# Patient Record
Sex: Male | Born: 1960 | Race: White | Hispanic: No | Marital: Single | Smoking: Current every day smoker
Health system: Southern US, Community
[De-identification: ages and names within clinical notes are randomized; demographics above are authoritative.]

## PROBLEM LIST (undated history)

## (undated) DIAGNOSIS — E785 Hyperlipidemia, unspecified: Secondary | ICD-10-CM

## (undated) DIAGNOSIS — D473 Essential (hemorrhagic) thrombocythemia: Secondary | ICD-10-CM

## (undated) DIAGNOSIS — F1010 Alcohol abuse, uncomplicated: Secondary | ICD-10-CM

## (undated) DIAGNOSIS — D649 Anemia, unspecified: Secondary | ICD-10-CM

## (undated) DIAGNOSIS — I10 Essential (primary) hypertension: Secondary | ICD-10-CM

## (undated) DIAGNOSIS — D75839 Thrombocytosis, unspecified: Secondary | ICD-10-CM

## (undated) DIAGNOSIS — J189 Pneumonia, unspecified organism: Secondary | ICD-10-CM

## (undated) DIAGNOSIS — I480 Paroxysmal atrial fibrillation: Secondary | ICD-10-CM

## (undated) DIAGNOSIS — I499 Cardiac arrhythmia, unspecified: Secondary | ICD-10-CM

## (undated) DIAGNOSIS — W868XXA Exposure to other electric current, initial encounter: Secondary | ICD-10-CM

## (undated) HISTORY — DX: Paroxysmal atrial fibrillation: I48.0

## (undated) HISTORY — DX: Thrombocytosis, unspecified: D75.839

## (undated) HISTORY — DX: Anemia, unspecified: D64.9

## (undated) HISTORY — PX: SKIN GRAFT: SHX250

## (undated) HISTORY — DX: Essential (hemorrhagic) thrombocythemia: D47.3

## (undated) HISTORY — DX: Alcohol abuse, uncomplicated: F10.10

---

## 2008-02-22 ENCOUNTER — Ambulatory Visit: Payer: Self-pay | Admitting: Internal Medicine

## 2017-03-01 ENCOUNTER — Emergency Department: Payer: 59

## 2017-03-01 ENCOUNTER — Other Ambulatory Visit: Payer: Self-pay

## 2017-03-01 ENCOUNTER — Inpatient Hospital Stay
Admission: EM | Admit: 2017-03-01 | Discharge: 2017-03-04 | DRG: 871 | Disposition: A | Discharge status: Home / Self Care | Payer: 59 | Attending: Internal Medicine | Admitting: Internal Medicine

## 2017-03-01 DIAGNOSIS — J189 Pneumonia, unspecified organism: Secondary | ICD-10-CM | POA: Diagnosis present

## 2017-03-01 DIAGNOSIS — J690 Pneumonitis due to inhalation of food and vomit: Secondary | ICD-10-CM | POA: Diagnosis present

## 2017-03-01 DIAGNOSIS — A419 Sepsis, unspecified organism: Principal | ICD-10-CM | POA: Diagnosis present

## 2017-03-01 DIAGNOSIS — R740 Nonspecific elevation of levels of transaminase and lactic acid dehydrogenase [LDH]: Secondary | ICD-10-CM | POA: Diagnosis present

## 2017-03-01 DIAGNOSIS — E871 Hypo-osmolality and hyponatremia: Secondary | ICD-10-CM

## 2017-03-01 DIAGNOSIS — Y95 Nosocomial condition: Secondary | ICD-10-CM | POA: Diagnosis present

## 2017-03-01 DIAGNOSIS — F17210 Nicotine dependence, cigarettes, uncomplicated: Secondary | ICD-10-CM | POA: Diagnosis present

## 2017-03-01 DIAGNOSIS — E43 Unspecified severe protein-calorie malnutrition: Secondary | ICD-10-CM

## 2017-03-01 DIAGNOSIS — J181 Lobar pneumonia, unspecified organism: Secondary | ICD-10-CM

## 2017-03-01 DIAGNOSIS — Z681 Body mass index (BMI) 19 or less, adult: Secondary | ICD-10-CM | POA: Diagnosis not present

## 2017-03-01 LAB — CBC WITH DIFFERENTIAL/PLATELET
BASOS ABS: 0 10*3/uL (ref 0–0.1)
Basophils Relative: 0 %
EOS ABS: 0 10*3/uL (ref 0–0.7)
EOS PCT: 0 %
HCT: 42.7 % (ref 40.0–52.0)
HEMOGLOBIN: 14.4 g/dL (ref 13.0–18.0)
LYMPHS PCT: 6 %
Lymphs Abs: 0.6 10*3/uL — ABNORMAL LOW (ref 1.0–3.6)
MCH: 32.7 pg (ref 26.0–34.0)
MCHC: 33.7 g/dL (ref 32.0–36.0)
MCV: 97 fL (ref 80.0–100.0)
Monocytes Absolute: 0.5 10*3/uL (ref 0.2–1.0)
Monocytes Relative: 4 %
NEUTROS PCT: 90 %
Neutro Abs: 9.3 10*3/uL — ABNORMAL HIGH (ref 1.4–6.5)
PLATELETS: 572 10*3/uL — AB (ref 150–440)
RBC: 4.4 MIL/uL (ref 4.40–5.90)
RDW: 13.3 % (ref 11.5–14.5)
WBC: 10.4 10*3/uL (ref 3.8–10.6)

## 2017-03-01 LAB — URINALYSIS, COMPLETE (UACMP) WITH MICROSCOPIC
Bacteria, UA: NONE SEEN
Bilirubin Urine: NEGATIVE
GLUCOSE, UA: NEGATIVE mg/dL
HGB URINE DIPSTICK: NEGATIVE
KETONES UR: NEGATIVE mg/dL
LEUKOCYTES UA: NEGATIVE
Nitrite: NEGATIVE
PROTEIN: NEGATIVE mg/dL
Specific Gravity, Urine: 1.025 (ref 1.005–1.030)
Squamous Epithelial / LPF: NONE SEEN
pH: 6 (ref 5.0–8.0)

## 2017-03-01 LAB — COMPREHENSIVE METABOLIC PANEL
ALBUMIN: 2.2 g/dL — AB (ref 3.5–5.0)
ALT: 48 U/L (ref 17–63)
ANION GAP: 7 (ref 5–15)
AST: 68 U/L — ABNORMAL HIGH (ref 15–41)
Alkaline Phosphatase: 97 U/L (ref 38–126)
BILIRUBIN TOTAL: 0.8 mg/dL (ref 0.3–1.2)
BUN: 12 mg/dL (ref 6–20)
CO2: 33 mmol/L — ABNORMAL HIGH (ref 22–32)
Calcium: 7.7 mg/dL — ABNORMAL LOW (ref 8.9–10.3)
Chloride: 83 mmol/L — ABNORMAL LOW (ref 101–111)
Creatinine, Ser: 0.58 mg/dL — ABNORMAL LOW (ref 0.61–1.24)
GFR calc Af Amer: >60 mL/min (ref >60)
GLUCOSE: 107 mg/dL — AB (ref 65–99)
POTASSIUM: 4.1 mmol/L (ref 3.5–5.1)
Sodium: 123 mmol/L — ABNORMAL LOW (ref 135–145)
TOTAL PROTEIN: 6.5 g/dL (ref 6.5–8.1)

## 2017-03-01 LAB — SODIUM
Sodium: 124 mmol/L — ABNORMAL LOW (ref 135–145)
Sodium: 122 mmol/L — ABNORMAL LOW (ref 135–145)

## 2017-03-01 LAB — LACTIC ACID, PLASMA
LACTIC ACID, VENOUS: 1.3 mmol/L (ref 0.5–1.9)
LACTIC ACID, VENOUS: 1 mmol/L (ref 0.5–1.9)
Lactic Acid, Venous: 3 mmol/L (ref 0.5–1.9)

## 2017-03-01 MED ORDER — SODIUM CHLORIDE 0.9 % IV SOLN
INTRAVENOUS | Status: DC
  Administered 2017-03-01: 19:00:00 via INTRAVENOUS

## 2017-03-01 MED ORDER — CEFEPIME HCL 1 G IJ SOLR
1.0000 g | Freq: Three times a day (TID) | INTRAMUSCULAR | Status: DC
  Administered 2017-03-01: 1 g via INTRAVENOUS
  Filled 2017-03-01 (×4): qty 1

## 2017-03-01 MED ORDER — ENOXAPARIN SODIUM 40 MG/0.4ML ~~LOC~~ SOLN
40.0000 mg | SUBCUTANEOUS | Status: DC
  Administered 2017-03-02 – 2017-03-03 (×2): 40 mg via SUBCUTANEOUS
  Filled 2017-03-01 (×3): qty 0.4

## 2017-03-01 MED ORDER — PIPERACILLIN-TAZOBACTAM 3.375 G IVPB 30 MIN
3.3750 g | Freq: Once | INTRAVENOUS | Status: AC
  Administered 2017-03-01: 3.375 g via INTRAVENOUS
  Filled 2017-03-01: qty 50

## 2017-03-01 MED ORDER — VANCOMYCIN HCL IN DEXTROSE 1-5 GM/200ML-% IV SOLN
1000.0000 mg | Freq: Once | INTRAVENOUS | Status: AC
  Administered 2017-03-01: 1000 mg via INTRAVENOUS
  Filled 2017-03-01: qty 200

## 2017-03-01 MED ORDER — BENZONATATE 100 MG PO CAPS
200.0000 mg | ORAL_CAPSULE | Freq: Three times a day (TID) | ORAL | Status: DC
  Administered 2017-03-01 – 2017-03-03 (×6): 200 mg via ORAL
  Filled 2017-03-01 (×8): qty 2

## 2017-03-01 MED ORDER — DEXTROSE 5 % IV SOLN: 500.0000 mg | Freq: Once | INTRAVENOUS | Status: DC

## 2017-03-01 MED ORDER — VANCOMYCIN HCL IN DEXTROSE 750-5 MG/150ML-% IV SOLN
750.0000 mg | Freq: Three times a day (TID) | INTRAVENOUS | Status: DC
  Administered 2017-03-01 – 2017-03-03 (×4): 750 mg via INTRAVENOUS
  Filled 2017-03-01 (×6): qty 150

## 2017-03-01 MED ORDER — CEFTRIAXONE SODIUM IN DEXTROSE 20 MG/ML IV SOLN: 1.0000 g | Freq: Once | INTRAVENOUS | Status: DC

## 2017-03-01 MED ORDER — SODIUM CHLORIDE 0.9 % IV SOLN
INTRAVENOUS | Status: DC
  Administered 2017-03-02: 04:00:00 via INTRAVENOUS

## 2017-03-01 MED ORDER — SODIUM CHLORIDE 0.9 % IV SOLN
Freq: Once | INTRAVENOUS | Status: AC
  Administered 2017-03-01: 16:00:00 via INTRAVENOUS

## 2017-03-01 MED ORDER — ALBUTEROL SULFATE (2.5 MG/3ML) 0.083% IN NEBU
2.5000 mg | INHALATION_SOLUTION | RESPIRATORY_TRACT | Status: DC | PRN
  Administered 2017-03-02: 2.5 mg via RESPIRATORY_TRACT
  Filled 2017-03-01: qty 3

## 2017-03-01 NOTE — H&P (Signed)
[facility] [place] NAME: Kristopher [name]    MR#:  [id number]  DATE OF BIRTH:  12-26-1960  DATE OF ADMISSION:  03/01/2017  PRIMARY CARE PHYSICIAN: Patient, No Pcp Per   REQUESTING/REFERRING PHYSICIAN: Archie Balboa  CHIEF COMPLAINT:   Shortness of breath HISTORY OF PRESENT ILLNESS:  Kristopher [name]  is a 56 y.o. male with a known history of recent diagnosis of pneumonia has completed moxifloxacin course but no clinical improvement. Patient is still having shortness of breath and came into the emergency department which has revealed persistent pneumonia. Patient's sodium is at 123 feeling weak and tired. Patient denies any exposure to contaminated or heated water recently. Reporting fever, cough and weakness  PAST MEDICAL HISTORY:  History reviewed. No pertinent past medical history.  PAST SURGICAL HISTOIRY:  History reviewed. No pertinent surgical history.  SOCIAL HISTORY:   Social History   Tobacco Use  . Smoking status: Current Every Day Smoker  Substance Use Topics  . Alcohol use: Yes    FAMILY HISTORY:  History reviewed. No pertinent family history.  DRUG ALLERGIES:  No Known Allergies  REVIEW OF SYSTEMS:  CONSTITUTIONAL: No fever, fatigue . Reporting generalized weakness EYES: No blurred or double vision.  EARS, NOSE, AND THROAT: No tinnitus or ear pain.  RESPIRATORY:  reporting worsening cough, shortness of breath,  denies wheezing or hemoptysis.  CARDIOVASCULAR: No chest pain, orthopnea, edema.  GASTROINTESTINAL: No nausea, vomiting, diarrhea or abdominal pain.  GENITOURINARY: No dysuria, hematuria.  ENDOCRINE: No polyuria, nocturia,  HEMATOLOGY: No anemia, easy bruising or bleeding SKIN: No rash or lesion. MUSCULOSKELETAL: No joint pain or arthritis.   NEUROLOGIC: No tingling, numbness, weakness.  PSYCHIATRY: No anxiety or depression.   MEDICATIONS AT HOME:   Prior to Admission medications   Medication Sig Start  Date End Date Taking? Authorizing Provider  benzonatate (TESSALON) 200 MG capsule Take 1 capsule by mouth 3 (three) times daily. 02/24/17  Yes [provider]  moxifloxacin (AVELOX) 400 MG tablet Take 400 mg by mouth daily. 02/24/17  Yes [provider]  predniSONE (DELTASONE) 20 MG tablet Take 20 mg by mouth 2 (two) times daily. 02/24/17  Yes [provider]  PROAIR HFA 108 (90 Base) MCG/ACT inhaler Inhale 2 puffs into the lungs every 6 (six) hours as needed. 02/24/17  Yes [provider]      VITAL SIGNS:  Blood pressure 114/69, pulse 81, temperature 97.8 F (36.6 C), temperature source Oral, resp. rate (!) 25, height 5\' 11"  (1.803 m), weight 63.5 kg (140 lb), SpO2 94 %.  PHYSICAL EXAMINATION:  GENERAL:  56 y.o.-year-old patient lying in the bed with no acute distress.  EYES: Pupils equal, round, reactive to light and accommodation. No scleral icterus. Extraocular muscles intact.  HEENT: Head atraumatic, normocephalic. Oropharynx and nasopharynx clear.  NECK:  Supple, no jugular venous distention. No thyroid enlargement, no tenderness.  LUNGS:  moderate breath sounds bilaterally with the crackling sounds , no wheezing, rales,rhonchi or crepitation. No use of accessory muscles of respiration.  CARDIOVASCULAR: S1, S2 normal. No murmurs, rubs, or gallops.  ABDOMEN: Soft, nontender, nondistended. Bowel sounds present. No organomegaly or mass.  EXTREMITIES: No pedal edema, cyanosis, or clubbing.  NEUROLOGIC: Cranial nerves II through XII are intact. Muscle strength 5/5 in all extremities. Sensation intact. Gait not checked.  PSYCHIATRIC: The patient is alert and oriented x 3.  SKIN: No obvious rash, lesion, or ulcer.   LABORATORY PANEL:   CBC Recent Labs  Lab 03/01/17 1339  WBC 10.4  HGB 14.4  HCT 42.7  PLT 572*   ------------------------------------------------------------------------------------------------------------------  Chemistries   Recent Labs  Lab 03/01/17 1339  NA 123*  K 4.1  CL 83*  CO2 33*  GLUCOSE 107*  BUN 12  CREATININE 0.58*  CALCIUM 7.7*  AST 68*  ALT 48  ALKPHOS 97  BILITOT 0.8   ------------------------------------------------------------------------------------------------------------------  Cardiac Enzymes No results for input(s): TROPONINI in the last 168 hours. ------------------------------------------------------------------------------------------------------------------  RADIOLOGY:  Dg Chest 2 View  Result Date: 03/01/2017 CLINICAL DATA:  56 year old recently diagnosed with right lower lobe pneumonia on 02/24/2017, presenting with worsening shortness of breath and generalized weakness despite treatment. Current smoker. EXAM: CHEST  2 VIEW COMPARISON:  Report of two-view chest x-ray from the [facility] on 02/24/2017 describing a right lower lobe pneumonia. FINDINGS: Cardiomediastinal silhouette unremarkable. Extensive airspace consolidation throughout the right lower lobe, associated with a moderate-sized right pleural effusion. No airspace consolidation elsewhere in either lung. Emphysematous changes throughout both lungs. No left pleural effusion. Visualized bony thorax intact. IMPRESSION: 1. Pneumonia involving the entire right lower lobe associated with a moderate-sized right parapneumonic effusion. 2.  Emphysema. ([id number]) Electronically Signed   By: Evangeline Dakin M.D.   On: 03/01/2017 14:29    EKG:  No orders found for this or any previous visit.  IMPRESSION AND PLAN:  Kristopher [name]  is a 56 y.o. male with a known history of recent diagnosis of pneumonia has completed moxifloxacin course but no clinical improvement. Patient is still having shortness of breath and came into the emergency department which has revealed persistent pneumonia. Patient's sodium is at 123 feeling weak and tired.   #  Sepsis secondary to Healthcare associated pneumonia Meets septic criteria with  fever and elevated lactic acid  Failed outpatient antibiotic moxifloxacin Patient is started on Zosyn and vancomycin in the emergency department will continue cefepime and vancomycin Check urine for Legionella antigen in view of hyponatremia Check streptococcal antibody Sputum culture and sensitivity Broncho-dilator therapy and IV fluids Follow lactic acid levels  #Hyponatremia could be from pneumonia possibly Legionella Hydrated with IV fluids and monitor serial sodiums. Patient is mentating fine at this time  #Generalized weakness Secondary to pneumonia Provide IV fluids Physical therapy  #Mildly elevated AST could be an acute phase reactants repeat a.m. Labs  #Thrombocytosis monitor platelet count closely  Provide GI and DVT prophylaxis  All the records are reviewed and case discussed with ED provider. Management plans discussed with the patient, family and they are in agreement.  CODE STATUS: fc ,daughter Cleta Alberts is HCPOA  TOTAL TIME TAKING CARE OF THIS PATIENT: 43 minutes.   Note: This dictation was prepared with Dragon dictation along with smaller phrase technology. Any transcriptional errors that result from this process are unintentional.  Nicholes Mango M.D on 03/01/2017 at 5:38 PM  Between 7am to 6pm - Pager - [phone]  After 6pm go to [email or web address] - password [email or web address] [facility]  Office  [phone]  CC: Primary care physician; Patient, No Pcp Per

## 2017-03-01 NOTE — Progress Notes (Signed)
MD on call text page to notify of lactic acid of 3.0 with no repeat labs ordered. Waiting for response. Will cont to monitor

## 2017-03-01 NOTE — ED Triage Notes (Signed)
Pt states Monday he was dx with penumonia at [facility], given antibiotics, inhaler, and " a few other capsules." states yesterday was supposed to have a FU appointment but states he couldn't get out of bed. Saw [facility] today and they did an xray and told him that pneumonia was worse and to come to ED. Pt is alert, oriented. Talking in complete sentences. Denies fever in last few days. States he hasn't missed a dose of antibiotic.

## 2017-03-01 NOTE — Progress Notes (Signed)
Pharmacy Antibiotic Note  Kristopher [name] is a 56 y.o. male admitted on 03/01/2017 with pneumonia.  Pharmacy has been consulted for vanc/cefepime dosing.  Plan: Patient received vanc 1g and zosyn 3.375g IV x 1 in ED  Will continue w/ vanc 750 mg IV q8h w/ 6 hour stack dose Will draw vanc trough 12/23 @ 2100 prior to 4th dose. Will start cefepime 1g IV q8h per Dr. Lennox Solders 0.0803 T1/2 8.6 ~ 8 hrs and decrease dose to 750 mg (15 mg/kg dose 1g) Css trough 18 mcg/mL prior to 4th dose Goal trough 15 - 20 mcg/mL  Height: 5\' 11"  (180.3 cm) Weight: 140 lb (63.5 kg) IBW/kg (Calculated) : 75.3  Temp (24hrs), Avg:98.5 F (36.9 C), Min:97.8 F (36.6 C), Max:99.1 F (37.3 C)  Recent Labs  Lab 03/01/17 1339 03/01/17 1548  WBC 10.4  --   CREATININE 0.58*  --   LATICACIDVEN 1.3 3.0*    Estimated Creatinine Clearance: 91.5 mL/min (A) (by C-G formula based on SCr of 0.58 mg/dL (L)).    No Known Allergies   Thank you for allowing pharmacy to be a part of this patient's care.  Tobie Lords, PharmD, BCPS Clinical Pharmacist 03/01/2017

## 2017-03-01 NOTE — ED Provider Notes (Signed)
[facility] Emergency Department Provider Note   ____________________________________________   I have reviewed the triage vital signs and the nursing notes.   HISTORY  Chief Complaint Pneumonia   History limited by: Not Limited   HPI Kristopher [name] is a 56 y.o. male who presents to the emergency department today because of concern for persistent pneumonia.   LOCATION:right lung DURATION:1.5 weeks TIMING: constant QUALITY: shortness of breath CONTEXT: patient was seen in [place] clinic 5 days ago for shortness of breath, weakness and diagnosed with pneumonia on x-ray. Patient was placed on Moxifloxacin. Has not felt significant improvement. Denies any exposure to contaminated, untreated or heated water recently. MODIFYING FACTORS: not improved on antibiotics. ASSOCIATED SYMPTOMS: fever, cough, weakness.  Per medical record review patient was seen on 12/17 and diagnosed with pneumonia.   History reviewed. No pertinent past medical history.  There are no active problems to display for this patient.   History reviewed. No pertinent surgical history.  Prior to Admission medications   Not on File    Allergies Patient has no known allergies.  History reviewed. No pertinent family history.  Social History Social History   Tobacco Use  . Smoking status: Current Every Day Smoker  Substance Use Topics  . Alcohol use: Yes  . Drug use: Not on file    Review of Systems Constitutional: Positive for fevers. Eyes: No visual changes. ENT: No sore throat. Cardiovascular: Denies chest pain. Respiratory: Positive for shortness of breath. Positive for cough. Gastrointestinal: No abdominal pain.  No nausea, no vomiting.  Positive for diarrhea. Genitourinary: Negative for dysuria. Musculoskeletal: Negative for back pain. Skin: Negative for rash. Neurological: Negative for headaches, focal weakness or  numbness.  ____________________________________________   PHYSICAL EXAM:  VITAL SIGNS: ED Triage Vitals [03/01/17 1340]  Enc Vitals Group     BP 126/81     Pulse Rate 82     Resp 18     Temp 97.8 F (36.6 C)     Temp Source Oral     SpO2 95 %     Weight 140 lb (63.5 kg)     Height 5\' 11"  (1.803 m)     Head Circumference      Peak Flow      Pain Score 0   Constitutional: Alert and oriented. Well appearing and in no distress. Eyes: Conjunctivae are normal.  ENT   Head: Normocephalic and atraumatic.   Nose: No congestion/rhinnorhea.   Mouth/Throat: Mucous membranes are moist.   Neck: No stridor. Hematological/Lymphatic/Immunilogical: No cervical lymphadenopathy. Cardiovascular: Normal rate, regular rhythm.  No murmurs, rubs, or gallops.  Respiratory: Normal respiratory effort without tachypnea nor retractions. Diminished breath sounds on the right lower lung.  Gastrointestinal: Soft and non tender. No rebound. No guarding.  Genitourinary: Deferred Musculoskeletal: Normal range of motion in all extremities. No lower extremity edema. Neurologic:  Normal speech and language. No gross focal neurologic deficits are appreciated.  Skin:  Skin is warm, dry and intact. No rash noted. Psychiatric: Mood and affect are normal. Speech and behavior are normal. Patient exhibits appropriate insight and judgment.  ____________________________________________    LABS (pertinent positives/negatives)  Lactic 1.3 CMP na 123, glu107, cr 0.58 CBC wbc 10.4, hgb 14.4, plt 572 UA not consistent with infection  ____________________________________________   EKG  None  ____________________________________________    RADIOLOGY  CXR Right lower lobe pneumonia  I, Adline Kirshenbaum, personally viewed and evaluated these images (plain radiographs) as part of my medical decision  making.  ____________________________________________   PROCEDURES  Procedures  ____________________________________________   INITIAL IMPRESSION / ASSESSMENT AND PLAN / ED COURSE  Pertinent labs & imaging results that were available during my care of the patient were reviewed by me and considered in my medical decision making (see chart for details).  Patient presents with worsening pneumonia. Blood work not consistent with sepsis. However given failure of oral antibiotics will plan on admission to the hospitalist service for further management. Discussed plan with patient.    ____________________________________________   FINAL CLINICAL IMPRESSION(S) / ED DIAGNOSES  Final diagnoses:  Community acquired pneumonia of right lower lobe of lung ([place])  Hyponatremia     Note: This dictation was prepared with Diplomatic Services operational officer dictation. Any transcriptional errors that result from this process are unintentional     Nance Pear, MD 03/01/17 [phone]

## 2017-03-02 ENCOUNTER — Inpatient Hospital Stay: Payer: 59

## 2017-03-02 LAB — BASIC METABOLIC PANEL
Anion gap: 7 (ref 5–15)
BUN: 8 mg/dL (ref 6–20)
CALCIUM: 7.2 mg/dL — AB (ref 8.9–10.3)
CO2: 26 mmol/L (ref 22–32)
Chloride: 92 mmol/L — ABNORMAL LOW (ref 101–111)
Creatinine, Ser: 0.34 mg/dL — ABNORMAL LOW (ref 0.61–1.24)
GFR calc Af Amer: >60 mL/min (ref >60)
GLUCOSE: 90 mg/dL (ref 65–99)
Potassium: 3.9 mmol/L (ref 3.5–5.1)
SODIUM: 125 mmol/L — AB (ref 135–145)

## 2017-03-02 LAB — CBC WITH DIFFERENTIAL/PLATELET
BASOS ABS: 0 10*3/uL (ref 0–0.1)
Basophils Relative: 0 %
EOS ABS: 0 10*3/uL (ref 0–0.7)
EOS PCT: 0 %
HCT: 38.7 % — ABNORMAL LOW (ref 40.0–52.0)
Hemoglobin: 13.5 g/dL (ref 13.0–18.0)
LYMPHS ABS: 0.6 10*3/uL — AB (ref 1.0–3.6)
LYMPHS PCT: 6 %
MCH: 33.3 pg (ref 26.0–34.0)
MCHC: 34.9 g/dL (ref 32.0–36.0)
MCV: 95.5 fL (ref 80.0–100.0)
MONO ABS: 0.8 10*3/uL (ref 0.2–1.0)
Monocytes Relative: 7 %
Neutro Abs: 9.3 10*3/uL — ABNORMAL HIGH (ref 1.4–6.5)
Neutrophils Relative %: 87 %
PLATELETS: 583 10*3/uL — AB (ref 150–440)
RBC: 4.05 MIL/uL — ABNORMAL LOW (ref 4.40–5.90)
RDW: 12.9 % (ref 11.5–14.5)
WBC: 10.7 10*3/uL — ABNORMAL HIGH (ref 3.8–10.6)

## 2017-03-02 LAB — EXPECTORATED SPUTUM ASSESSMENT W REFEX TO RESP CULTURE

## 2017-03-02 LAB — EXPECTORATED SPUTUM ASSESSMENT W GRAM STAIN, RFLX TO RESP C

## 2017-03-02 LAB — LACTIC ACID, PLASMA: Lactic Acid, Venous: 0.8 mmol/L (ref 0.5–1.9)

## 2017-03-02 LAB — STREP PNEUMONIAE URINARY ANTIGEN: STREP PNEUMO URINARY ANTIGEN: POSITIVE — AB

## 2017-03-02 LAB — MRSA PCR SCREENING: MRSA BY PCR: NEGATIVE

## 2017-03-02 MED ORDER — HYDROCOD POLST-CPM POLST ER 10-8 MG/5ML PO SUER
5.0000 mL | Freq: Two times a day (BID) | ORAL | Status: DC
  Administered 2017-03-02 (×2): 5 mL via ORAL
  Filled 2017-03-02 (×2): qty 5

## 2017-03-02 MED ORDER — SODIUM CHLORIDE 0.9 % IV SOLN
3.0000 g | Freq: Four times a day (QID) | INTRAVENOUS | Status: AC
  Administered 2017-03-02 – 2017-03-04 (×9): 3 g via INTRAVENOUS
  Filled 2017-03-02 (×11): qty 3

## 2017-03-02 MED ORDER — ADULT MULTIVITAMIN W/MINERALS CH
1.0000 | ORAL_TABLET | Freq: Every day | ORAL | Status: DC
  Administered 2017-03-03 – 2017-03-04 (×2): 1 via ORAL
  Filled 2017-03-02 (×2): qty 1

## 2017-03-02 MED ORDER — SODIUM CHLORIDE 0.9 % IV SOLN
INTRAVENOUS | Status: DC
  Administered 2017-03-02 – 2017-03-04 (×4): via INTRAVENOUS

## 2017-03-02 MED ORDER — ENSURE ENLIVE PO LIQD
237.0000 mL | Freq: Three times a day (TID) | ORAL | Status: DC
  Administered 2017-03-02 – 2017-03-04 (×6): 237 mL via ORAL

## 2017-03-02 MED ORDER — NICOTINE 21 MG/24HR TD PT24
21.0000 mg | MEDICATED_PATCH | Freq: Every day | TRANSDERMAL | Status: DC
  Administered 2017-03-02 – 2017-03-04 (×3): 21 mg via TRANSDERMAL
  Filled 2017-03-02 (×3): qty 1

## 2017-03-02 NOTE — Progress Notes (Signed)
[facility] at [place] NAME: Kristopher [name]    MR#:  [id number]  DATE OF BIRTH:  Aug 25, 1960  SUBJECTIVE:  CHIEF COMPLAINT:   Chief Complaint  Patient presents with  . Pneumonia    REVIEW OF SYSTEMS:  Review of Systems  Constitutional: Positive for malaise/fatigue. Negative for chills, fever and weight loss.  HENT: Negative for ear discharge, ear pain, hearing loss and nosebleeds.   Eyes: Negative for blurred vision, double vision and photophobia.  Respiratory: Positive for cough, hemoptysis and shortness of breath. Negative for wheezing.   Cardiovascular: Negative for chest pain, palpitations, orthopnea and leg swelling.  Gastrointestinal: Negative for abdominal pain, constipation, diarrhea, heartburn, melena, nausea and vomiting.  Genitourinary: Negative for dysuria and urgency.  Musculoskeletal: Positive for myalgias. Negative for back pain and neck pain.  Skin: Negative for rash.  Neurological: Negative for dizziness, tingling, sensory change, speech change, focal weakness and headaches.  Endo/Heme/Allergies: Does not bruise/bleed easily.  Psychiatric/Behavioral: Negative for depression.    DRUG ALLERGIES:  No Known Allergies  VITALS:  Blood pressure 131/74, pulse 74, temperature 100 F (37.8 C), temperature source Oral, resp. rate (!) 22, height 5\' 11"  (1.803 m), weight 63.5 kg (140 lb), SpO2 95 %.  PHYSICAL EXAMINATION:  Physical Exam  GENERAL:  56 y.o.-year-old thin built and ill nourished patient lying in the bed with no acute distress.  EYES: Pupils equal, round, reactive to light and accommodation. No scleral icterus. Extraocular muscles intact.  HEENT: Head atraumatic, normocephalic. Oropharynx and nasopharynx clear.  NECK:  Supple, no jugular venous distention. No thyroid enlargement, no tenderness.  LUNGS: Normal breath sounds bilaterally, but decreased at right base significantly - no wheezing, rales,rhonchi or  crepitation. No use of accessory muscles of respiration.  CARDIOVASCULAR: S1, S2 normal. No murmurs, rubs, or gallops.  ABDOMEN: Soft, nontender, nondistended. Bowel sounds present. No organomegaly or mass.  EXTREMITIES: No pedal edema, cyanosis, or clubbing.  NEUROLOGIC: Cranial nerves II through XII are intact. Muscle strength 5/5 in all extremities. Sensation intact. Gait not checked.  PSYCHIATRIC: The patient is alert and oriented x 3.  SKIN: No obvious rash, lesion, or ulcer.    LABORATORY PANEL:   CBC Recent Labs  Lab 03/02/17 0415  WBC 10.7*  HGB 13.5  HCT 38.7*  PLT 583*   ------------------------------------------------------------------------------------------------------------------  Chemistries  Recent Labs  Lab 03/01/17 1339  03/02/17 0415  NA 123*   < > 125*  K 4.1  --  3.9  CL 83*  --  92*  CO2 33*  --  26  GLUCOSE 107*  --  90  BUN 12  --  8  CREATININE 0.58*  --  0.34*  CALCIUM 7.7*  --  7.2*  AST 68*  --   --   ALT 48  --   --   ALKPHOS 97  --   --   BILITOT 0.8  --   --    < > = values in this interval not displayed.   ------------------------------------------------------------------------------------------------------------------  Cardiac Enzymes No results for input(s): TROPONINI in the last 168 hours. ------------------------------------------------------------------------------------------------------------------  RADIOLOGY:  Dg Chest 2 View  Result Date: 03/01/2017 CLINICAL DATA:  56 year old recently diagnosed with right lower lobe pneumonia on 02/24/2017, presenting with worsening shortness of breath and generalized weakness despite treatment. Current smoker. EXAM: CHEST  2 VIEW COMPARISON:  Report of two-view chest x-ray from the [facility] on 02/24/2017 describing a right lower lobe pneumonia. FINDINGS: Cardiomediastinal silhouette  unremarkable. Extensive airspace consolidation throughout the right lower lobe, associated with a  moderate-sized right pleural effusion. No airspace consolidation elsewhere in either lung. Emphysematous changes throughout both lungs. No left pleural effusion. Visualized bony thorax intact. IMPRESSION: 1. Pneumonia involving the entire right lower lobe associated with a moderate-sized right parapneumonic effusion. 2.  Emphysema. ([id number]) Electronically Signed   By: Evangeline Dakin M.D.   On: 03/01/2017 14:29   Ct Chest Wo Contrast  Result Date: 03/02/2017 CLINICAL DATA:  Extensive right lower lobe pneumonia. EXAM: CT CHEST WITHOUT CONTRAST TECHNIQUE: Multidetector CT imaging of the chest was performed following the standard protocol without IV contrast. COMPARISON:  Chest x-ray on 03/01/2017 FINDINGS: Cardiovascular: The heart size is normal. Coronary atherosclerosis present with densely calcified plaque in the distribution of the LAD and RCA. Calcified plaque also suspected in the distribution of the left circumflex coronary artery. No pericardial fluid. Central pulmonary arteries are normal in caliber. The thoracic aorta demonstrates mild calcified plaque without aneurysmal disease. Mediastinum/Nodes: Small nonenlarged precarinal lymph node anterior to the right mainstem bronchus measures 10 mm. No central airway obstruction. No esophageal abnormalities by CT. Lungs/Pleura: There is extensive necrotic pneumonia involving the entire right lower lobe with extensive necrosis and cavitation present. Within the necrotic right lower lobe, no discrete focal pulmonary abscess is identified. Associated small low-density parapneumonic effusion without obvious empyema or bronchopleural fistula. No obvious underlying mass lesion is seen. There are some areas of associated subpleural ground-glass opacity in the right middle lobe and anterior right upper lobe which may represent some additional areas of infection. Upper Abdomen: No acute abnormality. Musculoskeletal: No chest wall mass or suspicious bone lesions  identified. IMPRESSION: Extensive necrotic pneumonia involving the entire right lower lobe. No associated discrete pulmonary abscess. Small associated low density parapneumonic effusion without obvious empyema or bronchopleural fistula. Small areas of patchy subpleural ground-glass opacity in the right middle and upper lobes may represent additional areas of infection. Electronically Signed   By: Aletta Edouard M.D.   On: 03/02/2017 11:54    EKG:  No orders found for this or any previous visit.  ASSESSMENT AND PLAN:   56 year old male with no significant past medical history presents to the hospital secondary to worsening shortness of breath and failing outpatient treatment for pneumonia.  1. Right lower lobe pneumonia-with significant effusion. -Failed outpatient management. -Pending blood cultures. Continue vancomycin and cefepime today. -CT chest ordered to rule out empyema -Remains on room air at this time. '-Continue cough medications - HIV antibody ordered  2. Hyponatremia- urine Legionella antigen is pending. Continue IV fluids. Likely hypovolemic hyponatremia.  3. DVT prophylaxis-Lovenox  4. Moderate to severe malnutrition-we'll get dietary consult  5. Tobacco use disorder-we'll start nicotine patch  Physical therapy consult.   All the records are reviewed and case discussed with Care Management/Social Workerr. Management plans discussed with the patient, family and they are in agreement.  CODE STATUS: Full Code  TOTAL TIME TAKING CARE OF THIS PATIENT: 38 minutes.   POSSIBLE D/C IN 2 DAYS, DEPENDING ON CLINICAL CONDITION.   Gladstone Lighter M.D on 03/02/2017 at 12:06 PM  Between 7am to 6pm - Pager - [phone]  After 6pm go to [email or web address] - password [email or web address] [facility]  Office  [phone]  CC: Primary care physician; Patient, No Pcp Per

## 2017-03-02 NOTE — Progress Notes (Signed)
Initial Nutrition Assessment  DOCUMENTATION CODES:   Severe malnutrition in context of chronic illness, Underweight  INTERVENTION:  Provide Ensure Enlive po TID, each supplement provides 350 kcal and 20 grams of protein.  Provide MVI daily.  Encouraged adequate intake of calories and protein at meals.  NUTRITION DIAGNOSIS:   Severe Malnutrition related to chronic illness as evidenced by severe fat depletion, severe muscle depletion.  RD is unsure of the etiology related to severity of malnutrition at this time. However, patient meets criteria for severe chronic malnutrition due to severe fat and muscle depletion.  GOAL:   Patient will meet greater than or equal to 90% of their needs  MONITOR:   PO intake, Supplement acceptance, Labs, Weight trends, I & O's  REASON FOR ASSESSMENT:   Malnutrition Screening Tool, Consult Assessment of nutrition requirement/status  ASSESSMENT:   56 year old male with no significant PMHx presented for worsening shortness of breath and failing outpatient treatment of PNA found to have right lower lobe PNA with significant effusion, hyponatremia.   Met with patient at bedside. He reports his appetite has been poor for the past 3 weeks. He reports that he recently had the flu and then developed pneumonia. He has been unable to eat well and was only eating liquids lately. He reports he typically eats well, though is unable to provide any details on typical intake. He denies any N/V, abdominal pain, difficulty chewing/swallowing, or constipation/diarrhea.  Patient reports UBW of 140 lbs. RD obtained bed scale weight of 121.4 lbs. Patient is unsure when he might have lost weight.  Medications reviewed and include: NS @ 100 mL/hr, Unasyn, vancomycin.  Labs reviewed: Sodium 125, Chloride 92, Creatinine 0.34.  Discussed with RN.  NUTRITION - FOCUSED PHYSICAL EXAM:    Most Recent Value  Orbital Region  Severe depletion  Upper Arm Region  Severe  depletion  Thoracic and Lumbar Region  Severe depletion  Buccal Region  Severe depletion  Temple Region  Severe depletion  Clavicle Bone Region  Severe depletion  Clavicle and Acromion Bone Region  Severe depletion  Scapular Bone Region  Severe depletion  Dorsal Hand  Severe depletion  Patellar Region  Moderate depletion  Anterior Thigh Region  Moderate depletion  Posterior Calf Region  Moderate depletion  Edema (RD Assessment)  None  Hair  Reviewed  Eyes  Reviewed  Mouth  Reviewed  Skin  Reviewed  Nails  Reviewed     Diet Order:  Diet regular Room service appropriate? Yes; Fluid consistency: Thin  EDUCATION NEEDS:   No education needs have been identified at this time  Skin:  Skin Assessment: Reviewed RN Assessment  Last BM:  03/01/2017  Height:   Ht Readings from Last 1 Encounters:  03/01/17 5' 11"  (1.803 m)    Weight:   Wt Readings from Last 1 Encounters:  03/02/17 121 lb 6.4 oz (55.1 kg)    Ideal Body Weight:  78.2 kg  BMI:  Body mass index is 16.93 kg/m.  Estimated Nutritional Needs:   Kcal:  1680-1960 (MSJ x 1.2-1.4)  Protein:  83-95 grams (1.5-1.7 grams/kg)  Fluid:  1.7-1.9 L/day (30-35 mL/kg)  [name], MS, RD, LDN Office: [phone] Pager: [phone] After Hours/Weekend Pager: [phone]

## 2017-03-02 NOTE — Progress Notes (Signed)
ANTIBIOTIC CONSULT NOTE - INITIAL  Pharmacy Consult for Unasyn Indication: aspiration pneumonia   No Known Allergies  Patient Measurements: Height: 5\' 11"  (180.3 cm) Weight: 140 lb (63.5 kg) IBW/kg (Calculated) : 75.3 Adjusted Body Weight:   Vital Signs: Temp: 100 F (37.8 C) (12/23 0748) Temp Source: Oral (12/23 0748) BP: 131/74 (12/23 0748) Pulse Rate: 74 (12/23 0748) Intake/Output from previous day: 12/22 0701 - 12/23 0700 In: 855 [I.V.:655; IV Piggyback:200] Out: 800 [Urine:800] Intake/Output from this shift: Total I/O In: 240 [P.O.:240] Out: -   Labs: Recent Labs    03/01/17 1339 03/02/17 0415  WBC 10.4 10.7*  HGB 14.4 13.5  PLT 572* 583*  CREATININE 0.58* 0.34*   Estimated Creatinine Clearance: 91.5 mL/min (A) (by C-G formula based on SCr of 0.34 mg/dL (L)). No results for input(s): VANCOTROUGH, VANCOPEAK, VANCORANDOM, GENTTROUGH, GENTPEAK, GENTRANDOM, TOBRATROUGH, TOBRAPEAK, TOBRARND, AMIKACINPEAK, AMIKACINTROU, AMIKACIN in the last 72 hours.   Microbiology: Recent Results (from the past 720 hour(s))  Blood culture (routine x 2)     Status: None (Preliminary result)   Collection Time: 03/01/17  3:48 PM  Result Value Ref Range Status   Specimen Description BLOOD BLOOD RIGHT FOREARM  Final   Special Requests   Final    BOTTLES DRAWN AEROBIC AND ANAEROBIC Blood Culture adequate volume   Culture   Final    NO GROWTH < 24 HOURS Performed at [facility], [address] [place], [place] [address]    Report Status PENDING  Incomplete  Blood culture (routine x 2)     Status: None (Preliminary result)   Collection Time: 03/01/17  3:48 PM  Result Value Ref Range Status   Specimen Description BLOOD RIGHT ANTECUBITAL  Final   Special Requests   Final    BOTTLES DRAWN AEROBIC AND ANAEROBIC Blood Culture results may not be optimal due to an excessive volume of blood received in culture bottles   Culture   Final    NO GROWTH < 24 HOURS Performed at  [facility], [address], [place], [place] [address]    Report Status PENDING  Incomplete  Culture, sputum-assessment     Status: None   Collection Time: 03/01/17  3:48 PM  Result Value Ref Range Status   Specimen Description EXPECTORATED SPUTUM  Final   Special Requests NONE  Final   Sputum evaluation   Final    THIS SPECIMEN IS ACCEPTABLE FOR SPUTUM CULTURE Performed at [facility], [address], [place], [place] [address]    Report Status 03/02/2017 FINAL  Final  Culture, respiratory (NON-Expectorated)     Status: None (Preliminary result)   Collection Time: 03/01/17  3:48 PM  Result Value Ref Range Status   Specimen Description   Final    EXPECTORATED SPUTUM Performed at [facility], [address] [place], [place] [address]    Special Requests   Final    NONE Reflexed from [phone] Performed at [facility], [address] [place], [place] [address]    Gram Stain   Final    ABUNDANT WBC PRESENT, PREDOMINANTLY PMN RARE SQUAMOUS EPITHELIAL CELLS PRESENT FEW GRAM POSITIVE COCCI IN PAIRS IN CHAINS RARE GRAM POSITIVE RODS RARE BUDDING YEAST SEEN Performed at [facility], [place] [address] [place], [place] [address]    Culture PENDING  Incomplete   Report Status PENDING  Incomplete    Medical History: History reviewed. No pertinent past medical history.  Medications:  Medications Prior to Admission  Medication Sig Dispense Refill Last Dose  . benzonatate (TESSALON) 200 MG capsule Take 1 capsule by mouth 3 (three) times daily.  0 03/01/2017 at Unknown time  . moxifloxacin (AVELOX) 400 MG tablet Take 400 mg by mouth daily.  0 03/01/2017 at Unknown time  . predniSONE (DELTASONE) 20 MG tablet Take 20 mg by mouth 2 (two) times daily.  0 02/28/2017 at Unknown time  . PROAIR HFA 108 (90 Base) MCG/ACT inhaler Inhale 2 puffs into the lungs every 6 (six) hours as needed.  0 03/01/2017 at Unknown time   Scheduled:  . benzonatate   200 mg Oral TID  . chlorpheniramine-HYDROcodone  5 mL Oral Q12H  . enoxaparin (LOVENOX) injection  40 mg Subcutaneous Q24H  . nicotine  21 mg Transdermal Daily   Assessment: Pharmacy to dose and monitor Unasyn in this 56 year old male being treated for aspiration pneumonia.. Patient previously on Cefepime.   Goal of Therapy:    Plan:  Will start Unasyn 3 g IV q6 hours.   Karyl Sharrar D 03/02/2017,1:20 PM

## 2017-03-03 DIAGNOSIS — E43 Unspecified severe protein-calorie malnutrition: Secondary | ICD-10-CM

## 2017-03-03 LAB — BASIC METABOLIC PANEL
Anion gap: 7 (ref 5–15)
BUN: 8 mg/dL (ref 6–20)
CALCIUM: 7.2 mg/dL — AB (ref 8.9–10.3)
CO2: 26 mmol/L (ref 22–32)
CREATININE: 0.42 mg/dL — AB (ref 0.61–1.24)
Chloride: 94 mmol/L — ABNORMAL LOW (ref 101–111)
GLUCOSE: 99 mg/dL (ref 65–99)
Potassium: 3.7 mmol/L (ref 3.5–5.1)
SODIUM: 127 mmol/L — AB (ref 135–145)

## 2017-03-03 LAB — VANCOMYCIN, TROUGH: Vancomycin Tr: 11 ug/mL — ABNORMAL LOW (ref 15–20)

## 2017-03-03 MED ORDER — ACETAMINOPHEN 325 MG PO TABS
650.0000 mg | ORAL_TABLET | Freq: Four times a day (QID) | ORAL | Status: DC | PRN
  Administered 2017-03-03: 650 mg via ORAL
  Filled 2017-03-03: qty 2

## 2017-03-03 MED ORDER — GUAIFENESIN ER 600 MG PO TB12: 1200.0000 mg | ORAL_TABLET | Freq: Two times a day (BID) | ORAL | Status: DC

## 2017-03-03 MED ORDER — VANCOMYCIN HCL IN DEXTROSE 1-5 GM/200ML-% IV SOLN
1000.0000 mg | Freq: Three times a day (TID) | INTRAVENOUS | Status: DC
  Administered 2017-03-03: 1000 mg via INTRAVENOUS
  Filled 2017-03-03 (×2): qty 200

## 2017-03-03 MED ORDER — GUAIFENESIN-CODEINE 100-10 MG/5ML PO SOLN
10.0000 mL | Freq: Four times a day (QID) | ORAL | Status: DC
  Administered 2017-03-03 – 2017-03-04 (×4): 10 mL via ORAL
  Filled 2017-03-03 (×6): qty 10

## 2017-03-03 NOTE — Progress Notes (Addendum)
[place] [facility] [place] NAME: Kristopher [name]    MR#:  [id number]  DATE OF BIRTH:  [birth year]  SUBJECTIVE:  CHIEF COMPLAINT:   Chief Complaint  Patient presents with  . Pneumonia   - complains of cough, chest pain from coughing - on room air  REVIEW OF SYSTEMS:  Review of Systems  Constitutional: Positive for malaise/fatigue. Negative for chills, fever and weight loss.  HENT: Negative for ear discharge, ear pain, hearing loss and nosebleeds.   Eyes: Negative for blurred vision, double vision and photophobia.  Respiratory: Positive for cough and shortness of breath. Negative for hemoptysis and wheezing.   Cardiovascular: Negative for chest pain, palpitations, orthopnea and leg swelling.  Gastrointestinal: Negative for abdominal pain, constipation, diarrhea, heartburn, melena, nausea and vomiting.  Genitourinary: Negative for dysuria and urgency.  Musculoskeletal: Positive for myalgias. Negative for back pain and neck pain.  Skin: Negative for rash.  Neurological: Negative for dizziness, tingling, sensory change, speech change, focal weakness and headaches.  Endo/Heme/Allergies: Does not bruise/bleed easily.  Psychiatric/Behavioral: Negative for depression.    DRUG ALLERGIES:  No Known Allergies  VITALS:  Blood pressure 121/72, pulse 84, temperature 99.6 F (37.6 C), temperature source Oral, resp. rate 18, height 5\' 11"  (1.803 m), weight 55.1 kg (121 lb 6.4 oz), SpO2 94 %.  PHYSICAL EXAMINATION:  Physical Exam  GENERAL:  56 y.o.-year-old thin built and ill nourished patient lying in the bed with no acute distress.  EYES: Pupils equal, round, reactive to light and accommodation. No scleral icterus. Extraocular muscles intact.  HEENT: Head atraumatic, normocephalic. Oropharynx and nasopharynx clear.  NECK:  Supple, no jugular venous distention. No thyroid enlargement, no tenderness.  LUNGS: Normal breath sounds bilaterally, but decreased  at right base significantly - no wheezing, rales,rhonchi or crepitation. No use of accessory muscles of respiration.  CARDIOVASCULAR: S1, S2 normal. No murmurs, rubs, or gallops.  ABDOMEN: Soft, nontender, nondistended. Bowel sounds present. No organomegaly or mass.  EXTREMITIES: No pedal edema, cyanosis, or clubbing.  NEUROLOGIC: Cranial nerves II through XII are intact. Muscle strength 5/5 in all extremities. Sensation intact. Gait not checked.  PSYCHIATRIC: The patient is alert and oriented x 3.  SKIN: No obvious rash, lesion, or ulcer.    LABORATORY PANEL:   CBC Recent Labs  Lab 03/02/17 0415  WBC 10.7*  HGB 13.5  HCT 38.7*  PLT 583*   ------------------------------------------------------------------------------------------------------------------  Chemistries  Recent Labs  Lab 03/01/17 1339  03/03/17 0502  NA 123*   < > 127*  K 4.1   < > 3.7  CL 83*   < > 94*  CO2 33*   < > 26  GLUCOSE 107*   < > 99  BUN 12   < > 8  CREATININE 0.58*   < > 0.42*  CALCIUM 7.7*   < > 7.2*  AST 68*  --   --   ALT 48  --   --   ALKPHOS 97  --   --   BILITOT 0.8  --   --    < > = values in this interval not displayed.   ------------------------------------------------------------------------------------------------------------------  Cardiac Enzymes No results for input(s): TROPONINI in the last 168 hours. ------------------------------------------------------------------------------------------------------------------  RADIOLOGY:  Dg Chest 2 View  Result Date: 03/01/2017 CLINICAL DATA:  56 year old recently diagnosed with right lower lobe pneumonia on 02/24/2017, presenting with worsening shortness of breath and generalized weakness despite treatment. Current smoker. EXAM: CHEST  2 VIEW COMPARISON:  Report of two-view chest x-ray from the [facility] on 02/24/2017 describing a right lower lobe pneumonia. FINDINGS: Cardiomediastinal silhouette unremarkable. Extensive airspace  consolidation throughout the right lower lobe, associated with a moderate-sized right pleural effusion. No airspace consolidation elsewhere in either lung. Emphysematous changes throughout both lungs. No left pleural effusion. Visualized bony thorax intact. IMPRESSION: 1. Pneumonia involving the entire right lower lobe associated with a moderate-sized right parapneumonic effusion. 2.  Emphysema. ([id number]-C48.9) Electronically Signed   By: Evangeline Dakin M.D.   On: 03/01/2017 14:29   Ct Chest Wo Contrast  Result Date: 03/02/2017 CLINICAL DATA:  Extensive right lower lobe pneumonia. EXAM: CT CHEST WITHOUT CONTRAST TECHNIQUE: Multidetector CT imaging of the chest was performed following the standard protocol without IV contrast. COMPARISON:  Chest x-ray on 03/01/2017 FINDINGS: Cardiovascular: The heart size is normal. Coronary atherosclerosis present with densely calcified plaque in the distribution of the LAD and RCA. Calcified plaque also suspected in the distribution of the left circumflex coronary artery. No pericardial fluid. Central pulmonary arteries are normal in caliber. The thoracic aorta demonstrates mild calcified plaque without aneurysmal disease. Mediastinum/Nodes: Small nonenlarged precarinal lymph node anterior to the right mainstem bronchus measures 10 mm. No central airway obstruction. No esophageal abnormalities by CT. Lungs/Pleura: There is extensive necrotic pneumonia involving the entire right lower lobe with extensive necrosis and cavitation present. Within the necrotic right lower lobe, no discrete focal pulmonary abscess is identified. Associated small low-density parapneumonic effusion without obvious empyema or bronchopleural fistula. No obvious underlying mass lesion is seen. There are some areas of associated subpleural ground-glass opacity in the right middle lobe and anterior right upper lobe which may represent some additional areas of infection. Upper Abdomen: No acute abnormality.  Musculoskeletal: No chest wall mass or suspicious bone lesions identified. IMPRESSION: Extensive necrotic pneumonia involving the entire right lower lobe. No associated discrete pulmonary abscess. Small associated low density parapneumonic effusion without obvious empyema or bronchopleural fistula. Small areas of patchy subpleural ground-glass opacity in the right middle and upper lobes may represent additional areas of infection. Electronically Signed   By: Aletta Edouard M.D.   On: 03/02/2017 11:54    EKG:  No orders found for this or any previous visit.  ASSESSMENT AND PLAN:   56 year old male with no significant past medical history presents to the hospital secondary to worsening shortness of breath and failing outpatient treatment for pneumonia.  1. Right lower lobe pneumonia-with significant effusion. -Failed outpatient management. -negative blood cultures. Continue vancomycin and cefepime today. -CT chest with no empyema- necrotizing pneumonia- right base- changed antibiotics to unasyn (? Aspiration if alcoholic)- says he does drink alcohol -Remains on room air at this time. '-Continue cough medications - HIV antibody ordered  2. Hyponatremia- urine strep  antigen is positive. Continue IV fluids. Likely hypovolemic hyponatremia. - slowly improving  3. DVT prophylaxis-Lovenox  4. Moderate to severe malnutrition- dietary consult multivitamin daily  5. Tobacco use disorder- started nicotine patch   encourage ambulation   All the records are reviewed and case discussed with Care Management/Social Workerr. Management plans discussed with the patient, family and they are in agreement.  CODE STATUS: Full Code  TOTAL TIME TAKING CARE OF THIS PATIENT: 36 minutes.   POSSIBLE D/C TOMORROW, DEPENDING ON CLINICAL CONDITION.   Gladstone Lighter M.D on 03/03/2017 at 7:46 AM  Between 7am to 6pm - Pager - [phone]  After 6pm go to [email or web address] - password [email or web address] [facility]  Office  [phone]  CC:  Primary care physician; Patient, No Pcp Per

## 2017-03-03 NOTE — Progress Notes (Signed)
Pt.s temp 101.2 Oral MD on call paged and received verbal orders for tylenol 650 MG PRN Q6hrs. Order placed

## 2017-03-03 NOTE — Progress Notes (Signed)
Pharmacy Antibiotic Note  Kristopher [name] is a 56 y.o. male admitted on 03/01/2017 with pneumonia.  Pharmacy has been consulted for vanc/cefepime dosing.  Plan: Patient received vanc 1g and zosyn 3.375g IV x 1 in ED  Will continue w/ vanc 750 mg IV q8h w/ 6 hour stack dose Will draw vanc trough 12/23 @ 2100 prior to 4th dose. Will start cefepime 1g IV q8h per Dr. Lennox Solders 0.0803 T1/2 8.6 ~ 8 hrs and decrease dose to 750 mg (15 mg/kg dose 1g) Css trough 18 mcg/mL prior to 4th dose Goal trough 15 - 20 mcg/mL  12/24 @ 0500 VT 11 mcg/mL. Drawn appropriately, but the 12/23 0600 dose was missed, the 1400 dose was given 1600, but given that the renal function is improving Scr 0.58 >> 0.34 >> 0.42 will increase dose to 1g IV q8h w/ a vanc trough 12/25 @ 0500 and Css 15 mcg/mL. UOP over past 24 hrs 2.6L  Height: 5\' 11"  (180.3 cm) Weight: 121 lb 6.4 oz (55.1 kg)(bed scale) IBW/kg (Calculated) : 75.3  Temp (24hrs), Avg:99.3 F (37.4 C), Min:98.2 F (36.8 C), Max:100 F (37.8 C)  Recent Labs  Lab 03/01/17 1339 03/01/17 1548 03/01/17 2258 03/02/17 0124 03/02/17 0415 03/03/17 0502  WBC 10.4  --   --   --  10.7*  --   CREATININE 0.58*  --   --   --  0.34* 0.42*  LATICACIDVEN 1.3 3.0* 1.0 0.8  --   --   VANCOTROUGH  --   --   --   --   --  11*    Estimated Creatinine Clearance: 79.4 mL/min (A) (by C-G formula based on SCr of 0.42 mg/dL (L)).    No Known Allergies   Thank you for allowing pharmacy to be a part of this patient's care.  Tobie Lords, PharmD, BCPS Clinical Pharmacist 03/03/2017

## 2017-03-04 ENCOUNTER — Inpatient Hospital Stay: Payer: 59

## 2017-03-04 LAB — BASIC METABOLIC PANEL
ANION GAP: 7 (ref 5–15)
BUN: 7 mg/dL (ref 6–20)
CALCIUM: 7.1 mg/dL — AB (ref 8.9–10.3)
CO2: 28 mmol/L (ref 22–32)
CREATININE: 0.41 mg/dL — AB (ref 0.61–1.24)
Chloride: 94 mmol/L — ABNORMAL LOW (ref 101–111)
GFR calc Af Amer: >60 mL/min (ref >60)
GLUCOSE: 111 mg/dL — AB (ref 65–99)
Potassium: 3.9 mmol/L (ref 3.5–5.1)
Sodium: 129 mmol/L — ABNORMAL LOW (ref 135–145)

## 2017-03-04 LAB — CULTURE, RESPIRATORY W GRAM STAIN: Culture: NORMAL

## 2017-03-04 LAB — LEGIONELLA PNEUMOPHILA SEROGP 1 UR AG: L. PNEUMOPHILA SEROGP 1 UR AG: NEGATIVE

## 2017-03-04 LAB — HIV ANTIBODY (ROUTINE TESTING W REFLEX)
HIV SCREEN 4TH GENERATION: NONREACTIVE
HIV Screen 4th Generation wRfx: NONREACTIVE

## 2017-03-04 MED ORDER — AMOXICILLIN-POT CLAVULANATE 875-125 MG PO TABS
1.0000 | ORAL_TABLET | Freq: Two times a day (BID) | ORAL | Status: DC
  Administered 2017-03-04: 1 via ORAL
  Filled 2017-03-04: qty 1

## 2017-03-04 MED ORDER — BENZONATATE 200 MG PO CAPS: 200.0000 mg | ORAL_CAPSULE | Freq: Three times a day (TID) | ORAL | 0 refills | Status: DC

## 2017-03-04 MED ORDER — AMOXICILLIN-POT CLAVULANATE 875-125 MG PO TABS: 1.0000 | ORAL_TABLET | Freq: Two times a day (BID) | ORAL | 0 refills | Status: DC

## 2017-03-04 NOTE — Progress Notes (Signed)
DISCHARGE NOTE:  Pt given discharge instructions, work note and prescriptions. Pt verbalized understanding. Pt wheeled to car by staff.

## 2017-03-04 NOTE — Progress Notes (Signed)
     Kristopher [name] was admitted to the [facility] on 03/01/2017 for an acute medical condition and is being Discharged on  03/04/2017 . He is still under treatment and will need atleast another 2-3 weekss for recovery and so advised to stay away from work until then. So please excuse him from work  for above Days. Should be able to return to work after following up with his physician in 2 weeks  Call Gladstone Lighter  MD, [facility] at  [phone] with questions.  Gladstone Lighter M.D on 03/04/2017,at 1:30 PM  [facility] [address], [place] [address]

## 2017-03-06 LAB — CULTURE, BLOOD (ROUTINE X 2)
CULTURE: NO GROWTH
Culture: NO GROWTH
SPECIAL REQUESTS: ADEQUATE

## 2017-03-10 NOTE — Discharge Summary (Signed)
[facility] [place] NAME: Kristopher [name]    MR#:  [id number]  DATE OF BIRTH:  September 18, 1960  DATE OF ADMISSION:  03/01/2017   ADMITTING PHYSICIAN: Nicholes Mango, MD  DATE OF DISCHARGE: 03/04/2017  6:26 PM  PRIMARY CARE PHYSICIAN: Patient, No Pcp Per   ADMISSION DIAGNOSIS:   Hyponatremia [E87.1] Community acquired pneumonia of right lower lobe of lung ([place]) [J18.1]  DISCHARGE DIAGNOSIS:   Active Problems:   HCAP (healthcare-associated pneumonia)   Protein-calorie malnutrition, severe   SECONDARY DIAGNOSIS:   History reviewed. No pertinent past medical history.  HOSPITAL COURSE:   56 year old male with no significant past medical history presents to the hospital secondary to worsening shortness of breath and failing outpatient treatment for pneumonia.  1. Right lower lobe pneumonia-with significant effusion. -Failed outpatient management. -negative blood cultures. Continue vancomycin and cefepime while in the hospital- vancomycin discontinued as MRSA pcr was negative and ABX were changed to IV unasyn for possible aspiration as well.. -CT chest with no empyema- necrotizing pneumonia- right base- (? Aspiration if alcoholic)- says he does drink alcohol -Remains on room air at this time. '-Continue cough medications - HIV antibody negative - discharged on augmentin until seen by ID as outpatient in 10 days  2. Hyponatremia- received IV fluids with improvement. urine strep  antigen is positive.  hypovolemic hyponatremia.  3. Moderate to severe malnutrition- dietary consult multivitamin daily  4. Tobacco use disorder- counseled and was on nicotine patch  Has been ambulating well without any distress. Will be discharged today   DISCHARGE CONDITIONS:   Guarded  CONSULTS OBTAINED:   Treatment Team:  Leonel Ramsay, MD  DRUG ALLERGIES:   No Known Allergies DISCHARGE MEDICATIONS:   Allergies as of 03/04/2017   No  Known Allergies     Medication List    STOP taking these medications   moxifloxacin 400 MG tablet Commonly known as:  AVELOX   predniSONE 20 MG tablet Commonly known as:  DELTASONE     TAKE these medications   amoxicillin-clavulanate 875-125 MG tablet Commonly known as:  AUGMENTIN Take 1 tablet by mouth 2 (two) times daily. X 14 days   benzonatate 200 MG capsule Commonly known as:  TESSALON Take 1 capsule (200 mg total) by mouth 3 (three) times daily.   PROAIR HFA 108 (90 Base) MCG/ACT inhaler Generic drug:  albuterol Inhale 2 puffs into the lungs every 6 (six) hours as needed.        DISCHARGE INSTRUCTIONS:   1. Recommended to follow up with Dr. Ola Spurr from ID in 1 week  DIET:   Regular diet  ACTIVITY:   As tolerated  OXYGEN:   Home Oxygen: None  Oxygen Delivery: Room air  DISCHARGE LOCATION:   Home  If you experience worsening of your admission symptoms, develop shortness of breath, life threatening emergency, suicidal or homicidal thoughts you must seek medical attention immediately by calling 911 or calling your MD immediately  if symptoms less severe.  You Must read complete instructions/literature along with all the possible adverse reactions/side effects for all the Medicines you take and that have been prescribed to you. Take any new Medicines after you have completely understood and accpet all the possible adverse reactions/side effects.   Please note  You were cared for by a hospitalist during your hospital stay. If you have any questions about your discharge medications or the care you received while you were in the hospital after you are  discharged, you can call the unit and asked to speak with the hospitalist on call if the hospitalist that took care of you is not available. Once you are discharged, your primary care physician will handle any further medical issues. Please note that NO REFILLS for any discharge medications will be authorized  once you are discharged, as it is imperative that you return to your primary care physician (or establish a relationship with a primary care physician if you do not have one) for your aftercare needs so that they can reassess your need for medications and monitor your lab values.    On the day of Discharge:  VITAL SIGNS:   Blood pressure (!) 142/79, pulse 81, temperature 99.4 F (37.4 C), temperature source Oral, resp. rate 18, height 5\' 11"  (1.803 m), weight 55.1 kg (121 lb 6.4 oz), SpO2 94 %.  PHYSICAL EXAMINATION:     GENERAL:  56 y.o.-year-old thin built and ill nourished patient lying in the bed with no acute distress.  EYES: Pupils equal, round, reactive to light and accommodation. No scleral icterus. Extraocular muscles intact.  HEENT: Head atraumatic, normocephalic. Oropharynx and nasopharynx clear.  NECK:  Supple, no jugular venous distention. No thyroid enlargement, no tenderness.  LUNGS: Normal breath sounds bilaterally, but decreased at right base significantly - no wheezing, rales,rhonchi or crepitation. No use of accessory muscles of respiration.  CARDIOVASCULAR: S1, S2 normal. No murmurs, rubs, or gallops.  ABDOMEN: Soft, nontender, nondistended. Bowel sounds present. No organomegaly or mass.  EXTREMITIES: No pedal edema, cyanosis. Clubbing of fingers noted.Marland Kitchen  NEUROLOGIC: Cranial nerves II through XII are intact. Muscle strength 5/5 in all extremities. Sensation intact. Gait not checked.  PSYCHIATRIC: The patient is alert and oriented x 3.  SKIN: No obvious rash, lesion, or ulcer.     DATA REVIEW:   CBC No results for input(s): WBC, HGB, HCT, PLT in the last 168 hours.  Chemistries  Recent Labs  Lab 03/04/17 0321  NA 129*  K 3.9  CL 94*  CO2 28  GLUCOSE 111*  BUN 7  CREATININE 0.41*  CALCIUM 7.1*     Microbiology Results  Results for orders placed or performed during the hospital encounter of 03/01/17  Blood culture (routine x 2)     Status: None    Collection Time: 03/01/17  3:48 PM  Result Value Ref Range Status   Specimen Description BLOOD BLOOD RIGHT FOREARM  Final   Special Requests   Final    BOTTLES DRAWN AEROBIC AND ANAEROBIC Blood Culture adequate volume   Culture   Final    NO GROWTH 5 DAYS Performed at [facility], [address] [place], [place] [address]    Report Status 03/06/2017 FINAL  Final  Blood culture (routine x 2)     Status: None   Collection Time: 03/01/17  3:48 PM  Result Value Ref Range Status   Specimen Description BLOOD RIGHT ANTECUBITAL  Final   Special Requests   Final    BOTTLES DRAWN AEROBIC AND ANAEROBIC Blood Culture results may not be optimal due to an excessive volume of blood received in culture bottles   Culture   Final    NO GROWTH 5 DAYS Performed at [facility], [address], [place], [place] [address]    Report Status 03/06/2017 FINAL  Final  Culture, sputum-assessment     Status: None   Collection Time: 03/01/17  3:48 PM  Result Value Ref Range Status   Specimen Description EXPECTORATED SPUTUM  Final   Special Requests NONE  Final   Sputum evaluation   Final    THIS SPECIMEN IS ACCEPTABLE FOR SPUTUM CULTURE Performed at [facility], [address] [place], [place] [address]    Report Status 03/02/2017 FINAL  Final  Culture, respiratory (NON-Expectorated)     Status: None   Collection Time: 03/01/17  3:48 PM  Result Value Ref Range Status   Specimen Description   Final    EXPECTORATED SPUTUM Performed at [facility], [address] [place], [place] [address]    Special Requests   Final    NONE Reflexed from [id number] Performed at [facility], [address] [place], [place] [address]    Gram Stain   Final    ABUNDANT WBC PRESENT, PREDOMINANTLY PMN RARE SQUAMOUS EPITHELIAL CELLS PRESENT FEW GRAM POSITIVE COCCI IN PAIRS IN CHAINS RARE GRAM POSITIVE RODS RARE BUDDING YEAST SEEN    Culture   Final    MODERATE  Consistent with normal respiratory flora. Performed at [facility], [place] [address] [place], [place] [address]    Report Status 03/04/2017 FINAL  Final  MRSA PCR Screening     Status: None   Collection Time: 03/02/17  3:56 PM  Result Value Ref Range Status   MRSA by PCR NEGATIVE NEGATIVE Final    Comment:        The GeneXpert MRSA Assay (FDA approved for NASAL specimens only), is one component of a comprehensive MRSA colonization surveillance program. It is not intended to diagnose MRSA infection nor to guide or monitor treatment for MRSA infections. Performed at [facility], [address], [place], [place] [address]     RADIOLOGY:  No results found.   Management plans discussed with the patient, family and they are in agreement.  CODE STATUS:  Code Status History    Date Active Date Inactive Code Status Order ID Comments User Context   03/01/2017 17:58 03/04/2017 21:32 Full Code [id number]  Nicholes Mango, MD Inpatient      TOTAL TIME TAKING CARE OF THIS PATIENT: 37 minutes.    Gladstone Lighter M.D on 03/10/2017 at 6:39 PM  Between 7am to 6pm - Pager - [phone]  After 6pm go to [email or web address] - Proofreader  [employer]  [facility]ospitalists  Office  [phone]  CC: Primary care physician; Patient, No Pcp Per   Note: This dictation was prepared with Dragon dictation along with smaller phrase technology. Any transcriptional errors that result from this process are unintentional.

## 2017-03-28 ENCOUNTER — Emergency Department: Payer: 59

## 2017-03-28 ENCOUNTER — Other Ambulatory Visit: Payer: Self-pay

## 2017-03-28 ENCOUNTER — Inpatient Hospital Stay
Admission: EM | Admit: 2017-03-28 | Discharge: 2017-04-11 | DRG: 163 | Disposition: A | Discharge status: Home / Self Care | Payer: 59 | Attending: Internal Medicine | Admitting: Internal Medicine

## 2017-03-28 DIAGNOSIS — Z09 Encounter for follow-up examination after completed treatment for conditions other than malignant neoplasm: Secondary | ICD-10-CM

## 2017-03-28 DIAGNOSIS — F1010 Alcohol abuse, uncomplicated: Secondary | ICD-10-CM | POA: Diagnosis present

## 2017-03-28 DIAGNOSIS — J850 Gangrene and necrosis of lung: Secondary | ICD-10-CM | POA: Diagnosis not present

## 2017-03-28 DIAGNOSIS — R64 Cachexia: Secondary | ICD-10-CM | POA: Diagnosis present

## 2017-03-28 DIAGNOSIS — D649 Anemia, unspecified: Secondary | ICD-10-CM | POA: Diagnosis present

## 2017-03-28 DIAGNOSIS — J440 Chronic obstructive pulmonary disease with acute lower respiratory infection: Secondary | ICD-10-CM | POA: Diagnosis present

## 2017-03-28 DIAGNOSIS — J869 Pyothorax without fistula: Principal | ICD-10-CM

## 2017-03-28 DIAGNOSIS — Z681 Body mass index (BMI) 19 or less, adult: Secondary | ICD-10-CM

## 2017-03-28 DIAGNOSIS — I480 Paroxysmal atrial fibrillation: Secondary | ICD-10-CM | POA: Diagnosis not present

## 2017-03-28 DIAGNOSIS — J939 Pneumothorax, unspecified: Secondary | ICD-10-CM | POA: Diagnosis present

## 2017-03-28 DIAGNOSIS — J852 Abscess of lung without pneumonia: Secondary | ICD-10-CM | POA: Diagnosis present

## 2017-03-28 DIAGNOSIS — E43 Unspecified severe protein-calorie malnutrition: Secondary | ICD-10-CM | POA: Diagnosis not present

## 2017-03-28 DIAGNOSIS — I4891 Unspecified atrial fibrillation: Secondary | ICD-10-CM | POA: Diagnosis not present

## 2017-03-28 DIAGNOSIS — Z938 Other artificial opening status: Secondary | ICD-10-CM

## 2017-03-28 DIAGNOSIS — Z720 Tobacco use: Secondary | ICD-10-CM | POA: Diagnosis not present

## 2017-03-28 DIAGNOSIS — J189 Pneumonia, unspecified organism: Secondary | ICD-10-CM | POA: Diagnosis present

## 2017-03-28 DIAGNOSIS — E860 Dehydration: Secondary | ICD-10-CM | POA: Diagnosis present

## 2017-03-28 DIAGNOSIS — Z885 Allergy status to narcotic agent status: Secondary | ICD-10-CM

## 2017-03-28 DIAGNOSIS — R0600 Dyspnea, unspecified: Secondary | ICD-10-CM

## 2017-03-28 DIAGNOSIS — Z8701 Personal history of pneumonia (recurrent): Secondary | ICD-10-CM

## 2017-03-28 DIAGNOSIS — Z87891 Personal history of nicotine dependence: Secondary | ICD-10-CM

## 2017-03-28 DIAGNOSIS — E222 Syndrome of inappropriate secretion of antidiuretic hormone: Secondary | ICD-10-CM | POA: Diagnosis present

## 2017-03-28 DIAGNOSIS — Y95 Nosocomial condition: Secondary | ICD-10-CM | POA: Diagnosis present

## 2017-03-28 DIAGNOSIS — Z9689 Presence of other specified functional implants: Secondary | ICD-10-CM

## 2017-03-28 DIAGNOSIS — J9811 Atelectasis: Secondary | ICD-10-CM | POA: Diagnosis present

## 2017-03-28 DIAGNOSIS — J851 Abscess of lung with pneumonia: Secondary | ICD-10-CM | POA: Diagnosis not present

## 2017-03-28 DIAGNOSIS — J449 Chronic obstructive pulmonary disease, unspecified: Secondary | ICD-10-CM | POA: Diagnosis not present

## 2017-03-28 DIAGNOSIS — J9600 Acute respiratory failure, unspecified whether with hypoxia or hypercapnia: Secondary | ICD-10-CM

## 2017-03-28 DIAGNOSIS — J9382 Other air leak: Secondary | ICD-10-CM | POA: Diagnosis not present

## 2017-03-28 DIAGNOSIS — J9690 Respiratory failure, unspecified, unspecified whether with hypoxia or hypercapnia: Secondary | ICD-10-CM

## 2017-03-28 HISTORY — DX: Pneumonia, unspecified organism: J18.9

## 2017-03-28 HISTORY — DX: Exposure to other electric current, initial encounter: W86.8XXA

## 2017-03-28 LAB — COMPREHENSIVE METABOLIC PANEL
ALT: 25 U/L (ref 17–63)
ANION GAP: 13 (ref 5–15)
AST: 36 U/L (ref 15–41)
Albumin: 2.7 g/dL — ABNORMAL LOW (ref 3.5–5.0)
Alkaline Phosphatase: 107 U/L (ref 38–126)
BUN: 6 mg/dL (ref 6–20)
CHLORIDE: 88 mmol/L — AB (ref 101–111)
CO2: 26 mmol/L (ref 22–32)
Calcium: 8.7 mg/dL — ABNORMAL LOW (ref 8.9–10.3)
Creatinine, Ser: 0.45 mg/dL — ABNORMAL LOW (ref 0.61–1.24)
GFR calc non Af Amer: >60 mL/min (ref >60)
Glucose, Bld: 116 mg/dL — ABNORMAL HIGH (ref 65–99)
Potassium: 4.4 mmol/L (ref 3.5–5.1)
SODIUM: 127 mmol/L — AB (ref 135–145)
Total Bilirubin: 0.9 mg/dL (ref 0.3–1.2)
Total Protein: 7.8 g/dL (ref 6.5–8.1)

## 2017-03-28 LAB — LACTIC ACID, PLASMA: Lactic Acid, Venous: 1.3 mmol/L (ref 0.5–1.9)

## 2017-03-28 LAB — CBC
HCT: 36.6 % — ABNORMAL LOW (ref 40.0–52.0)
Hemoglobin: 12.2 g/dL — ABNORMAL LOW (ref 13.0–18.0)
MCH: 30.5 pg (ref 26.0–34.0)
MCHC: 33.4 g/dL (ref 32.0–36.0)
MCV: 91.3 fL (ref 80.0–100.0)
PLATELETS: 644 10*3/uL — AB (ref 150–440)
RBC: 4.01 MIL/uL — ABNORMAL LOW (ref 4.40–5.90)
RDW: 13.9 % (ref 11.5–14.5)
WBC: 15.9 10*3/uL — ABNORMAL HIGH (ref 3.8–10.6)

## 2017-03-28 LAB — TROPONIN I: Troponin I: <0.03 ng/mL

## 2017-03-28 MED ORDER — ACETAMINOPHEN 325 MG PO TABS: 650.0000 mg | ORAL_TABLET | Freq: Four times a day (QID) | ORAL | Status: DC | PRN

## 2017-03-28 MED ORDER — ONDANSETRON HCL 4 MG PO TABS: 4.0000 mg | ORAL_TABLET | Freq: Four times a day (QID) | ORAL | Status: DC | PRN

## 2017-03-28 MED ORDER — SODIUM CHLORIDE 0.9 % IV BOLUS (SEPSIS)
500.0000 mL | Freq: Once | INTRAVENOUS | Status: AC
  Administered 2017-03-28: 500 mL via INTRAVENOUS

## 2017-03-28 MED ORDER — IBUPROFEN 400 MG PO TABS
400.0000 mg | ORAL_TABLET | Freq: Four times a day (QID) | ORAL | Status: DC | PRN
  Administered 2017-03-28: 400 mg via ORAL
  Filled 2017-03-28: qty 1

## 2017-03-28 MED ORDER — FOLIC ACID 1 MG PO TABS
1.0000 mg | ORAL_TABLET | Freq: Every day | ORAL | Status: DC
  Administered 2017-03-28 – 2017-04-02 (×5): 1 mg via ORAL
  Filled 2017-03-28 (×5): qty 1

## 2017-03-28 MED ORDER — DEXTROSE 5 % IV SOLN
1.0000 g | Freq: Once | INTRAVENOUS | Status: AC
  Administered 2017-03-28: 1 g via INTRAVENOUS
  Filled 2017-03-28: qty 1

## 2017-03-28 MED ORDER — VITAMIN B-1 100 MG PO TABS
100.0000 mg | ORAL_TABLET | Freq: Every day | ORAL | Status: DC
  Administered 2017-03-28 – 2017-04-02 (×5): 100 mg via ORAL
  Filled 2017-03-28 (×5): qty 1

## 2017-03-28 MED ORDER — ACETAMINOPHEN 650 MG RE SUPP: 650.0000 mg | Freq: Four times a day (QID) | RECTAL | Status: DC | PRN

## 2017-03-28 MED ORDER — LORAZEPAM 2 MG/ML IJ SOLN: 1.0000 mg | Freq: Four times a day (QID) | INTRAMUSCULAR | Status: DC | PRN

## 2017-03-28 MED ORDER — SODIUM CHLORIDE 0.9 % IV SOLN
INTRAVENOUS | Status: DC
  Administered 2017-03-28 – 2017-03-29 (×2): via INTRAVENOUS

## 2017-03-28 MED ORDER — VANCOMYCIN HCL IN DEXTROSE 1-5 GM/200ML-% IV SOLN
1000.0000 mg | Freq: Once | INTRAVENOUS | Status: AC
Start: 2017-03-28 — End: 2017-03-29
  Administered 2017-03-28: 1000 mg via INTRAVENOUS
  Filled 2017-03-28: qty 200

## 2017-03-28 MED ORDER — ADULT MULTIVITAMIN W/MINERALS CH
1.0000 | ORAL_TABLET | Freq: Every day | ORAL | Status: DC
  Administered 2017-03-28 – 2017-04-02 (×5): 1 via ORAL
  Filled 2017-03-28 (×5): qty 1

## 2017-03-28 MED ORDER — ENOXAPARIN SODIUM 40 MG/0.4ML ~~LOC~~ SOLN
40.0000 mg | SUBCUTANEOUS | Status: DC
  Administered 2017-03-28 – 2017-03-30 (×3): 40 mg via SUBCUTANEOUS
  Filled 2017-03-28 (×3): qty 0.4

## 2017-03-28 MED ORDER — ONDANSETRON HCL 4 MG/2ML IJ SOLN
4.0000 mg | Freq: Four times a day (QID) | INTRAMUSCULAR | Status: DC | PRN
  Administered 2017-04-03: 4 mg via INTRAVENOUS

## 2017-03-28 MED ORDER — THIAMINE HCL 100 MG/ML IJ SOLN: 100.0000 mg | Freq: Every day | INTRAMUSCULAR | Status: DC

## 2017-03-28 MED ORDER — PIPERACILLIN-TAZOBACTAM 3.375 G IVPB
3.3750 g | Freq: Three times a day (TID) | INTRAVENOUS | Status: DC
  Administered 2017-03-28 – 2017-04-03 (×18): 3.375 g via INTRAVENOUS
  Filled 2017-03-28 (×17): qty 50

## 2017-03-28 MED ORDER — LORAZEPAM 1 MG PO TABS
1.0000 mg | ORAL_TABLET | Freq: Four times a day (QID) | ORAL | Status: DC | PRN
  Administered 2017-03-28: 1 mg via ORAL
  Filled 2017-03-28: qty 1

## 2017-03-28 MED ORDER — GUAIFENESIN ER 600 MG PO TB12
600.0000 mg | ORAL_TABLET | Freq: Two times a day (BID) | ORAL | Status: DC
  Administered 2017-03-28: 600 mg via ORAL
  Filled 2017-03-28: qty 1

## 2017-03-28 NOTE — ED Notes (Signed)
Pt given sandwich tray 

## 2017-03-28 NOTE — ED Notes (Signed)
Pt reports SOB with ambulation. Pt was recently d/c from [facility] on Christmas day after being admitted for pneumonia. Pt completed his antibiotics at home a week ago. Pt states that Dr Tressia Miners stated that his pneuonia was complicated. Pt reports pain to the right side of the chest. Admitting MD to the bedside.

## 2017-03-28 NOTE — ED Triage Notes (Signed)
Pt sent from [facility] with c/o positive xray for collapsed lung. Pt states he recently had treatment for pneumonia but never got to feeling any better so he went back to the [facility] today. Pt is in NAD on arrival, respiration unlabored upon arrival.

## 2017-03-28 NOTE — H&P (Signed)
[facility] [place] NAME: Kristopher [name]    MR#:  [id number]  DATE OF BIRTH:  March 09, 1961  DATE OF ADMISSION:  03/28/2017  PRIMARY CARE PHYSICIAN: Patient, No Pcp Per   REQUESTING/REFERRING PHYSICIAN: Schuyler Amor, MD  CHIEF COMPLAINT:   Chief Complaint  Patient presents with  . Shortness of Breath    HISTORY OF PRESENT ILLNESS: Kristopher [name]  is a 57 y.o. male with a known history of pneumonia requiring admission in December.  Patient has been admitted twice for same.  At that time he was treated for H CAP pneumonia.  He was discharged home on Augmentin continue to not feel well he started getting short of breath with exertion.  Patient has not had any cough fevers or chills however he just not does not feel well.  He states that he was smoking before he has stopped smoking he used to drink 12 beers a day but now he is cut down to 6 beers a day.  He denies any nausea vomiting or diarrhea.    PAST MEDICAL HISTORY:   Past Medical History:  Diagnosis Date  . Electric injury   . PNA (pneumonia)     PAST SURGICAL HISTORY:  Past Surgical History:  Procedure Laterality Date  . SKIN GRAFT      SOCIAL HISTORY:  Social History   Tobacco Use  . Smoking status: Former Research scientist (life sciences)  . Smokeless tobacco: Never Used  Substance Use Topics  . Alcohol use: Yes    FAMILY HISTORY: No family history on file.  DRUG ALLERGIES: No Known Allergies  REVIEW OF SYSTEMS:   CONSTITUTIONAL: No fever, fatigue or weakness.  EYES: No blurred or double vision.  EARS, NOSE, AND THROAT: No tinnitus or ear pain.  RESPIRATORY: No cough, positive shortness of breath, wheezing or hemoptysis.  CARDIOVASCULAR: No chest pain, orthopnea, edema.  GASTROINTESTINAL: No nausea, vomiting, diarrhea or abdominal pain.  GENITOURINARY: No dysuria, hematuria.  ENDOCRINE: No polyuria, nocturia,  HEMATOLOGY: No anemia, easy bruising or bleeding SKIN: No rash or  lesion. MUSCULOSKELETAL: No joint pain or arthritis.   NEUROLOGIC: No tingling, numbness, weakness.  PSYCHIATRY: No anxiety or depression.   MEDICATIONS AT HOME:  Prior to Admission medications   Medication Sig Start Date End Date Taking? Authorizing Provider  amoxicillin-clavulanate (AUGMENTIN) 875-125 MG tablet Take 1 tablet by mouth 2 (two) times daily. X 14 days Patient not taking: Reported on 03/28/2017 03/04/17   Gladstone Lighter, MD  benzonatate (TESSALON) 200 MG capsule Take 1 capsule (200 mg total) by mouth 3 (three) times daily. Patient not taking: Reported on 03/28/2017 03/04/17   Gladstone Lighter, MD  [facility] HFA [phone] Base) MCG/ACT inhaler Inhale 2 puffs into the lungs every 6 (six) hours as needed. 02/24/17   [provider]      PHYSICAL EXAMINATION:   VITAL SIGNS: Blood pressure 124/86, pulse (!) 105, temperature 99.4 F (37.4 C), temperature source Oral, resp. rate 18, height 5\' 11"  (1.803 m), weight 130 lb (59 kg), SpO2 98 %.  GENERAL:  57 y.o.-year-old patient lying in the bed with no acute distress.  EYES: Pupils equal, round, reactive to light and accommodation. No scleral icterus. Extraocular muscles intact.  HEENT: Head atraumatic, normocephalic. Oropharynx and nasopharynx clear.  NECK:  Supple, no jugular venous distention. No thyroid enlargement, no tenderness.  LUNGS: decreased BS Left lung, no accessory muscle usage.  CARDIOVASCULAR: S1, S2 normal. No murmurs, rubs, or gallops.  ABDOMEN: Soft,  nontender, nondistended. Bowel sounds present. No organomegaly or mass.  EXTREMITIES: No pedal edema, cyanosis, or clubbing.  NEUROLOGIC: Cranial nerves II through XII are intact. Muscle strength 5/5 in all extremities. Sensation intact. Gait not checked.  PSYCHIATRIC: The patient is alert and oriented x 3.  SKIN: No obvious rash, lesion, or ulcer.   LABORATORY PANEL:   CBC Recent Labs  Lab 03/28/17 1839  WBC 15.9*  HGB 12.2*  HCT 36.6*  PLT 644*   MCV 91.3  MCH 30.5  MCHC 33.4  RDW 13.9   ------------------------------------------------------------------------------------------------------------------  Chemistries  Recent Labs  Lab 03/28/17 1839  NA 127*  K 4.4  CL 88*  CO2 26  GLUCOSE 116*  BUN 6  CREATININE 0.45*  CALCIUM 8.7*  AST 36  ALT 25  ALKPHOS 107  BILITOT 0.9   ------------------------------------------------------------------------------------------------------------------ estimated creatinine clearance is 84 mL/min (A) (by C-G formula based on SCr of 0.45 mg/dL (L)). ------------------------------------------------------------------------------------------------------------------ No results for input(s): TSH, T4TOTAL, T3FREE, THYROIDAB in the last 72 hours.  Invalid input(s): FREET3   Coagulation profile No results for input(s): INR, PROTIME in the last 168 hours. ------------------------------------------------------------------------------------------------------------------- No results for input(s): DDIMER in the last 72 hours. -------------------------------------------------------------------------------------------------------------------  Cardiac Enzymes Recent Labs  Lab 03/28/17 1839  TROPONINI <0.03   ------------------------------------------------------------------------------------------------------------------ Invalid input(s): POCBNP  ---------------------------------------------------------------------------------------------------------------  Urinalysis    Component Value Date/Time   COLORURINE AMBER (A) 03/01/2017 1339   APPEARANCEUR CLEAR (A) 03/01/2017 1339   LABSPEC 1.025 03/01/2017 1339   PHURINE 6.0 03/01/2017 1339   GLUCOSEU NEGATIVE 03/01/2017 1339   HGBUR NEGATIVE 03/01/2017 1339   BILIRUBINUR NEGATIVE 03/01/2017 1339   KETONESUR NEGATIVE 03/01/2017 1339   PROTEINUR NEGATIVE 03/01/2017 1339   NITRITE NEGATIVE 03/01/2017 1339   LEUKOCYTESUR NEGATIVE  03/01/2017 1339     RADIOLOGY: Dg Chest 2 View  Result Date: 03/28/2017 CLINICAL DATA:  Patient reports SOB onset today. No known heart or lung conditions. Current smoker. EXAM: CHEST  2 VIEW COMPARISON:  Chest CT 03/02/2017, chest radiograph 03/04/2017 FINDINGS: Normal cardiac silhouette. There is continued increase in fluid within the RIGHT hemithorax. The RIGHT hemithorax is near completely opacified (95%). This is increased significantly from comparison chest radiograph. There is mild mass effect upon the mediastinum. IMPRESSION: 1. Increasing pleural fluid in the RIGHT hemithorax with near complete opacification of the RIGHT hemithorax. This is increased significantly from 03/04/2017 where patient was found to have pneumonia on chest CT. 2. Mild shift of the mediastinum by the increasing pleural fluid. 3. Consider thoracentesis with cytology to evaluate for pulmonary malignancy. Electronically Signed   By: Suzy Bouchard M.D.   On: 03/28/2017 19:11   Ct Chest Wo Contrast  Result Date: 03/28/2017 CLINICAL DATA:  57 year old male recently treated for pneumonia but failed to improve. EXAM: CT CHEST WITHOUT CONTRAST TECHNIQUE: Multidetector CT imaging of the chest was performed following the standard protocol without IV contrast. COMPARISON:  Chest CT 03/02/2017. FINDINGS: Cardiovascular: Heart size is normal. There is no significant pericardial fluid, thickening or pericardial calcification. There is aortic atherosclerosis, as well as atherosclerosis of the great vessels of the mediastinum and the coronary arteries, including calcified atherosclerotic plaque in the left anterior descending, left circumflex and right coronary arteries. Mediastinum/Nodes: No pathologically enlarged mediastinal or hilar lymph nodes. Please note that accurate exclusion of hilar adenopathy is limited on noncontrast CT scans. Esophagus is unremarkable in appearance. No axillary lymphadenopathy. Lungs/Pleura: Massive  right-sided pleural effusion with nondependent gas significantly increased in size compared to the prior study, compatible  with an empyema. This is associated with extensive passive atelectasis throughout the right lung, with evidence of persistent consolidation portions of the right lower lobe. Left lung is clear. No pleural effusions. Upper Abdomen: Aortic atherosclerosis. Musculoskeletal: There are no aggressive appearing lytic or blastic lesions noted in the visualized portions of the skeleton. IMPRESSION: 1. Massive right-sided empyema with near complete passive atelectasis of the right lung. Residual airspace consolidation in the right lower lobe, compatible with persistent pneumonia. 2. Aortic atherosclerosis, in addition to three-vessel coronary artery disease. Please note that although the presence of coronary artery calcium documents the presence of coronary artery disease, the severity of this disease and any potential stenosis cannot be assessed on this non-gated CT examination. Assessment for potential risk factor modification, dietary therapy or pharmacologic therapy may be warranted, if clinically indicated. Aortic Atherosclerosis (ICD10-I70.0). Electronically Signed   By: Vinnie Langton M.D.   On: 03/28/2017 19:42    EKG: Orders placed or performed during the hospital encounter of 03/28/17  . ED EKG  . ED EKG    IMPRESSION AND PLAN: Pt is 57 y.o with h/o recent admission for pna with sob   1. Recurrent pneumonia now with empyema I will treat patient with IV vancomycin and Zosyn I will have cardiothoracic surgery and pulmonary see the patient Also ask infectious disease to see the patient HIV was negative during previous admission  2. 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  History of alcohol abuse drinks 6 beers per day amount of place him on CIWA protocol  4. Misc: lovenox for dvt proph  All the  records are reviewed and case discussed with ED provider. Management plans discussed with the patient, family and they are in agreement.  CODE STATUS:    Code Status Orders  (From admission, onward)        Start     Ordered   03/28/17 2112  Full code  Continuous     03/28/17 2113    Code Status History    Date Active Date Inactive Code Status Order ID Comments User Context   03/01/2017 17:58 03/04/2017 21:32 Full Code [id number]  Nicholes Mango, MD Inpatient       TOTAL TIME TAKING CARE OF THIS PATIENT: 46minutes.    Dustin Flock M.D on 03/28/2017 at 9:15 PM  Between 7am to 6pm - Pager - [phone]  After 6pm go to [email or web address] - password [email or web address] [facility]  Office  [phone]  CC: Primary care physician; Patient, No Pcp Per

## 2017-03-28 NOTE — ED Provider Notes (Signed)
[facility] Emergency Department Provider Note  ____________________________________________   I have reviewed the triage vital signs and the nursing notes. Where available I have reviewed prior notes and, if possible and indicated, outside hospital notes.    HISTORY  Chief Complaint Shortness of Breath    HPI Kristopher [name] is a 57 y.o. male who presents today complaining of feeling tuckered out and weak.  He states he has been short of breath cannot lie flat.  Patient was seen and admitted in December for a pneumonia, felt better went home, went to outpatient care today, had what is considered to be an empyema on repeat CT scan done in the emergency department here.  He has had no fever, he is still coughing some, but he feels weak and short of breath.  It is worse when he walks around, better when he lies still no other prior treatment, significant shortness of breath he states is present, has been there since he went home seems to be getting worse,    Past Medical History:  Diagnosis Date  . Electric injury     Patient Active Problem List   Diagnosis Date Noted  . Protein-calorie malnutrition, severe 03/03/2017  . HCAP (healthcare-associated pneumonia) 03/01/2017    Past Surgical History:  Procedure Laterality Date  . SKIN GRAFT      Prior to Admission medications   Medication Sig Start Date End Date Taking? Authorizing Provider  amoxicillin-clavulanate (AUGMENTIN) 875-125 MG tablet Take 1 tablet by mouth 2 (two) times daily. X 14 days 03/04/17   Gladstone Lighter, MD  benzonatate (TESSALON) 200 MG capsule Take 1 capsule (200 mg total) by mouth 3 (three) times daily. 03/04/17   Gladstone Lighter, MD  PROAIR HFA 108 (90 Base) MCG/ACT inhaler Inhale 2 puffs into the lungs every 6 (six) hours as needed. 02/24/17   [provider]    Allergies Patient has no known allergies.  No family history on file.  Social History Social History    Tobacco Use  . Smoking status: Former Research scientist (life sciences)  . Smokeless tobacco: Never Used  Substance Use Topics  . Alcohol use: Yes  . Drug use: No    Review of Systems Constitutional: No fever/chills Eyes: No visual changes. ENT: No sore throat. No stiff neck no neck pain Cardiovascular: Denies chest pain. Respiratory: + shortness of breath. Gastrointestinal:   no vomiting.  No diarrhea.  No constipation. Genitourinary: Negative for dysuria. Musculoskeletal: Negative lower extremity swelling Skin: Negative for rash. Neurological: Negative for severe headaches, focal weakness or numbness.   ____________________________________________   PHYSICAL EXAM:  VITAL SIGNS: ED Triage Vitals  Enc Vitals Group     BP 03/28/17 1825 124/86     Pulse Rate 03/28/17 1825 (!) 105     Resp 03/28/17 1825 18     Temp 03/28/17 1825 99.4 F (37.4 C)     Temp Source 03/28/17 1825 Oral     SpO2 03/28/17 1825 98 %     Weight 03/28/17 1825 130 lb (59 kg)     Height 03/28/17 1825 5\' 11"  (1.803 m)     Head Circumference --      Peak Flow --      Pain Score 03/28/17 1824 2     Pain Loc --      Pain Edu? --      Excl. in [place]? --     Constitutional: Alert and oriented. Well appearing and in no acute distress. Eyes: Conjunctivae are normal  Head: Atraumatic HEENT: No congestion/rhinnorhea. Mucous membranes are moist.  Oropharynx non-erythematous Neck:   Nontender with no meningismus, no masses, no stridor Cardiovascular: Normal rate, regular rhythm. Grossly normal heart sounds.  Good peripheral circulation. Respiratory: Normal respiratory effort.  No retractions.  Very diminished breath sounds on the right Abdominal: Soft and nontender. No distention. No guarding no rebound Back:  There is no focal tenderness or step off.  there is no midline tenderness there are no lesions noted. there is no CVA tenderness Musculoskeletal: No lower extremity tenderness, no upper extremity tenderness. No joint  effusions, no DVT signs strong distal pulses no edema Neurologic:  Normal speech and language. No gross focal neurologic deficits are appreciated.  Skin:  Skin is warm, dry and intact. No rash noted. Psychiatric: Mood and affect are normal. Speech and behavior are normal.  ____________________________________________   LABS (all labs ordered are listed, but only abnormal results are displayed)  Labs Reviewed  CBC - Abnormal; Notable for the following components:      Result Value   WBC 15.9 (*)    RBC 4.01 (*)    Hemoglobin 12.2 (*)    HCT 36.6 (*)    Platelets 644 (*)    All other components within normal limits  COMPREHENSIVE METABOLIC PANEL - Abnormal; Notable for the following components:   Sodium 127 (*)    Chloride 88 (*)    Glucose, Bld 116 (*)    Creatinine, Ser 0.45 (*)    Calcium 8.7 (*)    Albumin 2.7 (*)    All other components within normal limits  CULTURE, BLOOD (ROUTINE X 2)  CULTURE, BLOOD (ROUTINE X 2)  LACTIC ACID, PLASMA  LACTIC ACID, PLASMA  TROPONIN I    Pertinent labs  results that were available during my care of the patient were reviewed by me and considered in my medical decision making (see chart for details). ____________________________________________  EKG  I personally interpreted any EKGs ordered by me or triage Sinus tach rate 106, no acute ST elevation or depression, no ischemic changes ____________________________________________  RADIOLOGY  Pertinent labs & imaging results that were available during my care of the patient were reviewed by me and considered in my medical decision making (see chart for details). If possible, patient and/or family made aware of any abnormal findings.  Dg Chest 2 View  Result Date: 03/28/2017 CLINICAL DATA:  Patient reports SOB onset today. No known heart or lung conditions. Current smoker. EXAM: CHEST  2 VIEW COMPARISON:  Chest CT 03/02/2017, chest radiograph 03/04/2017 FINDINGS: Normal cardiac  silhouette. There is continued increase in fluid within the RIGHT hemithorax. The RIGHT hemithorax is near completely opacified (95%). This is increased significantly from comparison chest radiograph. There is mild mass effect upon the mediastinum. IMPRESSION: 1. Increasing pleural fluid in the RIGHT hemithorax with near complete opacification of the RIGHT hemithorax. This is increased significantly from 03/04/2017 where patient was found to have pneumonia on chest CT. 2. Mild shift of the mediastinum by the increasing pleural fluid. 3. Consider thoracentesis with cytology to evaluate for pulmonary malignancy. Electronically Signed   By: Suzy Bouchard M.D.   On: 03/28/2017 19:11   Ct Chest Wo Contrast  Result Date: 03/28/2017 CLINICAL DATA:  57 year old male recently treated for pneumonia but failed to improve. EXAM: CT CHEST WITHOUT CONTRAST TECHNIQUE: Multidetector CT imaging of the chest was performed following the standard protocol without IV contrast. COMPARISON:  Chest CT 03/02/2017. FINDINGS: Cardiovascular: Heart size is normal. There  is no significant pericardial fluid, thickening or pericardial calcification. There is aortic atherosclerosis, as well as atherosclerosis of the great vessels of the mediastinum and the coronary arteries, including calcified atherosclerotic plaque in the left anterior descending, left circumflex and right coronary arteries. Mediastinum/Nodes: No pathologically enlarged mediastinal or hilar lymph nodes. Please note that accurate exclusion of hilar adenopathy is limited on noncontrast CT scans. Esophagus is unremarkable in appearance. No axillary lymphadenopathy. Lungs/Pleura: Massive right-sided pleural effusion with nondependent gas significantly increased in size compared to the prior study, compatible with an empyema. This is associated with extensive passive atelectasis throughout the right lung, with evidence of persistent consolidation portions of the right lower  lobe. Left lung is clear. No pleural effusions. Upper Abdomen: Aortic atherosclerosis. Musculoskeletal: There are no aggressive appearing lytic or blastic lesions noted in the visualized portions of the skeleton. IMPRESSION: 1. Massive right-sided empyema with near complete passive atelectasis of the right lung. Residual airspace consolidation in the right lower lobe, compatible with persistent pneumonia. 2. Aortic atherosclerosis, in addition to three-vessel coronary artery disease. Please note that although the presence of coronary artery calcium documents the presence of coronary artery disease, the severity of this disease and any potential stenosis cannot be assessed on this non-gated CT examination. Assessment for potential risk factor modification, dietary therapy or pharmacologic therapy may be warranted, if clinically indicated. Aortic Atherosclerosis (ICD10-I70.0). Electronically Signed   By: Vinnie Langton M.D.   On: 03/28/2017 19:42   ____________________________________________    PROCEDURES  Procedure(s) performed: None  Procedures  Critical Care performed: CRITICAL CARE Performed by: Schuyler Amor   Total critical care time: 35 minutes  Critical care time was exclusive of separately billable procedures and treating other patients.  Critical care was necessary to treat or prevent imminent or life-threatening deterioration.  Critical care was time spent personally by me on the following activities: development of treatment plan with patient and/or surrogate as well as nursing, discussions with consultants, evaluation of patient's response to treatment, examination of patient, obtaining history from patient or surrogate, ordering and performing treatments and interventions, ordering and review of laboratory studies, ordering and review of radiographic studies, pulse oximetry and re-evaluation of patient's condition.   ____________________________________________   INITIAL  IMPRESSION / ASSESSMENT AND PLAN / ED COURSE  Pertinent labs & imaging results that were available during my care of the patient were reviewed by me and considered in my medical decision making (see chart for details).  With a massive empyema, however despite that is tolerating it well.  Gradually getting worse likely since Christmas.  White count elevated, we will treated for hospital-acquired pneumonia, we will get cultures and lactate, he does not appear to be septic at this time but he has a life-threatening disease process would likely require inpatient drainage.  We will admit to the hospitalist.  Hospitalist agrees with management    ____________________________________________   FINAL CLINICAL IMPRESSION(S) / ED DIAGNOSES  Final diagnoses:  None      This chart was dictated using voice recognition software.  Despite best efforts to proofread,  errors can occur which can change meaning.      Schuyler Amor, MD 03/28/17 2029

## 2017-03-29 ENCOUNTER — Inpatient Hospital Stay: Payer: 59

## 2017-03-29 DIAGNOSIS — F1010 Alcohol abuse, uncomplicated: Secondary | ICD-10-CM

## 2017-03-29 DIAGNOSIS — E43 Unspecified severe protein-calorie malnutrition: Secondary | ICD-10-CM

## 2017-03-29 DIAGNOSIS — R64 Cachexia: Secondary | ICD-10-CM

## 2017-03-29 DIAGNOSIS — J869 Pyothorax without fistula: Principal | ICD-10-CM

## 2017-03-29 LAB — CBC
HCT: 31.3 % — ABNORMAL LOW (ref 40.0–52.0)
Hemoglobin: 10.9 g/dL — ABNORMAL LOW (ref 13.0–18.0)
MCH: 31.6 pg (ref 26.0–34.0)
MCHC: 34.9 g/dL (ref 32.0–36.0)
MCV: 90.5 fL (ref 80.0–100.0)
PLATELETS: 588 10*3/uL — AB (ref 150–440)
RBC: 3.46 MIL/uL — ABNORMAL LOW (ref 4.40–5.90)
RDW: 14 % (ref 11.5–14.5)
WBC: 11.1 10*3/uL — AB (ref 3.8–10.6)

## 2017-03-29 LAB — BASIC METABOLIC PANEL
ANION GAP: 10 (ref 5–15)
BUN: 6 mg/dL (ref 6–20)
CALCIUM: 8.6 mg/dL — AB (ref 8.9–10.3)
CO2: 27 mmol/L (ref 22–32)
Chloride: 92 mmol/L — ABNORMAL LOW (ref 101–111)
Creatinine, Ser: 0.48 mg/dL — ABNORMAL LOW (ref 0.61–1.24)
GFR calc Af Amer: >60 mL/min (ref >60)
Glucose, Bld: 96 mg/dL (ref 65–99)
Potassium: 4.1 mmol/L (ref 3.5–5.1)
Sodium: 129 mmol/L — ABNORMAL LOW (ref 135–145)

## 2017-03-29 LAB — BODY FLUID CELL COUNT WITH DIFFERENTIAL
EOS FL: 0 %
LYMPHS FL: 4 %
MONOCYTE-MACROPHAGE-SEROUS FLUID: 1 %
NEUTROPHIL FLUID: 95 %
OTHER CELLS FL: 0 %
Total Nucleated Cell Count, Fluid: 46065 cu mm

## 2017-03-29 LAB — LACTATE DEHYDROGENASE: LDH: >10000 U/L — ABNORMAL HIGH (ref 98–192)

## 2017-03-29 LAB — GLUCOSE, CAPILLARY: Glucose-Capillary: 143 mg/dL — ABNORMAL HIGH (ref 65–99)

## 2017-03-29 MED ORDER — FENTANYL CITRATE (PF) 100 MCG/2ML IJ SOLN
50.0000 ug | Freq: Once | INTRAMUSCULAR | Status: AC
  Administered 2017-03-29: 50 ug via INTRAVENOUS

## 2017-03-29 MED ORDER — OXYCODONE-ACETAMINOPHEN 7.5-325 MG PO TABS
1.0000 | ORAL_TABLET | ORAL | Status: DC | PRN
  Administered 2017-03-29 (×2): 1 via ORAL
  Filled 2017-03-29 (×2): qty 1

## 2017-03-29 MED ORDER — FENTANYL CITRATE (PF) 100 MCG/2ML IJ SOLN: 100.0000 ug | Freq: Once | INTRAMUSCULAR | Status: DC

## 2017-03-29 MED ORDER — OXYCODONE-ACETAMINOPHEN 7.5-325 MG PO TABS: 1.0000 | ORAL_TABLET | ORAL | Status: DC | PRN

## 2017-03-29 MED ORDER — MIDAZOLAM HCL 2 MG/2ML IJ SOLN
INTRAMUSCULAR | Status: AC
  Administered 2017-03-29: 3 mg via INTRAVENOUS
  Filled 2017-03-29: qty 4

## 2017-03-29 MED ORDER — MIDAZOLAM HCL 2 MG/2ML IJ SOLN: 4.0000 mg | Freq: Once | INTRAMUSCULAR | Status: DC

## 2017-03-29 MED ORDER — VANCOMYCIN HCL IN DEXTROSE 1-5 GM/200ML-% IV SOLN
1000.0000 mg | Freq: Two times a day (BID) | INTRAVENOUS | Status: DC
  Administered 2017-03-29: 1000 mg via INTRAVENOUS
  Filled 2017-03-29 (×2): qty 200

## 2017-03-29 MED ORDER — FENTANYL CITRATE (PF) 100 MCG/2ML IJ SOLN
INTRAMUSCULAR | Status: AC
  Administered 2017-03-29: 50 ug via INTRAVENOUS
  Filled 2017-03-29: qty 2

## 2017-03-29 MED ORDER — LIDOCAINE HCL 1 % IJ SOLN
15.0000 mL | Freq: Once | INTRAMUSCULAR | Status: AC
  Administered 2017-03-29: 15 mL

## 2017-03-29 MED ORDER — IBUPROFEN 400 MG PO TABS
400.0000 mg | ORAL_TABLET | Freq: Four times a day (QID) | ORAL | Status: DC | PRN
  Administered 2017-03-29 – 2017-03-31 (×5): 400 mg via ORAL
  Filled 2017-03-29 (×6): qty 1

## 2017-03-29 MED ORDER — MIDAZOLAM HCL 2 MG/2ML IJ SOLN
3.0000 mg | Freq: Once | INTRAMUSCULAR | Status: AC
  Administered 2017-03-29: 3 mg via INTRAVENOUS

## 2017-03-29 MED ORDER — MORPHINE SULFATE (PF) 2 MG/ML IV SOLN
2.0000 mg | INTRAVENOUS | Status: DC | PRN
  Administered 2017-03-29: 2 mg via INTRAVENOUS
  Filled 2017-03-29: qty 1

## 2017-03-29 NOTE — Progress Notes (Signed)
Pharmacy Antibiotic Note  Kristopher [name] is a 57 y.o. male admitted on 03/28/2017 with pneumonia.  Pharmacy has been consulted for vanc/zosyn dosing.  Plan: Patient received vanc 1g, zosyn 3.375g, and cefepime 1g IV x 1 in ED  Will f/u w/ vanc 1g IV q12h w/ 6 hour stack dose Will draw vanc trough 01/20 @ 1600 prior to 4th dose. Will continue zosyn 3.375g IV q8h  Ke 0.0741 T1/2 9.6 ~ 12 hrs Goal trough 15 - 20 mcg/mL  Height: 5\' 11"  (180.3 cm) Weight: 130 lb (59 kg) IBW/kg (Calculated) : 75.3  Temp (24hrs), Avg:98.7 F (37.1 C), Min:98 F (36.7 C), Max:99.4 F (37.4 C)  Recent Labs  Lab 03/28/17 1839 03/28/17 2044  WBC 15.9*  --   CREATININE 0.45*  --   LATICACIDVEN  --  1.3    Estimated Creatinine Clearance: 84 mL/min (A) (by C-G formula based on SCr of 0.45 mg/dL (L)).    No Known Allergies  Thank you for allowing pharmacy to be a part of this patient's care.  Tobie Lords, PharmD, BCPS Clinical Pharmacist 03/29/2017

## 2017-03-29 NOTE — Consult Note (Signed)
PULMONARY / CRITICAL CARE MEDICINE   Name: Kristopher [name] MRN: [id number] DOB: 10/08/1960    ADMISSION DATE:  03/28/2017 CONSULTATION DATE:  01/19  PT PROFILE:   64 M recently treated at [facility] for RLL necrotizing pneumonia effusion.  He was discharged home on 03/04/17.  He presented again to the [facility] ED 03/28/17 with profound weakness and exertional dyspnea.  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  He was transferred to ICU/SDU on 03/29/17 for chest tube placement.  MAJOR EVENTS/TEST RESULTS: 12/22-12/25/18 hospitalized for RLL PNA 12/23 CT chest: Extensive RLL necrotizing pneumonia with small right parapneumonic effusion. 01/18 readmitted with malaise, weakness, exertional dyspnea.  CXR and CT chest revealed massive right pleural effusion 01/19 right chest tube placement.  3500 cc pus drained immediately.  INDWELLING DEVICES:: Chest tube 01/19 >>   MICRO DATA: MRSA PCR 01/19 >>  Pleural fluid 01/19 >>  Blood 01/18 >>   ANTIMICROBIALS:  Vanc 01/18 >> 01/19 Pip-tazo 01/18 >>    HISTORY OF PRESENT ILLNESS:   As above.  Upon my initial evaluation, he was remarkably comfortable appearing and had saturations in the mid 90s on room air.  He was denying pleuritic chest pain.  His chief complaint was just general malaise.  PAST MEDICAL HISTORY :  He  has a past medical history of Electric injury and PNA (pneumonia).  PAST SURGICAL HISTORY: He  has a past surgical history that includes Skin graft.  No Known Allergies  No current facility-administered medications on file prior to encounter.    Current Outpatient Medications on File Prior to Encounter  Medication Sig  . amoxicillin-clavulanate (AUGMENTIN) 875-125 MG tablet Take 1 tablet by mouth 2 (two) times daily. X 14 days (Patient not taking: Reported on 03/28/2017)  . benzonatate (TESSALON) 200 MG capsule Take 1 capsule (200 mg total) by mouth 3 (three) times daily.  (Patient not taking: Reported on 03/28/2017)  . PROAIR HFA 108 (90 Base) MCG/ACT inhaler Inhale 2 puffs into the lungs every 6 (six) hours as needed.    FAMILY HISTORY:  His has no family status information on file.    SOCIAL HISTORY: He  reports that he has quit smoking. he has never used smokeless tobacco. He reports that he drinks alcohol. He reports that he does not use drugs.  REVIEW OF SYSTEMS:   As previously documented  SUBJECTIVE:    VITAL SIGNS: BP 123/85 (BP Location: Left Arm)   Pulse 89   Temp 98.7 F (37.1 C) (Oral)   Resp 20   Ht 5\' 11"  (1.803 m)   Wt 58.4 kg (128 lb 12 oz)   SpO2 97%   BMI 17.96 kg/m   HEMODYNAMICS:    VENTILATOR SETTINGS:    INTAKE / OUTPUT: I/O last 3 completed shifts: In: -  Out: 1 [Urine:1]  PHYSICAL EXAMINATION: General: Cachectic, unkempt, NAD Neuro: CNs intact, motor/sensory intact HEENT: NCAT, sclerae white, severe temporal wasting Cardiovascular: Regular, no M Lungs: Absent breath sounds on the right, no wheezes Abdomen: Scaphoid, nontender, bowel sounds present Ext: No clubbing cyanosis or edema  LABS:  BMET Recent Labs  Lab 03/28/17 1839 03/29/17 0327  NA 127* 129*  K 4.4 4.1  CL 88* 92*  CO2 26 27  BUN 6 6  CREATININE 0.45* 0.48*  GLUCOSE 116* 96    Electrolytes Recent Labs  Lab 03/28/17 1839 03/29/17 0327  CALCIUM 8.7* 8.6*    CBC Recent Labs  Lab 03/28/17 1839 03/29/17 0327  WBC 15.9* 11.1*  HGB 12.2* 10.9*  HCT 36.6* 31.3*  PLT 644* 588*    Coag's No results for input(s): APTT, INR in the last 168 hours.  Sepsis Markers Recent Labs  Lab 03/28/17 2044  LATICACIDVEN 1.3    ABG No results for input(s): PHART, PCO2ART, PO2ART in the last 168 hours.  Liver Enzymes Recent Labs  Lab 03/28/17 1839  AST 36  ALT 25  ALKPHOS 107  BILITOT 0.9  ALBUMIN 2.7*    Cardiac Enzymes Recent Labs  Lab 03/28/17 1839  TROPONINI <0.03    Glucose Recent Labs  Lab 03/29/17 0932   GLUCAP 143*    CXR: After chest tube placement, there has been significant drainage of the right empyema.  He has a small pneumothorax and mild to moderate amount of residual fluid.    ASSESSMENT / PLAN: Recent RLL necrotizing pneumonia Readmitted with tension empyema, now S/P chest tube placement Severe protein-calorie malnutrition with cachexia Chronic alcohol abuse  Monitor and SDU through today I do not think he needs the vancomycin, have discontinued this Continue Pip-tazo and narrow as indicated by micro results Advance nutrition as able CIWA protocol has been ordered  Merton Border, MD PCCM service Mobile [phone] Pager [phone] 03/29/2017 12:08 PM  03/29/2017, 11:59 AM

## 2017-03-29 NOTE — Procedures (Signed)
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  He expressed an understanding of the procedure and agreed to proceed.  PRE-MEDICATION: Midazolam 3 mg, fentanyl 50 mcg.  PROCEDURE: The patient was transferred to the ICU/SDU for this procedure to be performed under conscious sedation.  Nasal cannula oxygen was applied.  Cardiac monitoring was performed throughout the procedure.  After premedications were administered, chlorhexidine swabs were used to create a sterile skin surface.  He was draped in a sterile manner.  1% lidocaine was used for anesthesia.  Initially, the skin was anesthetized deeper anesthesia to the rib and pleura was achieved with 1% lidocaine.  A chest tube tray was used.  The thoracic space was entered with an introducer needle.  Pus was aspirated.  A wire was advanced through the introducer needle and the needle was removed.  Sequential dilators were passed over the wire.  24 French chest tube was placed over the wire and sutured at approximately 22 cm.  Approximately 1200 cc pus drained immediately.  The chest tube was clamped for approximately 5 minutes.  Then another liter was drained.  Again, the chest tube was clamped for approximately 5 minutes.  Subsequently, the chest tube was opened to the drainage chamber.  All told, approximately 3500 cc of pus were drained within the first 10 or 15 minutes after chest tube placement.  Pleur-evac drainage chamber was hooked to suction.  A specimen of fluid was obtained and sent for chemistries, cytology, Gram stain and culture.  Patient tolerated the procedure well without complications.  He will be monitored in the stepdown unit through the course of the day and daily chest x-rays have been ordered.  Merton Border, MD PCCM service Mobile [phone] Pager  [phone] 03/29/2017 11:55 AM

## 2017-03-29 NOTE — Progress Notes (Signed)
Pt alert and oriented. CIWA negative so far. Medicated for pain with good results. Pt remaining on room air. Pt able to sleep in between care.

## 2017-03-29 NOTE — Progress Notes (Signed)
[facility] NAME: Kristopher [name]    MR#:  [id number]  DATE OF BIRTH:  1960-11-27  SUBJECTIVE:   Patient presents for shortness of breath. CT shows large empyema.  REVIEW OF SYSTEMS:    Review of Systems  Constitutional: Negative for fever, chills weight loss Positive for generalized weakness and fatigue HENT: Negative for ear pain, nosebleeds, congestion, facial swelling, rhinorrhea, neck pain, neck stiffness and ear discharge.   Respiratory: Positive cough and shortness of breath no wheezing Cardiovascular: Negative for chest pain, palpitations and leg swelling.  Gastrointestinal: Negative for heartburn, abdominal pain, vomiting, diarrhea or consitpation Genitourinary: Negative for dysuria, urgency, frequency, hematuria Musculoskeletal: Negative for back pain or joint pain Neurological: Negative for dizziness, seizures, syncope, focal weakness,  numbness and headaches.  Hematological: Does not bruise/bleed easily.  Psychiatric/Behavioral: Negative for hallucinations, confusion, dysphoric mood    Tolerating Diet: yes      DRUG ALLERGIES:  No Known Allergies  VITALS:  Blood pressure 114/89, pulse 89, temperature 97.8 F (36.6 C), temperature source Oral, resp. rate 20, height 5\' 11"  (1.803 m), weight 59 kg (130 lb), SpO2 97 %.  PHYSICAL EXAMINATION:  Constitutional: Appears thin. No distress. HENT: Normocephalic. Marland Kitchen Oropharynx is clear and moist.  Eyes: Conjunctivae and EOM are normal. PERRLA, no scleral icterus.  Neck: Normal ROM. Neck supple. No JVD. No tracheal deviation. CVS: RRR, S1/S2 +, no murmurs, no gallops, no carotid bruit.  Pulmonary: Increased respiratory effort with decreased breath sounds two thirds right side  Abdominal: Soft. BS +,  no distension, tenderness, rebound or guarding.  Musculoskeletal: Normal range of motion. No edema and no tenderness.  Neuro: Alert. CN 2-12 grossly intact. No focal  deficits. Skin: Skin is warm and dry. No rash noted. Psychiatric: Normal mood and affect.      LABORATORY PANEL:   CBC Recent Labs  Lab 03/29/17 0327  WBC 11.1*  HGB 10.9*  HCT 31.3*  PLT 588*   ------------------------------------------------------------------------------------------------------------------  Chemistries  Recent Labs  Lab 03/28/17 1839 03/29/17 0327  NA 127* 129*  K 4.4 4.1  CL 88* 92*  CO2 26 27  GLUCOSE 116* 96  BUN 6 6  CREATININE 0.45* 0.48*  CALCIUM 8.7* 8.6*  AST 36  --   ALT 25  --   ALKPHOS 107  --   BILITOT 0.9  --    ------------------------------------------------------------------------------------------------------------------  Cardiac Enzymes Recent Labs  Lab 03/28/17 1839  TROPONINI <0.03   ------------------------------------------------------------------------------------------------------------------  RADIOLOGY:  Dg Chest 2 View  Result Date: 03/28/2017 CLINICAL DATA:  Patient reports SOB onset today. No known heart or lung conditions. Current smoker. EXAM: CHEST  2 VIEW COMPARISON:  Chest CT 03/02/2017, chest radiograph 03/04/2017 FINDINGS: Normal cardiac silhouette. There is continued increase in fluid within the RIGHT hemithorax. The RIGHT hemithorax is near completely opacified (95%). This is increased significantly from comparison chest radiograph. There is mild mass effect upon the mediastinum. IMPRESSION: 1. Increasing pleural fluid in the RIGHT hemithorax with near complete opacification of the RIGHT hemithorax. This is increased significantly from 03/04/2017 where patient was found to have pneumonia on chest CT. 2. Mild shift of the mediastinum by the increasing pleural fluid. 3. Consider thoracentesis with cytology to evaluate for pulmonary malignancy. Electronically Signed   By: Suzy Bouchard M.D.   On: 03/28/2017 19:11   Ct Chest Wo Contrast  Result Date: 03/28/2017 CLINICAL DATA:  57 year old male recently  treated for pneumonia but failed to improve. EXAM:  CT CHEST WITHOUT CONTRAST TECHNIQUE: Multidetector CT imaging of the chest was performed following the standard protocol without IV contrast. COMPARISON:  Chest CT 03/02/2017. FINDINGS: Cardiovascular: Heart size is normal. There is no significant pericardial fluid, thickening or pericardial calcification. There is aortic atherosclerosis, as well as atherosclerosis of the great vessels of the mediastinum and the coronary arteries, including calcified atherosclerotic plaque in the left anterior descending, left circumflex and right coronary arteries. Mediastinum/Nodes: No pathologically enlarged mediastinal or hilar lymph nodes. Please note that accurate exclusion of hilar adenopathy is limited on noncontrast CT scans. Esophagus is unremarkable in appearance. No axillary lymphadenopathy. Lungs/Pleura: Massive right-sided pleural effusion with nondependent gas significantly increased in size compared to the prior study, compatible with an empyema. This is associated with extensive passive atelectasis throughout the right lung, with evidence of persistent consolidation portions of the right lower lobe. Left lung is clear. No pleural effusions. Upper Abdomen: Aortic atherosclerosis. Musculoskeletal: There are no aggressive appearing lytic or blastic lesions noted in the visualized portions of the skeleton. IMPRESSION: 1. Massive right-sided empyema with near complete passive atelectasis of the right lung. Residual airspace consolidation in the right lower lobe, compatible with persistent pneumonia. 2. Aortic atherosclerosis, in addition to three-vessel coronary artery disease. Please note that although the presence of coronary artery calcium documents the presence of coronary artery disease, the severity of this disease and any potential stenosis cannot be assessed on this non-gated CT examination. Assessment for potential risk factor modification, dietary therapy or  pharmacologic therapy may be warranted, if clinically indicated. Aortic Atherosclerosis (ICD10-I70.0). Electronically Signed   By: Vinnie Langton M.D.   On: 03/28/2017 19:42     ASSESSMENT AND PLAN:   57 year old male with EtOH abuse who presents with shortness of breath and found to have large empyema and CT scan  1. Empyema: Patient will be transferred to stepdown unit and chest tube will be placed. If this is not effective patient may need decortication. Continue vancomycin and Zosyn  2. Hyponatremia: This is due to empyema and dehydration Sodium level slowly improving  3. EtOH abuse: Continue CIWA protocol   D/w dr simonds Management plans discussed with the patient and he is in agreement.  CODE STATUS: FULL  TOTAL TIME TAKING CARE OF THIS PATIENT: 30 minutes.     POSSIBLE D/C 3-5 days, DEPENDING ON CLINICAL CONDITION.   Meriem Lemieux M.D on 03/29/2017 at 9:11 AM  Between 7am to 6pm - Pager - [phone] After 6pm go to [email or web address] - password [email or web address] [facility]  Office  [phone]  CC: Primary care physician; Patient, No Pcp Per  Note: This dictation was prepared with Dragon dictation along with smaller phrase technology. Any transcriptional errors that result from this process are unintentional.

## 2017-03-29 NOTE — Progress Notes (Signed)
Pt reported to this nurse that in the past Percocet makes him itchy and he had forgotten about it. Pt denies any itching at present, but states that taking them for a longer period of time makes him itchy. No visible rash, no changes in breathing, no swelling. Pt in NAD. Will update allergy list, report to provider, and continue to monitor.

## 2017-03-30 ENCOUNTER — Inpatient Hospital Stay: Payer: 59

## 2017-03-30 LAB — BLOOD CULTURE ID PANEL (REFLEXED)
Acinetobacter baumannii: NOT DETECTED
CANDIDA GLABRATA: NOT DETECTED
CANDIDA TROPICALIS: NOT DETECTED
Candida albicans: NOT DETECTED
Candida krusei: NOT DETECTED
Candida parapsilosis: NOT DETECTED
ENTEROBACTER CLOACAE COMPLEX: NOT DETECTED
ENTEROCOCCUS SPECIES: NOT DETECTED
Enterobacteriaceae species: NOT DETECTED
Escherichia coli: NOT DETECTED
Haemophilus influenzae: NOT DETECTED
KLEBSIELLA PNEUMONIAE: NOT DETECTED
Klebsiella oxytoca: NOT DETECTED
LISTERIA MONOCYTOGENES: NOT DETECTED
Methicillin resistance: NOT DETECTED
NEISSERIA MENINGITIDIS: NOT DETECTED
PROTEUS SPECIES: NOT DETECTED
Pseudomonas aeruginosa: NOT DETECTED
STAPHYLOCOCCUS AUREUS BCID: NOT DETECTED
STAPHYLOCOCCUS SPECIES: DETECTED — AB
STREPTOCOCCUS SPECIES: NOT DETECTED
Serratia marcescens: NOT DETECTED
Streptococcus agalactiae: NOT DETECTED
Streptococcus pneumoniae: NOT DETECTED
Streptococcus pyogenes: NOT DETECTED

## 2017-03-30 LAB — MAGNESIUM: Magnesium: 1.8 mg/dL (ref 1.7–2.4)

## 2017-03-30 LAB — PREALBUMIN: PREALBUMIN: 8.3 mg/dL — AB (ref 18–38)

## 2017-03-30 LAB — CBC
HCT: 35.8 % — ABNORMAL LOW (ref 40.0–52.0)
Hemoglobin: 12.2 g/dL — ABNORMAL LOW (ref 13.0–18.0)
MCH: 31.2 pg (ref 26.0–34.0)
MCHC: 34 g/dL (ref 32.0–36.0)
MCV: 91.9 fL (ref 80.0–100.0)
Platelets: 553 10*3/uL — ABNORMAL HIGH (ref 150–440)
RBC: 3.9 MIL/uL — AB (ref 4.40–5.90)
RDW: 14.1 % (ref 11.5–14.5)
WBC: 10.7 10*3/uL — AB (ref 3.8–10.6)

## 2017-03-30 LAB — BASIC METABOLIC PANEL
Anion gap: 7 (ref 5–15)
BUN: 6 mg/dL (ref 6–20)
CO2: 29 mmol/L (ref 22–32)
CREATININE: 0.48 mg/dL — AB (ref 0.61–1.24)
Calcium: 8.4 mg/dL — ABNORMAL LOW (ref 8.9–10.3)
Chloride: 91 mmol/L — ABNORMAL LOW (ref 101–111)
GFR calc non Af Amer: >60 mL/min (ref >60)
Glucose, Bld: 95 mg/dL (ref 65–99)
Potassium: 4.1 mmol/L (ref 3.5–5.1)
SODIUM: 127 mmol/L — AB (ref 135–145)

## 2017-03-30 LAB — PHOSPHORUS: PHOSPHORUS: 4.1 mg/dL (ref 2.5–4.6)

## 2017-03-30 MED ORDER — ENSURE ENLIVE PO LIQD
237.0000 mL | Freq: Two times a day (BID) | ORAL | Status: DC
  Administered 2017-03-30: 237 mL via ORAL

## 2017-03-30 MED ORDER — OXYCODONE-ACETAMINOPHEN 7.5-325 MG PO TABS
1.0000 | ORAL_TABLET | ORAL | Status: DC | PRN
  Administered 2017-03-31 (×2): 1 via ORAL
  Administered 2017-04-01: 2 via ORAL
  Administered 2017-04-01: 1 via ORAL
  Administered 2017-04-01 – 2017-04-03 (×5): 2 via ORAL
  Filled 2017-03-30 (×2): qty 1
  Filled 2017-03-30 (×7): qty 2
  Filled 2017-03-30: qty 1

## 2017-03-30 MED ORDER — LORAZEPAM 1 MG PO TABS
1.0000 mg | ORAL_TABLET | Freq: Four times a day (QID) | ORAL | Status: DC | PRN
  Administered 2017-03-30 – 2017-04-02 (×8): 1 mg via ORAL
  Filled 2017-03-30 (×9): qty 1

## 2017-03-30 MED ORDER — ENSURE ENLIVE PO LIQD
237.0000 mL | Freq: Three times a day (TID) | ORAL | Status: DC
  Administered 2017-03-30 – 2017-04-02 (×7): 237 mL via ORAL

## 2017-03-30 MED ORDER — KETOROLAC TROMETHAMINE 15 MG/ML IJ SOLN
15.0000 mg | Freq: Four times a day (QID) | INTRAMUSCULAR | Status: DC | PRN
  Administered 2017-03-30 – 2017-03-31 (×4): 15 mg via INTRAVENOUS
  Filled 2017-03-30 (×4): qty 1

## 2017-03-30 MED ORDER — SODIUM CHLORIDE 0.9 % IV SOLN
INTRAVENOUS | Status: DC
  Administered 2017-03-30 – 2017-04-03 (×9): via INTRAVENOUS

## 2017-03-30 NOTE — Consult Note (Addendum)
PULMONARY / CRITICAL CARE MEDICINE   Name: Kristopher [name] MRN: [id number] DOB: 1960-05-20    ADMISSION DATE:  03/28/2017 CONSULTATION DATE:  01/19  PT PROFILE:   48 M recently treated at [facility] for RLL necrotizing pneumonia effusion.  He was discharged home on 03/04/17.  He presented again to the [facility] ED 03/28/17 with profound weakness and exertional dyspnea.  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  He was transferred to ICU/SDU on 03/29/17 for chest tube placement.  MAJOR EVENTS/TEST RESULTS: 12/22-12/25/18 hospitalized for RLL PNA 12/23 CT chest: Extensive RLL necrotizing pneumonia with small right parapneumonic effusion. --------------------------------------------------------------------------------------------------------------------------- 01/18 Readmitted with malaise, weakness, exertional dyspnea.  CXR and CT chest revealed massive right pleural effusion 01/18 CT chest: Massive right-sided empyema with near complete passive atelectasis of the right lung 01/19 right chest tube placement.  3500 cc pus drained immediately.  01/19 Pleural fluid: LDH > 10,000, WBC 46k, 95% neutrophils 01/20 No distress. Persistent intermittent air leak in chest tube chamber. Transferred to [place] floor 01/20 CT chest: Decreasing size of what remains a large right empyema following chest tube placement, with some re-expansion of the right lung.  Persistent small to moderate pneumothorax.  Considerable residual atelectasis and airspace consolidation is noted, particularly in RLL.  INDWELLING DEVICES:: R chest tube 01/19 >>   MICRO DATA: MRSA PCR 01/19 >>  Pleural fluid 01/19 >>  Blood 01/18 >> 1/2 GPC (staph, not aureus on [employer]) >>   ANTIMICROBIALS:  Vanc 01/18 >> 01/19 Pip-tazo 01/18 >>     SUBJECTIVE:  No distress.  Mild right sided chest discomfort.  Otherwise, no complaints.  VITAL SIGNS: BP 110/78   Pulse 87   Temp 98.7  F (37.1 C) (Oral)   Resp (!) 41   Ht 5\' 11"  (1.803 m)   Wt 58.4 kg (128 lb 12 oz)   SpO2 96%   BMI 17.96 kg/m   HEMODYNAMICS:    VENTILATOR SETTINGS:    INTAKE / OUTPUT: I/O last 3 completed shifts: In: 2588.8 [P.O.:240; I.V.:2348.8] Out: 0175 [Urine:1601; Chest Tube:4850]  PHYSICAL EXAMINATION: General: Cachectic, unkempt, NAD Neuro: CNs intact, motor/sensory intact HEENT: NCAT, sclerae white, severe temporal wasting Cardiovascular: Regular, no M Lungs: Minutes breath sounds on right posteriorly, no wheezes Abdomen: Scaphoid, nontender, bowel sounds present Ext: No clubbing cyanosis or edema  LABS:  BMET Recent Labs  Lab 03/28/17 1839 03/29/17 0327 03/30/17 0449  NA 127* 129* 127*  K 4.4 4.1 4.1  CL 88* 92* 91*  CO2 26 27 29   BUN 6 6 6   CREATININE 0.45* 0.48* 0.48*  GLUCOSE 116* 96 95    Electrolytes Recent Labs  Lab 03/28/17 1839 03/29/17 0327 03/30/17 0449  CALCIUM 8.7* 8.6* 8.4*  MG  --   --  1.8  PHOS  --   --  4.1    CBC Recent Labs  Lab 03/28/17 1839 03/29/17 0327 03/30/17 0449  WBC 15.9* 11.1* 10.7*  HGB 12.2* 10.9* 12.2*  HCT 36.6* 31.3* 35.8*  PLT 644* 588* 553*    Coag's No results for input(s): APTT, INR in the last 168 hours.  Sepsis Markers Recent Labs  Lab 03/28/17 2044  LATICACIDVEN 1.3    ABG No results for input(s): PHART, PCO2ART, PO2ART in the last 168 hours.  Liver Enzymes Recent Labs  Lab 03/28/17 1839  AST 36  ALT 25  ALKPHOS 107  BILITOT 0.9  ALBUMIN 2.7*    Cardiac Enzymes  Recent Labs  Lab 03/28/17 1839  TROPONINI <0.03    Glucose Recent Labs  Lab 03/29/17 0932  GLUCAP 143*    CXR: Small persistent pneumothorax, extensive consolidation of RLL.  Chest tube remains in good position.   ASSESSMENT / PLAN: RLL necrotizing pneumonia Readmitted with tension empyema, now S/P chest tube placement Severe protein-calorie malnutrition with cachexia Chronic alcohol abuse -no evidence of  withdrawal  Transfer to [place] today Continue Pip-tazo and narrow as indicated by micro results Continue to advance nutrition as able Discontinue CIWA protocol Low-dose lorazepam as needed Continue analgesics Thoracic surgery consultation has been placed.  He might still require VATS with decortication to allow for reexpansion of RLL and complete cleanout of pleural space  - Please make sure Dr Genevive Bi is notified 01/21  PCCM service will continue to follow after transfer   Merton Border, MD PCCM service Mobile [phone] Pager [phone] 03/30/2017 12:33 PM

## 2017-03-30 NOTE — Progress Notes (Signed)
Drank 100% strawberry ensure, he also ate all of his lunch.  He wants to gain weight.

## 2017-03-30 NOTE — Plan of Care (Signed)
Patient requested medication for chest wall pain.  Morphine given x 1.  Ibuprofen given x 2 for headache.  Patient slept most of shift.  Makes needs known. Minimal drainage noted from chest tube (150 cc's).  Will continue to monitor.

## 2017-03-30 NOTE — Progress Notes (Signed)
PULMONARY / CRITICAL CARE MEDICINE   Name: Kristopher [name] MRN: [id number] DOB: March 15, 1960    ADMISSION DATE:  03/28/2017 CONSULTATION DATE:  01/19  PT PROFILE:   52 M recently treated at [facility] for RLL necrotizing pneumonia effusion.  He was discharged home on 03/04/17.  He presented again to the [facility] ED 03/28/17 with profound weakness and exertional dyspnea.  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  He was transferred to ICU/SDU on 03/29/17 for chest tube placement.  MAJOR EVENTS/TEST RESULTS: 12/22-12/25/18 hospitalized for RLL PNA 12/23 CT chest: Extensive RLL necrotizing pneumonia with small right parapneumonic effusion. 01/18 readmitted with malaise, weakness, exertional dyspnea.  CXR and CT chest revealed massive right pleural effusion 01/19 right chest tube placement.  3500 cc pus drained immediately. 01/20 Transfer to [place]:: Chest tube 01/19 >>   MICRO DATA: MRSA PCR 01/19 >>  Pleural fluid 01/19 >>  Blood 01/18 >>   ANTIMICROBIALS:  Vanc 01/18 >> 01/19 Pip-tazo 01/18 >>    HISTORY OF PRESENT ILLNESS:   As above.  Upon my initial evaluation, he was remarkably comfortable appearing and had saturations in the mid 90s on room air.  He was denying pleuritic chest pain.  His chief complaint was just general malaise.   SUBJECTIVE: No acute issues overnight. Medicated for pain. CT drained ~150cc of serous sanguinous fluid.     VITAL SIGNS: BP 117/88   Pulse 81   Temp 98.7 F (37.1 C) (Oral)   Resp (!) 23   Ht 5\' 11"  (1.803 m)   Wt 58.4 kg (128 lb 12 oz)   SpO2 94%   BMI 17.96 kg/m   HEMODYNAMICS:    VENTILATOR SETTINGS:    INTAKE / OUTPUT: I/O last 3 completed shifts: In: 1448.8 [I.V.:1448.8] Out: 5784 [Urine:1001; Chest Tube:4700]  PHYSICAL EXAMINATION: General: Cachectic, unkempt, NAD Neuro: CNs intact, motor/sensory intact HEENT: NCAT, sclerae white, severe temporal  wasting Cardiovascular: Regular, no M Lungs:diminished breath sounds on the right, no wheezes Abdomen: Scaphoid, nontender, bowel sounds present Ext: No clubbing cyanosis or edema  LABS:  BMET Recent Labs  Lab 03/28/17 1839 03/29/17 0327 03/30/17 0449  NA 127* 129* 127*  K 4.4 4.1 4.1  CL 88* 92* 91*  CO2 26 27 29   BUN 6 6 6   CREATININE 0.45* 0.48* 0.48*  GLUCOSE 116* 96 95    Electrolytes Recent Labs  Lab 03/28/17 1839 03/29/17 0327 03/30/17 0449  CALCIUM 8.7* 8.6* 8.4*  MG  --   --  1.8  PHOS  --   --  4.1    CBC Recent Labs  Lab 03/28/17 1839 03/29/17 0327 03/30/17 0449  WBC 15.9* 11.1* 10.7*  HGB 12.2* 10.9* 12.2*  HCT 36.6* 31.3* 35.8*  PLT 644* 588* 553*    Coag's No results for input(s): APTT, INR in the last 168 hours.  Sepsis Markers Recent Labs  Lab 03/28/17 2044  LATICACIDVEN 1.3    ABG No results for input(s): PHART, PCO2ART, PO2ART in the last 168 hours.  Liver Enzymes Recent Labs  Lab 03/28/17 1839  AST 36  ALT 25  ALKPHOS 107  BILITOT 0.9  ALBUMIN 2.7*    Cardiac Enzymes Recent Labs  Lab 03/28/17 1839  TROPONINI <0.03    Glucose Recent Labs  Lab 03/29/17 0932  GLUCAP 143*    CXR: After chest tube placement, there has been significant drainage of the right empyema.  He has  a small pneumothorax and mild to moderate amount of residual fluid.   ASSESSMENT  Recent RLL necrotizing pneumonia Readmitted with tension empyema, now S/P chest tube placement Severe protein-calorie malnutrition with cachexia Chronic alcohol abuse  PLAN Transfer to [place] and narrow as indicated by micro results Nutritional consult Advance nutrition as able Continue CIWA protocol Toradol and hydrocodone for pain  Anniah Glick S. [facility] ANP-BC Pulmonary and Critical Care Medicine [facility] Pager [phone] or [phone]  NB: This document was prepared using Dragon voice recognition software and may include  unintentional dictation errors.     03/30/2017, 6:39 AM

## 2017-03-30 NOTE — Progress Notes (Signed)
Initial Nutrition Assessment  DOCUMENTATION CODES:   Severe malnutrition in context of chronic illness, Underweight  INTERVENTION:  Provide Ensure Enlive po TID, each supplement provides 350 kcal and 20 grams of protein.  Continue MVI daily, folic acid 1 mg daily, thiamine 100 mg daily in setting of EtOH abuse.  Encouraged adequate intake of calories and protein at meals. Discussed small, frequent meals if patient is having difficulty breathing and cannot finish a regular-sized meal.  NUTRITION DIAGNOSIS:   Severe Malnutrition related to chronic illness(EtOH abuse, recurrent necrotizing PNA with empyema) as evidenced by severe fat depletion, severe muscle depletion.  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   Met with patient at bedside. He is known to this RD from previous admission in December. He reports his appetite had become really good during his last hospitalization, but decreased when he got home after being discharged. He was having worsening shortness of breath and difficulty eating. He had started eating small snack-like meals throughout the day. Also enjoys Ensure, but was not able to drink it regularly at home. He denies any N/V, abdominal pain, constipation/diarrhea, difficulty chewing/swallowing.  UBW was 140 lbs. Patient was down to 121.4 lbs on 03/02/2017. He has now gained up to 128.8 lbs.  Meal Completion: 90-100%  Medications reviewed and include: MVI daily, thiamine 341 mg daily, folic acid 1 mg daily, NS @ 75 mL/hr, Zosyn.  Labs  reviewed: Chloride 91, Creatinine 0.48. Potassium, Magnesium, and Phosphorus are WNL.  NUTRITION - FOCUSED PHYSICAL EXAM:    Most Recent Value  Orbital Region  Severe depletion  Upper Arm Region  Severe depletion  Thoracic and Lumbar Region  Severe depletion  Buccal Region  Severe depletion  Temple Region  Severe depletion  Clavicle Bone Region  Severe depletion  Clavicle and Acromion Bone Region  Severe depletion  Scapular Bone Region  Severe depletion  Dorsal Hand  Severe depletion  Patellar Region  Moderate depletion  Anterior Thigh Region  Moderate depletion  Posterior Calf Region  Moderate depletion  Edema (RD Assessment)  None  Hair  Reviewed  Eyes  Reviewed  Mouth  Reviewed  Skin  Reviewed  Nails  Reviewed     Diet Order:  Diet regular Room service appropriate? Yes; Fluid consistency: Thin  EDUCATION NEEDS:   Education needs have been addressed  Skin:  Skin Assessment: Reviewed RN Assessment  Last BM:  03/28/2017  Height:   Ht Readings from Last 1 Encounters:  03/29/17 5' 11"  (1.803 m)    Weight:   Wt Readings from Last 1 Encounters:  03/29/17 128 lb 12 oz (58.4 kg)    Ideal Body Weight:  78.2 kg  BMI:  Body mass index is 17.96 kg/m.  Estimated Nutritional Needs:   Kcal:  1860-2145 (MSJ x 1.3-1.5)  Protein:  85-100 grams (1.5-1.7 grams/kg)  Fluid:  1.7-2 L/day (30-35 mL/kg)  [name], MS, RD, LDN Office: [phone] Pager: [phone] After Hours/Weekend Pager: [phone]

## 2017-03-30 NOTE — Progress Notes (Signed)
[facility] NAME: Kristopher [name]    MR#:  [id number]  DATE OF BIRTH:  1960/06/20  SUBJECTIVE:   Patient feels much better this morning. Chest tube placed yesterday.  REVIEW OF SYSTEMS:    Review of Systems  Constitutional: Negative for fever, chills weight loss Positive for generalized weakness and fatigue HENT: Negative for ear pain, nosebleeds, congestion, facial swelling, rhinorrhea, neck pain, neck stiffness and ear discharge.   Respiratory: Pre-shortness of breath and cough Cardiovascular: Negative for chest pain, palpitations and leg swelling.  Gastrointestinal: Negative for heartburn, abdominal pain, vomiting, diarrhea or consitpation Genitourinary: Negative for dysuria, urgency, frequency, hematuria Musculoskeletal: Negative for back pain or joint pain Neurological: Negative for dizziness, seizures, syncope, focal weakness,  numbness and headaches.  Hematological: Does not bruise/bleed easily.  Psychiatric/Behavioral: Negative for hallucinations, confusion, dysphoric mood    Tolerating Diet: yes      DRUG ALLERGIES:   Allergies  Allergen Reactions  . Percocet [Oxycodone-Acetaminophen] Itching    VITALS:  Blood pressure 110/78, pulse 87, temperature 98.7 F (37.1 C), temperature source Oral, resp. rate (!) 41, height 5\' 11"  (1.803 m), weight 58.4 kg (128 lb 12 oz), SpO2 96 %.  PHYSICAL EXAMINATION:  Constitutional: Appears thin. No distress. HENT: Normocephalic. Marland Kitchen Oropharynx is clear and moist.  Eyes: Conjunctivae and EOM are normal. PERRLA, no scleral icterus.  Neck: Normal ROM. Neck supple. No JVD. No tracheal deviation. CVS: RRR, S1/S2 +, no murmurs, no gallops, no carotid bruit.  Pulmonary: Chest tube placed with good breath sounds  Abdominal: Soft. BS +,  no distension, tenderness, rebound or guarding.  Musculoskeletal: Normal range of motion. No edema and no tenderness.  Neuro: Alert. CN 2-12 grossly  intact. No focal deficits. Skin: Skin is warm and dry. No rash noted. Psychiatric: Normal mood and affect.      LABORATORY PANEL:   CBC Recent Labs  Lab 03/30/17 0449  WBC 10.7*  HGB 12.2*  HCT 35.8*  PLT 553*   ------------------------------------------------------------------------------------------------------------------  Chemistries  Recent Labs  Lab 03/28/17 1839  03/30/17 0449  NA 127*   < > 127*  K 4.4   < > 4.1  CL 88*   < > 91*  CO2 26   < > 29  GLUCOSE 116*   < > 95  BUN 6   < > 6  CREATININE 0.45*   < > 0.48*  CALCIUM 8.7*   < > 8.4*  MG  --   --  1.8  AST 36  --   --   ALT 25  --   --   ALKPHOS 107  --   --   BILITOT 0.9  --   --    < > = values in this interval not displayed.   ------------------------------------------------------------------------------------------------------------------  Cardiac Enzymes Recent Labs  Lab 03/28/17 1839  TROPONINI <0.03   ------------------------------------------------------------------------------------------------------------------  RADIOLOGY:  Dg Chest 2 View  Result Date: 03/28/2017 CLINICAL DATA:  Patient reports SOB onset today. No known heart or lung conditions. Current smoker. EXAM: CHEST  2 VIEW COMPARISON:  Chest CT 03/02/2017, chest radiograph 03/04/2017 FINDINGS: Normal cardiac silhouette. There is continued increase in fluid within the RIGHT hemithorax. The RIGHT hemithorax is near completely opacified (95%). This is increased significantly from comparison chest radiograph. There is mild mass effect upon the mediastinum. IMPRESSION: 1. Increasing pleural fluid in the RIGHT hemithorax with near complete opacification of the RIGHT hemithorax. This is increased significantly from 03/04/2017  where patient was found to have pneumonia on chest CT. 2. Mild shift of the mediastinum by the increasing pleural fluid. 3. Consider thoracentesis with cytology to evaluate for pulmonary malignancy. Electronically  Signed   By: Suzy Bouchard M.D.   On: 03/28/2017 19:11   Ct Chest Wo Contrast  Result Date: 03/28/2017 CLINICAL DATA:  57 year old male recently treated for pneumonia but failed to improve. EXAM: CT CHEST WITHOUT CONTRAST TECHNIQUE: Multidetector CT imaging of the chest was performed following the standard protocol without IV contrast. COMPARISON:  Chest CT 03/02/2017. FINDINGS: Cardiovascular: Heart size is normal. There is no significant pericardial fluid, thickening or pericardial calcification. There is aortic atherosclerosis, as well as atherosclerosis of the great vessels of the mediastinum and the coronary arteries, including calcified atherosclerotic plaque in the left anterior descending, left circumflex and right coronary arteries. Mediastinum/Nodes: No pathologically enlarged mediastinal or hilar lymph nodes. Please note that accurate exclusion of hilar adenopathy is limited on noncontrast CT scans. Esophagus is unremarkable in appearance. No axillary lymphadenopathy. Lungs/Pleura: Massive right-sided pleural effusion with nondependent gas significantly increased in size compared to the prior study, compatible with an empyema. This is associated with extensive passive atelectasis throughout the right lung, with evidence of persistent consolidation portions of the right lower lobe. Left lung is clear. No pleural effusions. Upper Abdomen: Aortic atherosclerosis. Musculoskeletal: There are no aggressive appearing lytic or blastic lesions noted in the visualized portions of the skeleton. IMPRESSION: 1. Massive right-sided empyema with near complete passive atelectasis of the right lung. Residual airspace consolidation in the right lower lobe, compatible with persistent pneumonia. 2. Aortic atherosclerosis, in addition to three-vessel coronary artery disease. Please note that although the presence of coronary artery calcium documents the presence of coronary artery disease, the severity of this disease  and any potential stenosis cannot be assessed on this non-gated CT examination. Assessment for potential risk factor modification, dietary therapy or pharmacologic therapy may be warranted, if clinically indicated. Aortic Atherosclerosis (ICD10-I70.0). Electronically Signed   By: Vinnie Langton M.D.   On: 03/28/2017 19:42   Dg Chest Port 1 View  Result Date: 03/30/2017 CLINICAL DATA:  57 year old male under evaluation for chest tube placement. EXAM: PORTABLE CHEST 1 VIEW COMPARISON:  Chest x-ray 03/29/2017. FINDINGS: Previously noted right-sided chest tube remains in a similar position with tip near the apex of the right hemithorax. Previously noted right-sided hydropneumothorax has decreased in size, now with only a trace residual pneumothorax component and decreasing moderate-sized fluid component which appears partially loculated in the lower lateral right hemithorax. Areas of atelectasis and/or consolidation throughout the right mid to lower lung persist, although aeration appears slightly improved. Left lung is well aerated. No left pleural effusion. No evidence of pulmonary edema. Heart size is normal. Upper mediastinal contours are within normal limits. IMPRESSION: 1. Stable position of right-sided chest tube with slight decreased size of right-sided empyema. Electronically Signed   By: Vinnie Langton M.D.   On: 03/30/2017 07:30   Dg Chest Port 1 View  Result Date: 03/29/2017 CLINICAL DATA:  Chest tube placement EXAM: PORTABLE CHEST 1 VIEW COMPARISON:  03/28/2017 FINDINGS: Interval placement of large bore chest tube on the right in good position. Significant improvement in right effusion. There remains moderate amount of loculated pleural fluid in the lateral base. 15 mm pneumothorax in the lateral apex. Partial re-expansion of the right upper lobe and a portion of the right lower lobe. Underlying pneumonia is suspected. Tumor not excluded. Left lung remains clear.  No heart  failure.  Heart size  normal. IMPRESSION: Interval placement of right chest tube with marked improvement in large right effusion. There remains a moderate loculated effusion in the right lateral base. 15 mm right lateral apex pneumothorax Re-expansion of right upper lobe. Partial re-expansion right lower lobe with underlying infiltrate present based on prior studies. Electronically Signed   By: Franchot Gallo M.D.   On: 03/29/2017 13:39     ASSESSMENT AND PLAN:   57 year old male with EtOH abuse who presents with shortness of breath and found to have large empyema and CT scan  1. Tension Empyema/right lateral apex pneumothorax with reexpansion of right upper lobe: Patient is status post chest tube placement and doing well. Continue Zosyn Follow-up cultures   2. Hyponatremia: This is due to empyema and dehydration Follow sodium levels  3. EtOH abuse: Continue CIWA protocol  4. Severe protein calorie malnutrition: Dietary consultation requested  5. One out of 2 gram-positive cocci staph species which is contaminant  D/w dr simonds Management plans discussed with the patient and he is in agreement.  CODE STATUS: FULL  TOTAL TIME TAKING CARE OF THIS PATIENT: 24 minutes.     POSSIBLE D/C 3-5 days, DEPENDING ON CLINICAL CONDITION.   Mareta Chesnut M.D on 03/30/2017 at 10:14 AM  Between 7am to 6pm - Pager - [phone] After 6pm go to [email or web address] - password [email or web address] [facility]  Office  [phone]  CC: Primary care physician; Patient, No Pcp Per  Note: This dictation was prepared with Dragon dictation along with smaller phrase technology. Any transcriptional errors that result from this process are unintentional.

## 2017-03-30 NOTE — Consult Note (Signed)
Surgical Consultation  03/30/2017  RICHRD KUZNIAR is an 57 y.o. male.   Referring Physician: Mody  CC:rt empyema  HPI: This a patient with a right-sided empyema.  He has had a history of a prior right pneumonia.  He was admitted to the hospital with a large empyema and the intensivist placed a chest tube.  Cultures are pending.  Patient feels better and has no shortness of breath at this time.  This consult is in anticipation of Dr. Marta Lamas returning tomorrow to discuss potential surgical options if needed.  Past Medical History:  Diagnosis Date  . Electric injury   . PNA (pneumonia)     Past Surgical History:  Procedure Laterality Date  . SKIN GRAFT      No family history on file.  No history of lung cancer in the family  Social History:  reports that he has quit smoking. he has never used smokeless tobacco. He reports that he drinks alcohol. He reports that he does not use drugs.  Allergies:  Allergies  Allergen Reactions  . Percocet [Oxycodone-Acetaminophen] Itching   Significant alcohol use. Medications reviewed.   Review of Systems:   Review of Systems  Constitutional: Positive for chills and fever.  HENT: Negative.   Eyes: Negative.   Respiratory: Positive for shortness of breath.   Cardiovascular: Negative.   Gastrointestinal: Negative.   Genitourinary: Negative.   Musculoskeletal: Negative.   Skin: Negative.   Neurological: Negative.   Endo/Heme/Allergies: Negative.   Psychiatric/Behavioral: Negative.      Physical Exam:  BP 106/71 (BP Location: Left Arm)   Pulse 87   Temp (!) 97.1 F (36.2 C) (Oral)   Resp 20   Ht 5' 11" (1.803 m)   Wt 128 lb 12 oz (58.4 kg)   SpO2 97%   BMI 17.96 kg/m   Physical Exam  Constitutional: He is oriented to person, place, and time.  Thin and cachectic appearing male no obvious distress vitals reviewed  HENT:  Head: Normocephalic and atraumatic.  Pulmonary/Chest: Effort normal. No respiratory distress.   Right-sided chest tube with no air leak serous fluid in tube  Abdominal: Soft. He exhibits no distension. There is no tenderness.  Neurological: He is alert and oriented to person, place, and time.  Skin: Skin is warm and dry. No erythema.  Vitals reviewed.     Results for orders placed or performed during the hospital encounter of 03/28/17 (from the past 48 hour(s))  CBC     Status: Abnormal   Collection Time: 03/28/17  6:39 PM  Result Value Ref Range   WBC 15.9 (H) 3.8 - 10.6 K/uL   RBC 4.01 (L) 4.40 - 5.90 MIL/uL   Hemoglobin 12.2 (L) 13.0 - 18.0 g/dL   HCT 36.6 (L) 40.0 - 52.0 %   MCV 91.3 80.0 - 100.0 fL   MCH 30.5 26.0 - 34.0 pg   MCHC 33.4 32.0 - 36.0 g/dL   RDW 13.9 11.5 - 14.5 %   Platelets 644 (H) 150 - 440 K/uL    Comment: Performed at [facility], [address], [place], [place] [address]  Comprehensive metabolic panel     Status: Abnormal   Collection Time: 03/28/17  6:39 PM  Result Value Ref Range   Sodium 127 (L) 135 - 145 mmol/L   Potassium 4.4 3.5 - 5.1 mmol/L    Comment: HEMOLYSIS AT THIS LEVEL MAY AFFECT RESULT   Chloride 88 (L) 101 - 111 mmol/L   CO2 26  22 - 32 mmol/L   Glucose, Bld 116 (H) 65 - 99 mg/dL   BUN 6 6 - 20 mg/dL   Creatinine, Ser 0.45 (L) 0.61 - 1.24 mg/dL   Calcium 8.7 (L) 8.9 - 10.3 mg/dL   Total Protein 7.8 6.5 - 8.1 g/dL   Albumin 2.7 (L) 3.5 - 5.0 g/dL   AST 36 15 - 41 U/L   ALT 25 17 - 63 U/L   Alkaline Phosphatase 107 38 - 126 U/L   Total Bilirubin 0.9 0.3 - 1.2 mg/dL   GFR calc non Af Amer >60 >60 mL/min   GFR calc Af Amer >60 >60 mL/min    Comment: (NOTE) The eGFR has been calculated using the CKD EPI equation. This calculation has not been validated in all clinical situations. eGFR's persistently <60 mL/min signify possible Chronic Kidney Disease.    Anion gap 13 5 - 15    Comment: Performed at [facility], [address] [place], [place] [address]  Troponin I     Status: None   Collection Time:  03/28/17  6:39 PM  Result Value Ref Range   Troponin I <0.03 <0.03 ng/mL    Comment: Performed at [facility], [address] [place], [place] [address]  Culture, blood (routine x 2)     Status: None (Preliminary result)   Collection Time: 03/28/17  8:35 PM  Result Value Ref Range   Specimen Description BLOOD RIGHT FOREARM    Special Requests      BOTTLES DRAWN AEROBIC AND ANAEROBIC Blood Culture adequate volume   Culture  Setup Time      Organism ID to follow GRAM POSITIVE COCCI ANAEROBIC BOTTLE ONLY CRITICAL RESULT CALLED TO, READ BACK BY AND VERIFIED WITH: DAVID BESANTI AT [id number] 03/30/17.PMH Performed at [facility], [address] [place], [place] [address]    Culture GRAM POSITIVE COCCI    Report Status PENDING   Blood Culture ID Panel (Reflexed)     Status: Abnormal   Collection Time: 03/28/17  8:35 PM  Result Value Ref Range   Enterococcus species NOT DETECTED NOT DETECTED   Listeria monocytogenes NOT DETECTED NOT DETECTED   Staphylococcus species DETECTED (A) NOT DETECTED    Comment: Methicillin (oxacillin) susceptible coagulase negative staphylococcus. Possible blood culture contaminant (unless isolated from more than one blood culture draw or clinical case suggests pathogenicity). No antibiotic treatment is indicated for blood  culture contaminants. CRITICAL RESULT CALLED TO, READ BACK BY AND VERIFIED WITH: DAVID BESANTI AT [id number] 03/30/17.PMH    Staphylococcus aureus NOT DETECTED NOT DETECTED   Methicillin resistance NOT DETECTED NOT DETECTED   Streptococcus species NOT DETECTED NOT DETECTED   Streptococcus agalactiae NOT DETECTED NOT DETECTED   Streptococcus pneumoniae NOT DETECTED NOT DETECTED   Streptococcus pyogenes NOT DETECTED NOT DETECTED   Acinetobacter baumannii NOT DETECTED NOT DETECTED   Enterobacteriaceae species NOT DETECTED NOT DETECTED   Enterobacter cloacae complex NOT DETECTED NOT DETECTED   Escherichia coli NOT DETECTED NOT  DETECTED   Klebsiella oxytoca NOT DETECTED NOT DETECTED   Klebsiella pneumoniae NOT DETECTED NOT DETECTED   Proteus species NOT DETECTED NOT DETECTED   Serratia marcescens NOT DETECTED NOT DETECTED   Haemophilus influenzae NOT DETECTED NOT DETECTED   Neisseria meningitidis NOT DETECTED NOT DETECTED   Pseudomonas aeruginosa NOT DETECTED NOT DETECTED   Candida albicans NOT DETECTED NOT DETECTED   Candida glabrata NOT DETECTED NOT DETECTED   Candida krusei NOT DETECTED NOT DETECTED   Candida  parapsilosis NOT DETECTED NOT DETECTED   Candida tropicalis NOT DETECTED NOT DETECTED    Comment: Performed at [facility], [address] [place], [place] [address]  Lactic acid, plasma     Status: None   Collection Time: 03/28/17  8:44 PM  Result Value Ref Range   Lactic Acid, Venous 1.3 0.5 - 1.9 mmol/L    Comment: Performed at [facility], [address] [place], [place] [address]  Culture, blood (routine x 2)     Status: None (Preliminary result)   Collection Time: 03/28/17  8:50 PM  Result Value Ref Range   Specimen Description BLOOD RIGHT ANTECUBITAL    Special Requests      BOTTLES DRAWN AEROBIC AND ANAEROBIC Blood Culture adequate volume   Culture      NO GROWTH 2 DAYS Performed at [facility], [address] [place], [place] [address]    Report Status PENDING   CBC     Status: Abnormal   Collection Time: 03/29/17  3:27 AM  Result Value Ref Range   WBC 11.1 (H) 3.8 - 10.6 K/uL   RBC 3.46 (L) 4.40 - 5.90 MIL/uL   Hemoglobin 10.9 (L) 13.0 - 18.0 g/dL   HCT 31.3 (L) 40.0 - 52.0 %   MCV 90.5 80.0 - 100.0 fL   MCH 31.6 26.0 - 34.0 pg   MCHC 34.9 32.0 - 36.0 g/dL   RDW 14.0 11.5 - 14.5 %   Platelets 588 (H) 150 - 440 K/uL    Comment: Performed at [facility], [address] [place], [place] [address]  Basic metabolic panel     Status: Abnormal   Collection Time: 03/29/17  3:27 AM  Result Value Ref Range   Sodium 129 (L) 135 - 145 mmol/L    Potassium 4.1 3.5 - 5.1 mmol/L   Chloride 92 (L) 101 - 111 mmol/L   CO2 27 22 - 32 mmol/L   Glucose, Bld 96 65 - 99 mg/dL   BUN 6 6 - 20 mg/dL   Creatinine, Ser 0.48 (L) 0.61 - 1.24 mg/dL   Calcium 8.6 (L) 8.9 - 10.3 mg/dL   GFR calc non Af Amer >60 >60 mL/min   GFR calc Af Amer >60 >60 mL/min    Comment: (NOTE) The eGFR has been calculated using the CKD EPI equation. This calculation has not been validated in all clinical situations. eGFR's persistently <60 mL/min signify possible Chronic Kidney Disease.    Anion gap 10 5 - 15    Comment: Performed at [facility], [address] [place], [place] [address]  Glucose, capillary     Status: Abnormal   Collection Time: 03/29/17  9:32 AM  Result Value Ref Range   Glucose-Capillary 143 (H) 65 - 99 mg/dL  Body fluid cell count with differential     Status: Abnormal   Collection Time: 03/29/17 11:31 AM  Result Value Ref Range   Fluid Type-FCT PLEURAL    Color, Fluid PINK (A) YELLOW   Appearance, Fluid HAZY (A) CLEAR   WBC, Fluid 46,065 cu mm   Neutrophil Count, Fluid 95 %   Lymphs, Fluid 4 %   Monocyte-Macrophage-Serous Fluid 1 %   Eos, Fluid 0 %   Other Cells, Fluid 0 %    Comment: Performed at [facility], [address], [place], [place] [address]  Body fluid culture     Status: None (Preliminary result)   Collection Time: 03/29/17 11:31 AM  Result  Value Ref Range   Specimen Description      PLEURAL Performed at [facility], [address] [place], [place] [address]    Special Requests      NONE Performed at [facility], [place], [place] [address]    Gram Stain      ABUNDANT WBC PRESENT,BOTH PMN AND MONONUCLEAR NO ORGANISMS SEEN    Culture      NO GROWTH 1 DAY Performed at [facility], [place] [address], [place], [place] [address]    Report Status PENDING   Lactate dehydrogenase     Status: Abnormal   Collection Time: 03/29/17 11:31 AM  Result  Value Ref Range   LDH >10,000 (H) 98 - 192 U/L    Comment: HEMOLYSIS AT THIS LEVEL MAY AFFECT RESULT RESULT CONFIRMED BY MANUAL DILUTION SNJ Performed at [facility], [address] [place], [place] [address]   Basic metabolic panel     Status: Abnormal   Collection Time: 03/30/17  4:49 AM  Result Value Ref Range   Sodium 127 (L) 135 - 145 mmol/L   Potassium 4.1 3.5 - 5.1 mmol/L   Chloride 91 (L) 101 - 111 mmol/L   CO2 29 22 - 32 mmol/L   Glucose, Bld 95 65 - 99 mg/dL   BUN 6 6 - 20 mg/dL   Creatinine, Ser 0.48 (L) 0.61 - 1.24 mg/dL   Calcium 8.4 (L) 8.9 - 10.3 mg/dL   GFR calc non Af Amer >60 >60 mL/min   GFR calc Af Amer >60 >60 mL/min    Comment: (NOTE) The eGFR has been calculated using the CKD EPI equation. This calculation has not been validated in all clinical situations. eGFR's persistently <60 mL/min signify possible Chronic Kidney Disease.    Anion gap 7 5 - 15    Comment: Performed at [facility], [address] [place], [place] [address]  CBC     Status: Abnormal   Collection Time: 03/30/17  4:49 AM  Result Value Ref Range   WBC 10.7 (H) 3.8 - 10.6 K/uL   RBC 3.90 (L) 4.40 - 5.90 MIL/uL   Hemoglobin 12.2 (L) 13.0 - 18.0 g/dL   HCT 35.8 (L) 40.0 - 52.0 %   MCV 91.9 80.0 - 100.0 fL   MCH 31.2 26.0 - 34.0 pg   MCHC 34.0 32.0 - 36.0 g/dL   RDW 14.1 11.5 - 14.5 %   Platelets 553 (H) 150 - 440 K/uL    Comment: Performed at [facility], [address] [place], [place] [address]  Prealbumin     Status: Abnormal   Collection Time: 03/30/17  4:49 AM  Result Value Ref Range   Prealbumin 8.3 (L) 18 - 38 mg/dL    Comment: Performed at [facility] [address], [place], [place] [address]  Magnesium     Status: None   Collection Time: 03/30/17  4:49 AM  Result Value Ref Range   Magnesium 1.8 1.7 - 2.4 mg/dL    Comment: Performed at [facility], [address] [place], [place] [address]  Phosphorus     Status:  None   Collection Time: 03/30/17  4:49 AM  Result Value Ref Range   Phosphorus 4.1 2.5 - 4.6 mg/dL    Comment: Performed at [facility], [address], [place], [place] [address]   Dg Chest 2 View  Result Date: 03/28/2017 CLINICAL DATA:  Patient reports SOB  onset today. No known heart or lung conditions. Current smoker. EXAM: CHEST  2 VIEW COMPARISON:  Chest CT 03/02/2017, chest radiograph 03/04/2017 FINDINGS: Normal cardiac silhouette. There is continued increase in fluid within the RIGHT hemithorax. The RIGHT hemithorax is near completely opacified (95%). This is increased significantly from comparison chest radiograph. There is mild mass effect upon the mediastinum. IMPRESSION: 1. Increasing pleural fluid in the RIGHT hemithorax with near complete opacification of the RIGHT hemithorax. This is increased significantly from 03/04/2017 where patient was found to have pneumonia on chest CT. 2. Mild shift of the mediastinum by the increasing pleural fluid. 3. Consider thoracentesis with cytology to evaluate for pulmonary malignancy. Electronically Signed   By: Suzy Bouchard M.D.   On: 03/28/2017 19:11   Ct Chest Wo Contrast  Result Date: 03/30/2017 CLINICAL DATA:  57 year old male with history of empyema. Followup study. EXAM: CT CHEST WITHOUT CONTRAST TECHNIQUE: Multidetector CT imaging of the chest was performed following the standard protocol without IV contrast. COMPARISON:  Chest CT 03/28/2017. FINDINGS: Cardiovascular: Heart size is normal. There is no significant pericardial fluid, thickening or pericardial calcification. There is aortic atherosclerosis, as well as atherosclerosis of the great vessels of the mediastinum and the coronary arteries, including calcified atherosclerotic plaque in the left anterior descending, left circumflex and right coronary arteries. Mediastinum/Nodes: No pathologically enlarged mediastinal or hilar lymph nodes. Please note that accurate exclusion of  hilar adenopathy is limited on noncontrast CT scans. Esophagus is unremarkable in appearance. No axillary lymphadenopathy. Lungs/Pleura: There has been interval placement of a right-sided chest tube with tip near the apex of the right hemithorax. Previously noted large right empyema has significantly decreased in size. There continues to be multiple internal loculations within this complex gas and fluid collection in the right hemithorax. Some re-expansion of the right lung is evident, although there continues to be considerable atelectasis in portions of the right middle and lower lobe, as well as extensive residual airspace consolidation in the right lower lobe. Left lung remains clear. No left pleural effusion. Upper Abdomen: Aortic atherosclerosis. Musculoskeletal: There are no aggressive appearing lytic or blastic lesions noted in the visualized portions of the skeleton. IMPRESSION: 1. Decreasing size of what remains a large right empyema following chest tube placement, with some re-expansion of the right lung. Considerable residual atelectasis and airspace consolidation is noted, as discussed above. 2. Aortic atherosclerosis, in addition to 3 vessel coronary artery disease. Please note that although the presence of coronary artery calcium documents the presence of coronary artery disease, the severity of this disease and any potential stenosis cannot be assessed on this non-gated CT examination. Assessment for potential risk factor modification, dietary therapy or pharmacologic therapy may be warranted, if clinically indicated. Aortic Atherosclerosis (ICD10-I70.0). Electronically Signed   By: Vinnie Langton M.D.   On: 03/30/2017 10:12   Ct Chest Wo Contrast  Result Date: 03/28/2017 CLINICAL DATA:  57 year old male recently treated for pneumonia but failed to improve. EXAM: CT CHEST WITHOUT CONTRAST TECHNIQUE: Multidetector CT imaging of the chest was performed following the standard protocol without IV  contrast. COMPARISON:  Chest CT 03/02/2017. FINDINGS: Cardiovascular: Heart size is normal. There is no significant pericardial fluid, thickening or pericardial calcification. There is aortic atherosclerosis, as well as atherosclerosis of the great vessels of the mediastinum and the coronary arteries, including calcified atherosclerotic plaque in the left anterior descending, left circumflex and right coronary arteries. Mediastinum/Nodes: No pathologically enlarged mediastinal or hilar lymph nodes. Please note that accurate exclusion of hilar  adenopathy is limited on noncontrast CT scans. Esophagus is unremarkable in appearance. No axillary lymphadenopathy. Lungs/Pleura: Massive right-sided pleural effusion with nondependent gas significantly increased in size compared to the prior study, compatible with an empyema. This is associated with extensive passive atelectasis throughout the right lung, with evidence of persistent consolidation portions of the right lower lobe. Left lung is clear. No pleural effusions. Upper Abdomen: Aortic atherosclerosis. Musculoskeletal: There are no aggressive appearing lytic or blastic lesions noted in the visualized portions of the skeleton. IMPRESSION: 1. Massive right-sided empyema with near complete passive atelectasis of the right lung. Residual airspace consolidation in the right lower lobe, compatible with persistent pneumonia. 2. Aortic atherosclerosis, in addition to three-vessel coronary artery disease. Please note that although the presence of coronary artery calcium documents the presence of coronary artery disease, the severity of this disease and any potential stenosis cannot be assessed on this non-gated CT examination. Assessment for potential risk factor modification, dietary therapy or pharmacologic therapy may be warranted, if clinically indicated. Aortic Atherosclerosis (ICD10-I70.0). Electronically Signed   By: Vinnie Langton M.D.   On: 03/28/2017 19:42   Dg  Chest Port 1 View  Result Date: 03/30/2017 CLINICAL DATA:  57 year old male under evaluation for chest tube placement. EXAM: PORTABLE CHEST 1 VIEW COMPARISON:  Chest x-ray 03/29/2017. FINDINGS: Previously noted right-sided chest tube remains in a similar position with tip near the apex of the right hemithorax. Previously noted right-sided hydropneumothorax has decreased in size, now with only a trace residual pneumothorax component and decreasing moderate-sized fluid component which appears partially loculated in the lower lateral right hemithorax. Areas of atelectasis and/or consolidation throughout the right mid to lower lung persist, although aeration appears slightly improved. Left lung is well aerated. No left pleural effusion. No evidence of pulmonary edema. Heart size is normal. Upper mediastinal contours are within normal limits. IMPRESSION: 1. Stable position of right-sided chest tube with slight decreased size of right-sided empyema. Electronically Signed   By: Vinnie Langton M.D.   On: 03/30/2017 07:30   Dg Chest Port 1 View  Result Date: 03/29/2017 CLINICAL DATA:  Chest tube placement EXAM: PORTABLE CHEST 1 VIEW COMPARISON:  03/28/2017 FINDINGS: Interval placement of large bore chest tube on the right in good position. Significant improvement in right effusion. There remains moderate amount of loculated pleural fluid in the lateral base. 15 mm pneumothorax in the lateral apex. Partial re-expansion of the right upper lobe and a portion of the right lower lobe. Underlying pneumonia is suspected. Tumor not excluded. Left lung remains clear.  No heart failure.  Heart size normal. IMPRESSION: Interval placement of right chest tube with marked improvement in large right effusion. There remains a moderate loculated effusion in the right lateral base. 15 mm right lateral apex pneumothorax Re-expansion of right upper lobe. Partial re-expansion right lower lobe with underlying infiltrate present based on  prior studies. Electronically Signed   By: Franchot Gallo M.D.   On: 03/29/2017 13:39    Assessment/Plan:  Films are personally reviewed.  Patient with a large right pleural empyema.  He also has a pneumonia and airspace disease.  It is much improved with the chest tube in place but is not completely reinflated.  CT scan is personally reviewed.  I will alert Dr. Faith Rogue to the need for evaluation by a CT surgeon in order to achieve full reexpansion of the lung.  He will return tomorrow.  Florene Glen, MD, FACS

## 2017-03-30 NOTE — Progress Notes (Signed)
Came to me after VT Chest and out of ICU. VSS. Aox4. Chest tude intact draining serosanguanous drainage out R chest tube. No dyspnea. On con't pulse ox. On room air. Doing well post transfer from ICU.

## 2017-03-30 NOTE — Progress Notes (Signed)
PHARMACY - PHYSICIAN COMMUNICATION CRITICAL VALUE ALERT - BLOOD CULTURE IDENTIFICATION (BCID)  Kristopher [name] is an 57 y.o. male who presented to [facility] on 03/28/2017 with a chief complaint of SOB  Assessment:  Tachycardic, tachypneic, WBC 15.9 >> 11.1, CT chest massive empyema on RS of chest  (include suspected source if known)  Name of physician (or Provider) Contacted: Harless Nakayama  Current antibiotics: Zosyn   Changes to prescribed antibiotics recommended:  Patient is on recommended antibiotics - No changes needed 1/4 bottles GPC staph (MSSA) possibly contamination -- will continue w/ broad spectrum agent until cultures finalize  Results for orders placed or performed during the hospital encounter of 03/28/17  Blood Culture ID Panel (Reflexed) (Collected: 03/28/2017  8:35 PM)  Result Value Ref Range   Enterococcus species NOT DETECTED NOT DETECTED   Listeria monocytogenes NOT DETECTED NOT DETECTED   Staphylococcus species DETECTED (A) NOT DETECTED   Staphylococcus aureus NOT DETECTED NOT DETECTED   Methicillin resistance NOT DETECTED NOT DETECTED   Streptococcus species NOT DETECTED NOT DETECTED   Streptococcus agalactiae NOT DETECTED NOT DETECTED   Streptococcus pneumoniae NOT DETECTED NOT DETECTED   Streptococcus pyogenes NOT DETECTED NOT DETECTED   Acinetobacter baumannii NOT DETECTED NOT DETECTED   Enterobacteriaceae species NOT DETECTED NOT DETECTED   Enterobacter cloacae complex NOT DETECTED NOT DETECTED   Escherichia coli NOT DETECTED NOT DETECTED   Klebsiella oxytoca NOT DETECTED NOT DETECTED   Klebsiella pneumoniae NOT DETECTED NOT DETECTED   Proteus species NOT DETECTED NOT DETECTED   Serratia marcescens NOT DETECTED NOT DETECTED   Haemophilus influenzae NOT DETECTED NOT DETECTED   Neisseria meningitidis NOT DETECTED NOT DETECTED   Pseudomonas aeruginosa NOT DETECTED NOT DETECTED   Candida albicans NOT DETECTED NOT DETECTED   Candida glabrata NOT DETECTED NOT  DETECTED   Candida krusei NOT DETECTED NOT DETECTED   Candida parapsilosis NOT DETECTED NOT DETECTED   Candida tropicalis NOT DETECTED NOT DETECTED    Tobie Lords, PharmD, BCPS Clinical Pharmacist 03/30/2017

## 2017-03-31 ENCOUNTER — Inpatient Hospital Stay: Payer: 59

## 2017-03-31 DIAGNOSIS — Z720 Tobacco use: Secondary | ICD-10-CM

## 2017-03-31 DIAGNOSIS — J449 Chronic obstructive pulmonary disease, unspecified: Secondary | ICD-10-CM

## 2017-03-31 LAB — BASIC METABOLIC PANEL
Anion gap: 8 (ref 5–15)
BUN: 7 mg/dL (ref 6–20)
CALCIUM: 8.2 mg/dL — AB (ref 8.9–10.3)
CO2: 29 mmol/L (ref 22–32)
CREATININE: 0.46 mg/dL — AB (ref 0.61–1.24)
Chloride: 93 mmol/L — ABNORMAL LOW (ref 101–111)
GFR calc Af Amer: >60 mL/min (ref >60)
GLUCOSE: 100 mg/dL — AB (ref 65–99)
Potassium: 4.2 mmol/L (ref 3.5–5.1)
SODIUM: 130 mmol/L — AB (ref 135–145)

## 2017-03-31 LAB — CBC
HCT: 32.4 % — ABNORMAL LOW (ref 40.0–52.0)
Hemoglobin: 11.2 g/dL — ABNORMAL LOW (ref 13.0–18.0)
MCH: 31.3 pg (ref 26.0–34.0)
MCHC: 34.5 g/dL (ref 32.0–36.0)
MCV: 90.7 fL (ref 80.0–100.0)
PLATELETS: 561 10*3/uL — AB (ref 150–440)
RBC: 3.57 MIL/uL — ABNORMAL LOW (ref 4.40–5.90)
RDW: 13.8 % (ref 11.5–14.5)
WBC: 11.2 10*3/uL — ABNORMAL HIGH (ref 3.8–10.6)

## 2017-03-31 LAB — PROTIME-INR
INR: 1.12
PROTHROMBIN TIME: 14.3 s (ref 11.4–15.2)

## 2017-03-31 LAB — APTT: aPTT: 35 seconds (ref 24–36)

## 2017-03-31 LAB — ABO/RH: ABO/RH(D): B POS

## 2017-03-31 LAB — PREPARE RBC (CROSSMATCH)

## 2017-03-31 MED ORDER — HEPARIN SODIUM (PORCINE) 5000 UNIT/ML IJ SOLN
5000.0000 [IU] | Freq: Three times a day (TID) | INTRAMUSCULAR | Status: AC
  Administered 2017-04-01 – 2017-04-02 (×6): 5000 [IU] via SUBCUTANEOUS
  Filled 2017-03-31 (×6): qty 1

## 2017-03-31 MED ORDER — SENNOSIDES-DOCUSATE SODIUM 8.6-50 MG PO TABS
1.0000 | ORAL_TABLET | Freq: Every day | ORAL | Status: DC
  Administered 2017-03-31 – 2017-04-02 (×3): 1 via ORAL
  Filled 2017-03-31 (×3): qty 1

## 2017-03-31 NOTE — Evaluation (Signed)
Physical Therapy Evaluation Patient Details Name: Kristopher [name] MRN: [id number] DOB: 09-14-1960 Today's Date: 03/31/2017   History of Present Illness  Pt is a25 y.o.malewith a known history ofpneumonia requiring admission in December. Patient has been admitted twice for same. At that time he was treated for HCAP pneumonia. He was discharged home on Augmentin, continued to not feel well and started getting short of breath with exertion. Patient has not had any cough, fever, or chills however he does not feel well. Pt reports he used to drink 12 beers a day but now he is cut down to 6 beers a day. He denies any nausea vomiting or diarrhea.  During this admission pt had a R chest tube placed 03/29/17.  Assessment includes: tension empyema/right lateral apex pneumothorax with reexpansion of right upper lobe status post chest tube placement, hyponatremia, EtOH abuse on CIWA protocol, and severe protein calorie malnutrition.    Clinical Impression  Pt presents with deficits in strength, transfers,  gait, balance, and activity tolerance.  Per nursing, Dr. Ellan Lambert with ambulating pt with chest tube off of suction.  Nursing entered room to seal chest tube for amb.  Pt Ind with bed mobility tasks and SBA with transfers with increased effort required but good stability.  Pt able to amb 30' with RW to assist with hanging chest tube canister with good stability.  Pt's SpO2 dropped on room air from a baseline of 95% to 92% after amb with HR increasing from 75 to 78 bpm.  Pt reported some minimal SOB with amb but reports is much improved since admission.  Pt will benefit from HHPT services upon discharge to safely address above deficits for decreased caregiver assistance and eventual return to PLOF.      Follow Up Recommendations Home health PT    Equipment Recommendations  None recommended by PT(Pt reports has access to ADs if need arises)    Recommendations for Other Services       Precautions /  Restrictions Precautions Precautions: None Precaution Comments: R-sided chest tube Restrictions Weight Bearing Restrictions: No      Mobility  Bed Mobility Overal bed mobility: Independent                Transfers Overall transfer level: Needs assistance Equipment used: Rolling walker (2 wheeled) Transfers: Sit to/from Stand Sit to Stand: Supervision         General transfer comment: Pt steady with transfers but required some increased effort to complete tasks with min verbal cues for sequencing  Ambulation/Gait Ambulation/Gait assistance: Supervision Ambulation Distance (Feet): 30 Feet Assistive device: Rolling walker (2 wheeled) Gait Pattern/deviations: Step-through pattern;Decreased step length - right;Decreased step length - left   Gait velocity interpretation: Below normal speed for age/gender General Gait Details: Increased effort with amb with decreased activity tolerance but steady without LOB  Stairs            Wheelchair Mobility    Modified Rankin (Stroke Patients Only)       Balance Overall balance assessment: Needs assistance Sitting-balance support: No upper extremity supported;Feet supported Sitting balance-Leahy Scale: Normal     Standing balance support: Bilateral upper extremity supported;No upper extremity supported;During functional activity Standing balance-Leahy Scale: Good                               Pertinent Vitals/Pain Pain Assessment: 0-10 Pain Score: 4  Pain Location: R side at chest tube placement site  Pain Descriptors / Indicators: Sore;Operative site guarding Pain Intervention(s): Premedicated before session;Monitored during session;Limited activity within patient's tolerance    Home Living Family/patient expects to be discharged to:: Private residence Living Arrangements: Alone   Type of Home: House Home Access: Level entry     Home Layout: One level Home Equipment: None      Prior  Function Level of Independence: Independent         Comments: 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, 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, no fall history, Ind with ADLs     Hand Dominance        Extremity/Trunk Assessment   Upper Extremity Assessment Upper Extremity Assessment: Overall WFL for tasks assessed    Lower Extremity Assessment Lower Extremity Assessment: Generalized weakness       Communication   Communication: No difficulties  Cognition Arousal/Alertness: Awake/alert Behavior During Therapy: WFL for tasks assessed/performed Overall Cognitive Status: Within Functional Limits for tasks assessed                                        General Comments      Exercises Total Joint Exercises Ankle Circles/Pumps: AROM;Both;10 reps Hip ABduction/ADduction: AROM;Both;5 reps Long Arc Quad: AROM;Both;10 reps Knee Flexion: AROM;Both;10 reps Marching in Standing: AROM;Both;10 reps Other Exercises Other Exercises: Dynamic standing balance with gentle reaching outside BOS with good stability grossly   Assessment/Plan    PT Assessment Patient needs continued PT services  PT Problem List Decreased strength;Decreased activity tolerance;Decreased balance       PT Treatment Interventions Gait training;Functional mobility training;Neuromuscular re-education;Balance training;Therapeutic exercise;Therapeutic activities;Patient/family education    PT Goals (Current goals can be found in the Care Plan section)  Acute Rehab PT Goals Patient Stated Goal: Improved endurance PT Goal Formulation: With patient Time For Goal Achievement: 04/13/17 Potential to Achieve Goals: Good    Frequency Min 2X/week   Barriers to discharge        Co-evaluation               AM-PAC PT "6 Clicks" Daily Activity  Outcome Measure Difficulty turning over in bed (including adjusting bedclothes, sheets and blankets)?: None Difficulty moving from  lying on back to sitting on the side of the bed? : None Difficulty sitting down on and standing up from a chair with arms (e.g., wheelchair, bedside commode, etc,.)?: None Help needed moving to and from a bed to chair (including a wheelchair)?: None Help needed walking in hospital room?: A Little Help needed climbing 3-5 steps with a railing? : A Little 6 Click Score: 22    End of Session   Activity Tolerance: Patient limited by fatigue Patient left: in bed;with nursing/sitter in room;with call bell/phone within reach Nurse Communication: Mobility status PT Visit Diagnosis: Muscle weakness (generalized) (M62.81);Difficulty in walking, not elsewhere classified (R26.2)    Time: 5859-2924 PT Time Calculation (min) (ACUTE ONLY): 29 min   Charges:   PT Evaluation $PT Eval Low Complexity: 1 Low PT Treatments $Therapeutic Exercise: 8-22 mins   PT G Codes:        DRoyetta Asal PT, DPT 03/31/17, 3:32 PM

## 2017-03-31 NOTE — Progress Notes (Signed)
  Patient ID: Kristopher [name], male   DOB: [birth year], 57 y.o.   MRN: [id number]  HISTORY: He states that his breathing is better since admission.  Still having some chest wall pain at the chest tube site but mild. He wants to walk in halls and be more active. Appetite is good.   Vitals:   03/31/17 1216 03/31/17 1513  BP: 128/77   Pulse: 89 75  Resp: 18   Temp: 98.4 F (36.9 C)   SpO2: 96% 95%     EXAM:    Resp: Lungs are clear on the left but decreased on the right.  No respiratory distress, normal effort. Heart:  Regular without murmurs Abd:  Abdomen is soft, non distended and non tender. No masses are palpable.  There is no rebound and no guarding.  Neurological: Alert and oriented to person, place, and time. Coordination normal.  Skin: Skin is warm and dry. No rash noted. No diaphoretic. No erythema. No pallor.  Chest tube site is clean and dry  Psychiatric: Normal mood and affect. Normal behavior. Judgment and thought content normal.   Chest tube is draining cloudy pink fluid.  Small air leak present.  ASSESSMENT: I have independently reviewed his CXRay from today.  Large amount of pleural fluid remaining.  CT from yesterday shows thick visceral pleural "lining"   PLAN:   I believe that he will likely need decortication for his empyema.  I have discussed the indications and risks with him as well as the risks of blood transfusion if needed.  I discussed the risks of bleeding, infection, air leak, organ injury and death.  We discussed his postop care including weekly or biweekly clinic visits.  His only transportation is his daughter who is currently incarcerated and will be released at the end of the month.  I discussed this with our social workers who will become involved in his care.  Plan for surgery on Thursday.      Nestor Lewandowsky, MD

## 2017-03-31 NOTE — Progress Notes (Signed)
[place] [facility] [place] NAME: Kristopher [name]    MR#:  [id number]  DATE OF BIRTH:  February 28, 1961  SUBJECTIVE:   Patient symptomatically improved her shortness of breath  REVIEW OF SYSTEMS:    Review of Systems  Constitutional: Negative for fever, chills weight loss Positive for generalized weakness and fatigue HENT: Negative for ear pain, nosebleeds, congestion, facial swelling, rhinorrhea, neck pain, neck stiffness and ear discharge.   Respiratory: Improved shortness of breath and denies cough Cardiovascular: Negative for chest pain, palpitations and leg swelling.  Gastrointestinal: Negative for heartburn, abdominal pain, vomiting, diarrhea or consitpation Genitourinary: Negative for dysuria, urgency, frequency, hematuria Musculoskeletal: Negative for back pain or joint pain Neurological: Negative for dizziness, seizures, syncope, focal weakness,  numbness and headaches.  Hematological: Does not bruise/bleed easily.  Psychiatric/Behavioral: Negative for hallucinations, confusion, dysphoric mood    Tolerating Diet: yes      DRUG ALLERGIES:   Allergies  Allergen Reactions  . Percocet [Oxycodone-Acetaminophen] Itching    VITALS:  Blood pressure 123/79, pulse 96, temperature 97.9 F (36.6 C), temperature source Oral, resp. rate 20, height 5\' 11"  (1.803 m), weight 58.4 kg (128 lb 12 oz), SpO2 96 %.  PHYSICAL EXAMINATION:  Constitutional: Appears thin. No distress. HENT: Normocephalic. Marland Kitchen Oropharynx is clear and moist.  Eyes: Conjunctivae and EOM are normal. PERRLA, no scleral icterus.  Neck: Normal ROM. Neck supple. No JVD. No tracheal deviation. CVS: RRR, S1/S2 +, no murmurs, no gallops, no carotid bruit.  Pulmonary: Chest tube placed with decreased breath sounds right upper lobe . Abdominal: Soft. BS +,  no distension, tenderness, rebound or guarding.  Musculoskeletal: Normal range of motion. No edema and no tenderness.  Neuro: Alert.  CN 2-12 grossly intact. No focal deficits. Skin: Skin is warm and dry. No rash noted. Psychiatric: Normal mood and affect.      LABORATORY PANEL:   CBC Recent Labs  Lab 03/31/17 0446  WBC 11.2*  HGB 11.2*  HCT 32.4*  PLT 561*   ------------------------------------------------------------------------------------------------------------------  Chemistries  Recent Labs  Lab 03/28/17 1839  03/30/17 0449 03/31/17 0446  NA 127*   < > 127* 130*  K 4.4   < > 4.1 4.2  CL 88*   < > 91* 93*  CO2 26   < > 29 29  GLUCOSE 116*   < > 95 100*  BUN 6   < > 6 7  CREATININE 0.45*   < > 0.48* 0.46*  CALCIUM 8.7*   < > 8.4* 8.2*  MG  --   --  1.8  --   AST 36  --   --   --   ALT 25  --   --   --   ALKPHOS 107  --   --   --   BILITOT 0.9  --   --   --    < > = values in this interval not displayed.   ------------------------------------------------------------------------------------------------------------------  Cardiac Enzymes Recent Labs  Lab 03/28/17 1839  TROPONINI <0.03   ------------------------------------------------------------------------------------------------------------------  RADIOLOGY:  Ct Chest Wo Contrast  Result Date: 03/30/2017 CLINICAL DATA:  57 year old male with history of empyema. Followup study. EXAM: CT CHEST WITHOUT CONTRAST TECHNIQUE: Multidetector CT imaging of the chest was performed following the standard protocol without IV contrast. COMPARISON:  Chest CT 03/28/2017. FINDINGS: Cardiovascular: Heart size is normal. There is no significant pericardial fluid, thickening or pericardial calcification. There is aortic atherosclerosis, as well as atherosclerosis of the great  vessels of the mediastinum and the coronary arteries, including calcified atherosclerotic plaque in the left anterior descending, left circumflex and right coronary arteries. Mediastinum/Nodes: No pathologically enlarged mediastinal or hilar lymph nodes. Please note that accurate  exclusion of hilar adenopathy is limited on noncontrast CT scans. Esophagus is unremarkable in appearance. No axillary lymphadenopathy. Lungs/Pleura: There has been interval placement of a right-sided chest tube with tip near the apex of the right hemithorax. Previously noted large right empyema has significantly decreased in size. There continues to be multiple internal loculations within this complex gas and fluid collection in the right hemithorax. Some re-expansion of the right lung is evident, although there continues to be considerable atelectasis in portions of the right middle and lower lobe, as well as extensive residual airspace consolidation in the right lower lobe. Left lung remains clear. No left pleural effusion. Upper Abdomen: Aortic atherosclerosis. Musculoskeletal: There are no aggressive appearing lytic or blastic lesions noted in the visualized portions of the skeleton. IMPRESSION: 1. Decreasing size of what remains a large right empyema following chest tube placement, with some re-expansion of the right lung. Considerable residual atelectasis and airspace consolidation is noted, as discussed above. 2. Aortic atherosclerosis, in addition to 3 vessel coronary artery disease. Please note that although the presence of coronary artery calcium documents the presence of coronary artery disease, the severity of this disease and any potential stenosis cannot be assessed on this non-gated CT examination. Assessment for potential risk factor modification, dietary therapy or pharmacologic therapy may be warranted, if clinically indicated. Aortic Atherosclerosis (ICD10-I70.0). Electronically Signed   By: Vinnie Langton M.D.   On: 03/30/2017 10:12   Dg Chest Port 1 View  Result Date: 03/31/2017 CLINICAL DATA:  57 year old male with history of pneumonia and empyema. Follow-up study. EXAM: PORTABLE CHEST 1 VIEW COMPARISON:  Chest x-ray 03/30/2017. FINDINGS: Previously noted right-sided chest tube remains  stable in position with tip near the apex of the right hemithorax. Complex right pleural fluid and gas collection (empyema) appears slightly decreased in size compared to the prior study. Continued improved aeration throughout the right lung compatible with resolving areas of atelectasis and consolidation. Left lung remains clear. No left pleural effusion. No evidence of pulmonary edema. Heart size is normal. Upper mediastinal contours are within normal limits. IMPRESSION: 1. Decreasing size of right-sided empyema with continued re-expansion of the right lung and resolving areas of atelectasis and consolidation, as above. Electronically Signed   By: Vinnie Langton M.D.   On: 03/31/2017 07:22   Dg Chest Port 1 View  Result Date: 03/30/2017 CLINICAL DATA:  57 year old male under evaluation for chest tube placement. EXAM: PORTABLE CHEST 1 VIEW COMPARISON:  Chest x-ray 03/29/2017. FINDINGS: Previously noted right-sided chest tube remains in a similar position with tip near the apex of the right hemithorax. Previously noted right-sided hydropneumothorax has decreased in size, now with only a trace residual pneumothorax component and decreasing moderate-sized fluid component which appears partially loculated in the lower lateral right hemithorax. Areas of atelectasis and/or consolidation throughout the right mid to lower lung persist, although aeration appears slightly improved. Left lung is well aerated. No left pleural effusion. No evidence of pulmonary edema. Heart size is normal. Upper mediastinal contours are within normal limits. IMPRESSION: 1. Stable position of right-sided chest tube with slight decreased size of right-sided empyema. Electronically Signed   By: Vinnie Langton M.D.   On: 03/30/2017 07:30   Dg Chest Port 1 View  Result Date: 03/29/2017 CLINICAL DATA:  Chest tube  placement EXAM: PORTABLE CHEST 1 VIEW COMPARISON:  03/28/2017 FINDINGS: Interval placement of large bore chest tube on the  right in good position. Significant improvement in right effusion. There remains moderate amount of loculated pleural fluid in the lateral base. 15 mm pneumothorax in the lateral apex. Partial re-expansion of the right upper lobe and a portion of the right lower lobe. Underlying pneumonia is suspected. Tumor not excluded. Left lung remains clear.  No heart failure.  Heart size normal. IMPRESSION: Interval placement of right chest tube with marked improvement in large right effusion. There remains a moderate loculated effusion in the right lateral base. 15 mm right lateral apex pneumothorax Re-expansion of right upper lobe. Partial re-expansion right lower lobe with underlying infiltrate present based on prior studies. Electronically Signed   By: Franchot Gallo M.D.   On: 03/29/2017 13:39     ASSESSMENT AND PLAN:   57 year old male with EtOH abuse who presents with shortness of breath and found to have large empyema and CT scan  1. Tension Empyema/right lateral apex pneumothorax with reexpansion of right upper lobe: Patient is status post chest tube placement. He might still require VATS with decortication to allow for reexpansion of RLL and complete cleanout of pleural space Consultation with Dr.Oaks has been placed. Continue Follow-up on final cultures from empyema   2. Hyponatremia: This is due to empyema and dehydration Follow sodium levels  3. EtOH abuse: Uneventful detox so far  Continue CIWA protocol  4. Severe protein calorie malnutrition: Dietary consultation requested and appreciated. Continue dietary supplement  5. One out of 2 gram-positive cocci staph species which is contaminant  D/w dr simonds Management plans discussed with the patient and he is in agreement.  CODE STATUS: FULL  TOTAL TIME TAKING CARE OF THIS PATIENT: 25 minutes.     POSSIBLE D/C 3-5 days, DEPENDING ON CLINICAL CONDITION.   Aleem Elza M.D on 03/31/2017 at 9:09 AM  Between 7am to 6pm - Pager -  [phone] After 6pm go to [email or web address] - password [email or web address] [facility]  Office  [phone]  CC: Primary care physician; Patient, No Pcp Per  Note: This dictation was prepared with Dragon dictation along with smaller phrase technology. Any transcriptional errors that result from this process are unintentional.

## 2017-03-31 NOTE — Progress Notes (Signed)
* [facility]     Assessment and Plan:  Necrotizing pneumonia complicated by severe right lung empyema, status post tube thoracostomy. History of alcohol abuse. Nicotine abuse with COPD.   --Continue empyema management per thoracic surgery --Continue abx.  --Outpatient eval for COPD once recovered.  --Discussed the importance of smoking cessation.    Date: 03/31/2017  MRN# [id number] Kristopher [name] 07-28-1960   Kristopher [name] is a 57 y.o. old male seen in follow up for chief complaint of  Chief Complaint  Patient presents with  . Shortness of Breath     HPI:  Pt is awake, alert, no new complaints.    Medication:    Current Facility-Administered Medications:  .  0.9 %  sodium chloride infusion, , Intravenous, Continuous, Mody, Sital, MD, Last Rate: 75 mL/hr at 03/31/17 0530 .  acetaminophen (TYLENOL) tablet 650 mg, 650 mg, Oral, Q6H PRN **OR** acetaminophen (TYLENOL) suppository 650 mg, 650 mg, Rectal, Q6H PRN, Dustin Flock, MD .  enoxaparin (LOVENOX) injection 40 mg, 40 mg, Subcutaneous, Q24H, Dustin Flock, MD, 40 mg at 03/30/17 2045 .  feeding supplement (ENSURE ENLIVE) (ENSURE ENLIVE) liquid 237 mL, 237 mL, Oral, TID BM, Mody, Sital, MD, 237 mL at 03/31/17 0841 .  folic acid (FOLVITE) tablet 1 mg, 1 mg, Oral, Daily, Dustin Flock, MD, 1 mg at 03/31/17 0841 .  ibuprofen (ADVIL,MOTRIN) tablet 400 mg, 400 mg, Oral, Q6H PRN, Wilhelmina Mcardle, MD, 400 mg at 03/31/17 0841 .  ketorolac (TORADOL) 15 MG/ML injection 15 mg, 15 mg, Intravenous, Q6H PRN, Tukov, Magadalene S, NP, 15 mg at 03/31/17 1034 .  LORazepam (ATIVAN) tablet 1 mg, 1 mg, Oral, Q6H PRN, Wilhelmina Mcardle, MD, 1 mg at 03/30/17 2046 .  multivitamin with minerals tablet 1 tablet, 1 tablet, Oral, Daily, Dustin Flock, MD, 1 tablet at 03/31/17 0841 .  [DISCONTINUED] ondansetron (ZOFRAN) tablet 4 mg, 4 mg, Oral, Q6H PRN **OR** ondansetron (ZOFRAN) injection 4 mg, 4 mg, Intravenous, Q6H PRN, Dustin Flock, MD .  oxyCODONE-acetaminophen (PERCOCET) 7.5-325 MG per tablet 1-2 tablet, 1-2 tablet, Oral, Q4H PRN, Tukov, Magadalene S, NP .  piperacillin-tazobactam (ZOSYN) IVPB 3.375 g, 3.375 g, Intravenous, Q8H, Dustin Flock, MD, Stopped at 03/31/17 [phone] .  thiamine (VITAMIN B-1) tablet 100 mg, 100 mg, Oral, Daily, 100 mg at 03/31/17 0841 **OR** [DISCONTINUED] thiamine (B-1) injection 100 mg, 100 mg, Intravenous, Daily, Dustin Flock, MD   Allergies:  Percocet [oxycodone-acetaminophen]  Review of Systems: Gen:  Denies  fever, sweats. HEENT: Denies blurred vision. Cvc:  No dizziness, chest pain or heaviness Resp:   Denies cough or sputum porduction. Gi: Denies swallowing difficulty, stomach pain. constipation, bowel incontinence Gu:  Denies bladder incontinence, burning urine Ext:   No Joint pain, stiffness. Skin: No skin rash, easy bruising. Endoc:  No polyuria, polydipsia. Psych: No depression, insomnia. Other:  All other systems were reviewed and found to be negative other than what is mentioned in the HPI.   Physical Examination:   VS: BP 123/79 (BP Location: Left Arm)   Pulse 96   Temp 97.9 F (36.6 C) (Oral)   Resp 20   Ht 5\' 11"  (1.803 m)   Wt 128 lb 12 oz (58.4 kg)   SpO2 96%   BMI 17.96 kg/m   General Appearance: No distress  Neuro:without focal findings,  speech normal,  HEENT: PERRLA, EOM intact. Pulmonary: decreased air entry in right lung.  CardiovascularNormal S1,S2.  No m/r/g.   Abdomen: Benign, Soft, non-tender.  Renal:  No costovertebral tenderness  GU:  Not performed at this time. Endoc: No evident thyromegaly, no signs of acromegaly. Skin:   warm, no rash. Extremities: normal, no cyanosis, clubbing.   LABORATORY PANEL:   CBC Recent Labs  Lab 03/31/17 0446  WBC 11.2*  HGB 11.2*  HCT 32.4*  PLT 561*   ------------------------------------------------------------------------------------------------------------------  Chemistries  Recent  Labs  Lab 03/28/17 1839  03/30/17 0449 03/31/17 0446  NA 127*   < > 127* 130*  K 4.4   < > 4.1 4.2  CL 88*   < > 91* 93*  CO2 26   < > 29 29  GLUCOSE 116*   < > 95 100*  BUN 6   < > 6 7  CREATININE 0.45*   < > 0.48* 0.46*  CALCIUM 8.7*   < > 8.4* 8.2*  MG  --   --  1.8  --   AST 36  --   --   --   ALT 25  --   --   --   ALKPHOS 107  --   --   --   BILITOT 0.9  --   --   --    < > = values in this interval not displayed.   ------------------------------------------------------------------------------------------------------------------  Cardiac Enzymes Recent Labs  Lab 03/28/17 1839  TROPONINI <0.03   ------------------------------------------------------------  RADIOLOGY:   No results found for this or any previous visit. Results for orders placed during the hospital encounter of 03/28/17  DG Chest 2 View   Narrative CLINICAL DATA:  Patient reports SOB onset today. No known heart or lung conditions. Current smoker.  EXAM: CHEST  2 VIEW  COMPARISON:  Chest CT 03/02/2017, chest radiograph 03/04/2017  FINDINGS: Normal cardiac silhouette. There is continued increase in fluid within the RIGHT hemithorax. The RIGHT hemithorax is near completely opacified (95%). This is increased significantly from comparison chest radiograph. There is mild mass effect upon the mediastinum.  IMPRESSION: 1. Increasing pleural fluid in the RIGHT hemithorax with near complete opacification of the RIGHT hemithorax. This is increased significantly from 03/04/2017 where patient was found to have pneumonia on chest CT. 2. Mild shift of the mediastinum by the increasing pleural fluid. 3. Consider thoracentesis with cytology to evaluate for pulmonary malignancy.   Electronically Signed   By: Suzy Bouchard M.D.   On: 03/28/2017 19:11    ------------------------------------------------------------------------------------------------------------------  Thank  you for allowing [facility] to assist in the care of your patient. Our recommendations are noted above.  Please contact us if we can be of further service.   Marda Stalker, MD.  [facility] Office Number: [phone]  Patricia Pesa, M.D.  Merton Border, M.D  03/31/2017

## 2017-04-01 ENCOUNTER — Inpatient Hospital Stay: Payer: 59

## 2017-04-01 LAB — CULTURE, BLOOD (ROUTINE X 2): SPECIAL REQUESTS: ADEQUATE

## 2017-04-01 LAB — CBC
HEMATOCRIT: 31.8 % — AB (ref 40.0–52.0)
Hemoglobin: 10.7 g/dL — ABNORMAL LOW (ref 13.0–18.0)
MCH: 30.7 pg (ref 26.0–34.0)
MCHC: 33.8 g/dL (ref 32.0–36.0)
MCV: 91 fL (ref 80.0–100.0)
PLATELETS: 632 10*3/uL — AB (ref 150–440)
RBC: 3.5 MIL/uL — ABNORMAL LOW (ref 4.40–5.90)
RDW: 14.1 % (ref 11.5–14.5)
WBC: 8.7 10*3/uL (ref 3.8–10.6)

## 2017-04-01 LAB — BASIC METABOLIC PANEL
Anion gap: 8 (ref 5–15)
BUN: 6 mg/dL (ref 6–20)
CHLORIDE: 94 mmol/L — AB (ref 101–111)
CO2: 29 mmol/L (ref 22–32)
CREATININE: 0.42 mg/dL — AB (ref 0.61–1.24)
Calcium: 8.4 mg/dL — ABNORMAL LOW (ref 8.9–10.3)
GFR calc Af Amer: >60 mL/min (ref >60)
GFR calc non Af Amer: >60 mL/min (ref >60)
GLUCOSE: 92 mg/dL (ref 65–99)
Potassium: 4.5 mmol/L (ref 3.5–5.1)
Sodium: 131 mmol/L — ABNORMAL LOW (ref 135–145)

## 2017-04-01 LAB — CYTOLOGY - NON PAP

## 2017-04-01 MED ORDER — KETOROLAC TROMETHAMINE 30 MG/ML IJ SOLN
30.0000 mg | Freq: Four times a day (QID) | INTRAMUSCULAR | Status: DC | PRN
  Administered 2017-04-02: 30 mg via INTRAVENOUS
  Filled 2017-04-01: qty 1

## 2017-04-01 MED ORDER — MORPHINE SULFATE (PF) 2 MG/ML IV SOLN: 2.0000 mg | INTRAVENOUS | Status: DC | PRN

## 2017-04-01 NOTE — Progress Notes (Signed)
* [facility]     Assessment and Plan:  Necrotizing pneumonia complicated by severe right lung empyema, status post tube thoracostomy, minimal/slow improvement.  History of alcohol abuse. Nicotine abuse with COPD.   --Continue empyema management per thoracic surgery --Continue abx.  --Outpatient eval for COPD once recovered.  --Discussed the importance of smoking cessation.    Date: 04/01/2017  MRN# [id number] Kristopher [name] 04/26/1960   Kristopher [name] is a 57 y.o. old male seen in follow up for chief complaint of  Chief Complaint  Patient presents with  . Shortness of Breath     HPI:  Pt is awake, alert, no new complaints. He feels a bit better today.   Pleural fluid culture negative thus far.   Medication:    Current Facility-Administered Medications:  .  0.9 %  sodium chloride infusion, , Intravenous, Continuous, Mody, Sital, MD, Last Rate: 75 mL/hr at 04/01/17 0601 .  acetaminophen (TYLENOL) tablet 650 mg, 650 mg, Oral, Q6H PRN **OR** acetaminophen (TYLENOL) suppository 650 mg, 650 mg, Rectal, Q6H PRN, Dustin Flock, MD .  feeding supplement (ENSURE ENLIVE) (ENSURE ENLIVE) liquid 237 mL, 237 mL, Oral, TID BM, Mody, Sital, MD, 237 mL at 04/01/17 0914 .  folic acid (FOLVITE) tablet 1 mg, 1 mg, Oral, Daily, Dustin Flock, MD, 1 mg at 04/01/17 0914 .  heparin injection 5,000 Units, 5,000 Units, Subcutaneous, Q8H, Nestor Lewandowsky, MD, 5,000 Units at 04/01/17 0559 .  ketorolac (TORADOL) 30 MG/ML injection 30 mg, 30 mg, Intravenous, Q6H PRN, Mody, Sital, MD .  LORazepam (ATIVAN) tablet 1 mg, 1 mg, Oral, Q6H PRN, Wilhelmina Mcardle, MD, 1 mg at 03/31/17 2345 .  morphine 2 MG/ML injection 2 mg, 2 mg, Intravenous, Q4H PRN, Mody, Sital, MD .  multivitamin with minerals tablet 1 tablet, 1 tablet, Oral, Daily, Dustin Flock, MD, 1 tablet at 04/01/17 0914 .  [DISCONTINUED] ondansetron (ZOFRAN) tablet 4 mg, 4 mg, Oral, Q6H PRN **OR** ondansetron (ZOFRAN) injection 4  mg, 4 mg, Intravenous, Q6H PRN, Dustin Flock, MD .  oxyCODONE-acetaminophen (PERCOCET) 7.5-325 MG per tablet 1-2 tablet, 1-2 tablet, Oral, Q4H PRN, Mikael Spray, NP, 2 tablet at 04/01/17 0915 .  piperacillin-tazobactam (ZOSYN) IVPB 3.375 g, 3.375 g, Intravenous, Q8H, Dustin Flock, MD, Stopped at 04/01/17 1028 .  senna-docusate (Senokot-S) tablet 1 tablet, 1 tablet, Oral, QHS, Mody, Sital, MD, 1 tablet at 03/31/17 2114 .  thiamine (VITAMIN B-1) tablet 100 mg, 100 mg, Oral, Daily, 100 mg at 04/01/17 0914 **OR** [DISCONTINUED] thiamine (B-1) injection 100 mg, 100 mg, Intravenous, Daily, Dustin Flock, MD   Allergies:  Percocet [oxycodone-acetaminophen]  Review of Systems: Gen:  Denies  fever, sweats. HEENT: Denies blurred vision. Cvc:  No dizziness, chest pain or heaviness Resp:   Denies cough or sputum porduction. Gi: Denies swallowing difficulty, stomach pain. constipation, bowel incontinence Gu:  Denies bladder incontinence, burning urine Ext:   No Joint pain, stiffness. Skin: No skin rash, easy bruising. Endoc:  No polyuria, polydipsia. Psych: No depression, insomnia. Other:  All other systems were reviewed and found to be negative other than what is mentioned in the HPI.   Physical Examination:   VS: BP 130/87 (BP Location: Left Arm)   Pulse 81   Temp 98.7 F (37.1 C) (Oral)   Resp 20   Ht 5\' 11"  (1.803 m)   Wt 128 lb 12 oz (58.4 kg)   SpO2 97%   BMI 17.96 kg/m   General Appearance: No distress  Neuro:without focal  findings,  speech normal,  HEENT: PERRLA, EOM intact. Pulmonary: continued decreased air entry in right lung.  CardiovascularNormal S1,S2.  No m/r/g.   Abdomen: Benign, Soft, non-tender. Renal:  No costovertebral tenderness  GU:  Not performed at this time. Endoc: No evident thyromegaly, no signs of acromegaly. Skin:   warm, no rash. Extremities: normal, no cyanosis, clubbing.   LABORATORY PANEL:   CBC Recent Labs  Lab 04/01/17 0352    WBC 8.7  HGB 10.7*  HCT 31.8*  PLT 632*   ------------------------------------------------------------------------------------------------------------------  Chemistries  Recent Labs  Lab 03/28/17 1839  03/30/17 0449  04/01/17 0352  NA 127*   < > 127*   < > 131*  K 4.4   < > 4.1   < > 4.5  CL 88*   < > 91*   < > 94*  CO2 26   < > 29   < > 29  GLUCOSE 116*   < > 95   < > 92  BUN 6   < > 6   < > 6  CREATININE 0.45*   < > 0.48*   < > 0.42*  CALCIUM 8.7*   < > 8.4*   < > 8.4*  MG  --   --  1.8  --   --   AST 36  --   --   --   --   ALT 25  --   --   --   --   ALKPHOS 107  --   --   --   --   BILITOT 0.9  --   --   --   --    < > = values in this interval not displayed.   ------------------------------------------------------------------------------------------------------------------  Cardiac Enzymes Recent Labs  Lab 03/28/17 1839  TROPONINI <0.03   ------------------------------------------------------------  RADIOLOGY:   No results found for this or any previous visit. Results for orders placed during the hospital encounter of 03/28/17  DG Chest 2 View   Narrative CLINICAL DATA:  Patient reports SOB onset today. No known heart or lung conditions. Current smoker.  EXAM: CHEST  2 VIEW  COMPARISON:  Chest CT 03/02/2017, chest radiograph 03/04/2017  FINDINGS: Normal cardiac silhouette. There is continued increase in fluid within the RIGHT hemithorax. The RIGHT hemithorax is near completely opacified (95%). This is increased significantly from comparison chest radiograph. There is mild mass effect upon the mediastinum.  IMPRESSION: 1. Increasing pleural fluid in the RIGHT hemithorax with near complete opacification of the RIGHT hemithorax. This is increased significantly from 03/04/2017 where patient was found to have pneumonia on chest CT. 2. Mild shift of the mediastinum by the increasing pleural fluid. 3. Consider thoracentesis with cytology to evaluate  for pulmonary malignancy.   Electronically Signed   By: Suzy Bouchard M.D.   On: 03/28/2017 19:11    ------------------------------------------------------------------------------------------------------------------  Thank  you for allowing [facility] to assist in the care of your patient. Our recommendations are noted above.  Please contact us if we can be of further service.   Marda Stalker, MD.  [facility] Office Number: [phone]  Patricia Pesa, M.D.  Merton Border, M.D  04/01/2017

## 2017-04-01 NOTE — Progress Notes (Signed)
  Patient ID: Kristopher [name], male   DOB: January 11, 1961, 57 y.o.   MRN: [id number]  HISTORY: He was able to get out of bed and walk yesterday.  He states that he felt much better having done so.  His pain is less today than it was before.  He denies any shortness of breath.   Vitals:   04/01/17 0410 04/01/17 1328  BP: 130/87 114/77  Pulse: 81 81  Resp: 20   Temp: 98.7 F (37.1 C) 97.6 F (36.4 C)  SpO2: 97% 96%     EXAM:    Resp: Lungs are clear on the left but diminished at the right base..  No respiratory distress, normal effort. Heart:  Regular without murmurs Abd:  Abdomen is soft, non distended and non tender. No masses are palpable.  There is no rebound and no guarding.  Neurological: Alert and oriented to person, place, and time. Coordination normal.  Skin: Skin is warm and dry. No rash noted. No diaphoretic. No erythema. No pallor.  Psychiatric: Normal mood and affect. Normal behavior. Judgment and thought content normal.   There is an intermittent small air leak   ASSESSMENT: Right empyema with probable lung abscess ruptured in the pleural space   PLAN:   I did explain to the patient today that we will follow the chest x-ray tomorrow.  If there is still a significant pleural effusion the my recommendation is for a right thoracotomy and decortication.  He understands and would like to proceed.    Nestor Lewandowsky, MD

## 2017-04-01 NOTE — Progress Notes (Signed)
[facility] NAME: Josafat Enrico    MR#:  [id number]  DATE OF BIRTH:  1961/03/07  SUBJECTIVE:   Pain not adequately controlled when he moves SOB better  REVIEW OF SYSTEMS:    Review of Systems  Constitutional: Negative for fever, chills weight loss Positive for generalized weakness and fatigue HENT: Negative for ear pain, nosebleeds, congestion, facial swelling, rhinorrhea, neck pain, neck stiffness and ear discharge.   Respiratory: Improved shortness of breath denies cough Cardiovascular: Negative for chest pain, palpitations and leg swelling.  Gastrointestinal: Negative for heartburn, abdominal pain, vomiting, diarrhea or consitpation Genitourinary: Negative for dysuria, urgency, frequency, hematuria Musculoskeletal: Negative for back pain or joint pain Neurological: Negative for dizziness, seizures, syncope, focal weakness,  numbness and headaches.  Hematological: Does not bruise/bleed easily.  Psychiatric/Behavioral: Negative for hallucinations, confusion, dysphoric mood    Tolerating Diet: yes      DRUG ALLERGIES:   Allergies  Allergen Reactions  . Percocet [Oxycodone-Acetaminophen] Itching    VITALS:  Blood pressure 130/87, pulse 81, temperature 98.7 F (37.1 C), temperature source Oral, resp. rate 20, height 5\' 11"  (1.803 m), weight 58.4 kg (128 lb 12 oz), SpO2 97 %.  PHYSICAL EXAMINATION:  Constitutional: Appears thin. No distress. HENT: Normocephalic. Marland Kitchen Oropharynx is clear and moist.  Eyes: Conjunctivae and EOM are normal. PERRLA, no scleral icterus.  Neck: Normal ROM. Neck supple. No JVD. No tracheal deviation. CVS: RRR, S1/S2 +, no murmurs, no gallops, no carotid bruit.  Pulmonary: Chest tube placed with decreased breath sounds right upper lobe . Abdominal: Soft. BS +,  no distension, tenderness, rebound or guarding.  Musculoskeletal: Normal range of motion. No edema and no tenderness.  Neuro: Alert. CN  2-12 grossly intact. No focal deficits. Skin: Skin is warm and dry. No rash noted. Psychiatric: Normal mood and affect.      LABORATORY PANEL:   CBC Recent Labs  Lab 04/01/17 0352  WBC 8.7  HGB 10.7*  HCT 31.8*  PLT 632*   ------------------------------------------------------------------------------------------------------------------  Chemistries  Recent Labs  Lab 03/28/17 1839  03/30/17 0449  04/01/17 0352  NA 127*   < > 127*   < > 131*  K 4.4   < > 4.1   < > 4.5  CL 88*   < > 91*   < > 94*  CO2 26   < > 29   < > 29  GLUCOSE 116*   < > 95   < > 92  BUN 6   < > 6   < > 6  CREATININE 0.45*   < > 0.48*   < > 0.42*  CALCIUM 8.7*   < > 8.4*   < > 8.4*  MG  --   --  1.8  --   --   AST 36  --   --   --   --   ALT 25  --   --   --   --   ALKPHOS 107  --   --   --   --   BILITOT 0.9  --   --   --   --    < > = values in this interval not displayed.   ------------------------------------------------------------------------------------------------------------------  Cardiac Enzymes Recent Labs  Lab 03/28/17 1839  TROPONINI <0.03   ------------------------------------------------------------------------------------------------------------------  RADIOLOGY:  Ct Chest Wo Contrast  Result Date: 03/30/2017 CLINICAL DATA:  57 year old male with history of empyema. Followup study. EXAM: CT CHEST WITHOUT  CONTRAST TECHNIQUE: Multidetector CT imaging of the chest was performed following the standard protocol without IV contrast. COMPARISON:  Chest CT 03/28/2017. FINDINGS: Cardiovascular: Heart size is normal. There is no significant pericardial fluid, thickening or pericardial calcification. There is aortic atherosclerosis, as well as atherosclerosis of the great vessels of the mediastinum and the coronary arteries, including calcified atherosclerotic plaque in the left anterior descending, left circumflex and right coronary arteries. Mediastinum/Nodes: No pathologically enlarged  mediastinal or hilar lymph nodes. Please note that accurate exclusion of hilar adenopathy is limited on noncontrast CT scans. Esophagus is unremarkable in appearance. No axillary lymphadenopathy. Lungs/Pleura: There has been interval placement of a right-sided chest tube with tip near the apex of the right hemithorax. Previously noted large right empyema has significantly decreased in size. There continues to be multiple internal loculations within this complex gas and fluid collection in the right hemithorax. Some re-expansion of the right lung is evident, although there continues to be considerable atelectasis in portions of the right middle and lower lobe, as well as extensive residual airspace consolidation in the right lower lobe. Left lung remains clear. No left pleural effusion. Upper Abdomen: Aortic atherosclerosis. Musculoskeletal: There are no aggressive appearing lytic or blastic lesions noted in the visualized portions of the skeleton. IMPRESSION: 1. Decreasing size of what remains a large right empyema following chest tube placement, with some re-expansion of the right lung. Considerable residual atelectasis and airspace consolidation is noted, as discussed above. 2. Aortic atherosclerosis, in addition to 3 vessel coronary artery disease. Please note that although the presence of coronary artery calcium documents the presence of coronary artery disease, the severity of this disease and any potential stenosis cannot be assessed on this non-gated CT examination. Assessment for potential risk factor modification, dietary therapy or pharmacologic therapy may be warranted, if clinically indicated. Aortic Atherosclerosis (ICD10-I70.0). Electronically Signed   By: Vinnie Langton M.D.   On: 03/30/2017 10:12   Dg Chest Port 1 View  Result Date: 04/01/2017 CLINICAL DATA:  Follow-up right chest tube positioning EXAM: PORTABLE CHEST 1 VIEW COMPARISON:  Chest x-ray of March 31, 2017 FINDINGS: The right chest  tube tip projects over the posteromedial aspect of the fourth rib. There remains a small to moderate-sized right pleural effusion. There is no pneumothorax. There is right basilar parenchymal density. The mediastinum is not shifted. The left lung is well-expanded and clear. There is stable left apical pleural thickening. The heart and pulmonary vascularity are normal. The mediastinum is normal in width. The observed bony thorax is unremarkable. IMPRESSION: Stable appearance of the moderate-sized right pleural effusion/empyema. Persistent right basilar atelectasis or pneumonia. No pneumothorax. Stable positioning of the right-sided chest tube. Electronically Signed   By: David  Martinique M.D.   On: 04/01/2017 07:42   Dg Chest Port 1 View  Result Date: 03/31/2017 CLINICAL DATA:  57 year old male with history of pneumonia and empyema. Follow-up study. EXAM: PORTABLE CHEST 1 VIEW COMPARISON:  Chest x-ray 03/30/2017. FINDINGS: Previously noted right-sided chest tube remains stable in position with tip near the apex of the right hemithorax. Complex right pleural fluid and gas collection (empyema) appears slightly decreased in size compared to the prior study. Continued improved aeration throughout the right lung compatible with resolving areas of atelectasis and consolidation. Left lung remains clear. No left pleural effusion. No evidence of pulmonary edema. Heart size is normal. Upper mediastinal contours are within normal limits. IMPRESSION: 1. Decreasing size of right-sided empyema with continued re-expansion of the right lung and resolving areas  of atelectasis and consolidation, as above. Electronically Signed   By: Vinnie Langton M.D.   On: 03/31/2017 07:22     ASSESSMENT AND PLAN:   57 year old male with EtOH abuse who presents with shortness of breath and found to have large empyema and CT scan  1. Tension Empyema/right lateral apex pneumothorax with reexpansion of right upper lobe: Patient is status  post chest tube placement. He will require decortication for empyema which is planned for Thursday.  Appreciate Dr. Genevive Bi consult.  Initial cultures thus far are negative. Follow-up on final cultures from empyema I will add Toradol for pain  2. Hyponatremia: This is due to empyema and dehydration Follow sodium levels  3. EtOH abuse: Uneventful detox so far  Continue CIWA protocol  4. Severe protein calorie malnutrition: Dietary consultation requested and appreciated. Continue dietary supplement  5. One out of 2 gram-positive cocci staph species which is contaminant  D/w dr Genevive Bi   Management plans discussed with the patient and he is in agreement.  CODE STATUS: FULL  TOTAL TIME TAKING CARE OF THIS PATIENT: 25 minutes.   PT recommends home with home health upon discharge.  POSSIBLE D/C 3-5 days, DEPENDING ON CLINICAL CONDITION.   Shloimy Michalski M.D on 04/01/2017 at 9:17 AM  Between 7am to 6pm - Pager - [phone] After 6pm go to [email or web address] - password [email or web address] [facility]  Office  [phone]  CC: Primary care physician; Patient, No Pcp Per  Note: This dictation was prepared with Dragon dictation along with smaller phrase technology. Any transcriptional errors that result from this process are unintentional.

## 2017-04-02 ENCOUNTER — Inpatient Hospital Stay: Payer: 59

## 2017-04-02 DIAGNOSIS — J869 Pyothorax without fistula: Secondary | ICD-10-CM

## 2017-04-02 LAB — CULTURE, BLOOD (ROUTINE X 2)
CULTURE: NO GROWTH
SPECIAL REQUESTS: ADEQUATE

## 2017-04-02 LAB — PREPARE RBC (CROSSMATCH)

## 2017-04-02 LAB — BASIC METABOLIC PANEL
Anion gap: 7 (ref 5–15)
BUN: 7 mg/dL (ref 6–20)
CALCIUM: 8.8 mg/dL — AB (ref 8.9–10.3)
CO2: 30 mmol/L (ref 22–32)
Chloride: 93 mmol/L — ABNORMAL LOW (ref 101–111)
Creatinine, Ser: 0.6 mg/dL — ABNORMAL LOW (ref 0.61–1.24)
GFR calc Af Amer: >60 mL/min (ref >60)
Glucose, Bld: 95 mg/dL (ref 65–99)
POTASSIUM: 4.4 mmol/L (ref 3.5–5.1)
SODIUM: 130 mmol/L — AB (ref 135–145)

## 2017-04-02 LAB — CBC
HCT: 31.5 % — ABNORMAL LOW (ref 40.0–52.0)
Hemoglobin: 10.6 g/dL — ABNORMAL LOW (ref 13.0–18.0)
MCH: 30.6 pg (ref 26.0–34.0)
MCHC: 33.6 g/dL (ref 32.0–36.0)
MCV: 91.1 fL (ref 80.0–100.0)
PLATELETS: 631 10*3/uL — AB (ref 150–440)
RBC: 3.46 MIL/uL — AB (ref 4.40–5.90)
RDW: 14.2 % (ref 11.5–14.5)
WBC: 9.2 10*3/uL (ref 3.8–10.6)

## 2017-04-02 LAB — BODY FLUID CULTURE: Culture: NO GROWTH

## 2017-04-02 LAB — MRSA PCR SCREENING: MRSA BY PCR: NEGATIVE

## 2017-04-02 NOTE — Progress Notes (Signed)
  Patient ID: Kristopher [name], male   DOB: [birth year], 57 y.o.   MRN: [id number]  HISTORY: Overall much improved.  Good apetitie.  Pain under good control.  Not short of breath.   Vitals:   04/02/17 0509 04/02/17 1150  BP: 128/83 109/84  Pulse: 88 (!) 113  Resp: 20 17  Temp: 98.6 F (37 C) 98.1 F (36.7 C)  SpO2: 98% 98%     EXAM:    Resp: Lungs are clear on the left and decreased on the right with mechanical breath sounds from chest tube and air leak.  No respiratory distress, normal effort. Heart:  Regular without murmurs Abd:  Abdomen is soft, non distended and non tender. No masses are palpable.  There is no rebound and no guarding.  Neurological: Alert and oriented to person, place, and time. Coordination normal.  Skin: Skin is warm and dry. No rash noted. No diaphoretic. No erythema. No pallor.  Psychiatric: Normal mood and affect. Normal behavior. Judgment and thought content normal.    ASSESSMENT: Right empyema with persistent air leak.  Likely ruptures lung abscess.    PLAN:   I have again reviewed the indications and risks of right thoracotomy and decortication.  Postoperative care discussed.  Also discussed case with Dr. Lenore Manner.  Plan for surgery tomorrow.  Will need ICU bed postop for one day.    Nestor Lewandowsky, MD

## 2017-04-02 NOTE — H&P (View-Only) (Signed)
  Patient ID: MORTY ORTWEIN, male   DOB: 10/12/1960, 57 y.o.   MRN: [id number]  HISTORY: Overall much improved.  Good apetitie.  Pain under good control.  Not short of breath.   Vitals:   04/02/17 0509 04/02/17 1150  BP: 128/83 109/84  Pulse: 88 (!) 113  Resp: 20 17  Temp: 98.6 F (37 C) 98.1 F (36.7 C)  SpO2: 98% 98%     EXAM:    Resp: Lungs are clear on the left and decreased on the right with mechanical breath sounds from chest tube and air leak.  No respiratory distress, normal effort. Heart:  Regular without murmurs Abd:  Abdomen is soft, non distended and non tender. No masses are palpable.  There is no rebound and no guarding.  Neurological: Alert and oriented to person, place, and time. Coordination normal.  Skin: Skin is warm and dry. No rash noted. No diaphoretic. No erythema. No pallor.  Psychiatric: Normal mood and affect. Normal behavior. Judgment and thought content normal.    ASSESSMENT: Right empyema with persistent air leak.  Likely ruptures lung abscess.    PLAN:   I have again reviewed the indications and risks of right thoracotomy and decortication.  Postoperative care discussed.  Also discussed case with Dr. Lenore Manner.  Plan for surgery tomorrow.  Will need ICU bed postop for one day.    Nestor Lewandowsky, MD

## 2017-04-02 NOTE — Progress Notes (Signed)
Physical Therapy Treatment Patient Details Name: Kristopher [name] MRN: [id number] DOB: [birth year] Today's Date: 04/02/2017    History of Present Illness Pt is a65 y.o.malewith a known history ofpneumonia requiring admission in December. Patient has been admitted twice for same. At that time he was treated for HCAP pneumonia. He was discharged home on Augmentin, continued to not feel well and started getting short of breath with exertion. Patient has not had any cough, fever, or chills however he does not feel well. Pt reports he used to drink 12 beers a day but now he is cut down to 6 beers a day. He denies any nausea vomiting or diarrhea.  During this admission pt had a R chest tube placed 03/29/17.  Assessment includes: tension empyema/right lateral apex pneumothorax with reexpansion of right upper lobe status post chest tube placement, hyponatremia, EtOH abuse on CIWA protocol, and severe protein calorie malnutrition. Possible decortication surgery next date.    PT Comments    Pt is doing very well with therapy and motivated to ambulate. Pt able to tolerance significantly more distance this session with less support, only using IV pole for chest tube. Encouraged to continue mobility efforts with RN staff, however formalized PT not needed at this time. Pt performed side stepping and dynamic balance while at vending machine. Will plan to dc in house at this time. RN notified.   Follow Up Recommendations  No PT follow up     Equipment Recommendations  None recommended by PT    Recommendations for Other Services       Precautions / Restrictions Precautions Precautions: None Precaution Comments: R-sided chest tube Restrictions Weight Bearing Restrictions: No    Mobility  Bed Mobility Overal bed mobility: Independent             General bed mobility comments: able to come to EOB with ease.   Transfers Overall transfer level: Independent Equipment used: None Transfers: Sit  to/from Stand Sit to Stand: Independent         General transfer comment: able to stand multiple trials secondary to urinal use and different seated surfaces. Safe technique, slow.  Ambulation/Gait Ambulation/Gait assistance: Supervision Ambulation Distance (Feet): 600 Feet Assistive device: (1 hand on IV pole) Gait Pattern/deviations: Step-through pattern     General Gait Details: wide BOS, however no LOB noted or unsteadiness. Slow gait speed, however normal for pt. Able to carry conversation with ambulation including head turns   Stairs            Wheelchair Mobility    Modified Rankin (Stroke Patients Only)       Balance                                            Cognition Arousal/Alertness: Awake/alert Behavior During Therapy: WFL for tasks assessed/performed Overall Cognitive Status: Within Functional Limits for tasks assessed                                        Exercises Other Exercises Other Exercises: Able to ambulate to snack machine and get candy. Pt did well with reaching outside of BOS to get candy out of vending machine and maintaining balance while leaning forward. Also performed two rounds of side stepping in room to B sides using IV pole  for assistance.    General Comments        Pertinent Vitals/Pain Pain Assessment: No/denies pain    Home Living                      Prior Function            PT Goals (current goals can now be found in the care plan section) Acute Rehab PT Goals Patient Stated Goal: Improved endurance PT Goal Formulation: With patient Time For Goal Achievement: 04/02/17 Potential to Achieve Goals: Good Progress towards PT goals: Goals met/education completed, patient discharged from PT    Frequency    Min 2X/week      PT Plan Current plan remains appropriate    Co-evaluation              AM-PAC PT "6 Clicks" Daily Activity  Outcome Measure   Difficulty turning over in bed (including adjusting bedclothes, sheets and blankets)?: None Difficulty moving from lying on back to sitting on the side of the bed? : None Difficulty sitting down on and standing up from a chair with arms (e.g., wheelchair, bedside commode, etc,.)?: None Help needed moving to and from a bed to chair (including a wheelchair)?: None Help needed walking in hospital room?: None Help needed climbing 3-5 steps with a railing? : None 6 Click Score: 24    End of Session   Activity Tolerance: Patient tolerated treatment well Patient left: in bed Nurse Communication: Mobility status PT Visit Diagnosis: Muscle weakness (generalized) (M62.81);Difficulty in walking, not elsewhere classified (R26.2)     Time: 9017-2419 PT Time Calculation (min) (ACUTE ONLY): 26 min  Charges:  $Gait Training: 23-37 mins                    G Codes:       Greggory Stallion, PT, DPT [phone]    Shakeema Lippman 04/02/2017, 2:13 PM

## 2017-04-02 NOTE — Progress Notes (Signed)
[facility] [place] NAME: Kristopher [name]    MR#:  [id number]  DATE OF BIRTH:  05/16/1960  SUBJECTIVE:   Pain not adequately controlled when he moves SOB better, chest tube in place.  REVIEW OF SYSTEMS:    Review of Systems  Constitutional: Negative for fever, chills weight loss Positive for generalized weakness and fatigue HENT: Negative for ear pain, nosebleeds, congestion, facial swelling, rhinorrhea, neck pain, neck stiffness and ear discharge.   Respiratory: Improved shortness of breath denies cough Cardiovascular: Negative for chest pain, palpitations and leg swelling.  Gastrointestinal: Negative for heartburn, abdominal pain, vomiting, diarrhea or consitpation Genitourinary: Negative for dysuria, urgency, frequency, hematuria Musculoskeletal: Negative for back pain or joint pain Neurological: Negative for dizziness, seizures, syncope, focal weakness,  numbness and headaches.  Hematological: Does not bruise/bleed easily.  Psychiatric/Behavioral: Negative for hallucinations, confusion, dysphoric mood    Tolerating Diet: yes      DRUG ALLERGIES:   Allergies  Allergen Reactions  . Percocet [Oxycodone-Acetaminophen] Itching    VITALS:  Blood pressure 109/84, pulse (!) 113, temperature 98.1 F (36.7 C), temperature source Oral, resp. rate 17, height 5\' 11"  (1.803 m), weight 58.4 kg (128 lb 12 oz), SpO2 98 %.  PHYSICAL EXAMINATION:  Constitutional: Appears thin. No distress. HENT: Normocephalic. Marland Kitchen Oropharynx is clear and moist.  Eyes: Conjunctivae and EOM are normal. PERRLA, no scleral icterus.  Neck: Normal ROM. Neck supple. No JVD. No tracheal deviation. CVS: RRR, S1/S2 +, no murmurs, no gallops, no carotid bruit.  Pulmonary: Chest tube placed with decreased breath sounds right upper lobe . Abdominal: Soft. BS +,  no distension, tenderness, rebound or guarding.  Musculoskeletal: Normal range of motion. No edema and no  tenderness.  Neuro: Alert. CN 2-12 grossly intact. No focal deficits. Skin: Skin is warm and dry. No rash noted. Psychiatric: Normal mood and affect.      LABORATORY PANEL:   CBC Recent Labs  Lab 04/02/17 0349  WBC 9.2  HGB 10.6*  HCT 31.5*  PLT 631*   ------------------------------------------------------------------------------------------------------------------  Chemistries  Recent Labs  Lab 03/28/17 1839  03/30/17 0449  04/02/17 0349  NA 127*   < > 127*   < > 130*  K 4.4   < > 4.1   < > 4.4  CL 88*   < > 91*   < > 93*  CO2 26   < > 29   < > 30  GLUCOSE 116*   < > 95   < > 95  BUN 6   < > 6   < > 7  CREATININE 0.45*   < > 0.48*   < > 0.60*  CALCIUM 8.7*   < > 8.4*   < > 8.8*  MG  --   --  1.8  --   --   AST 36  --   --   --   --   ALT 25  --   --   --   --   ALKPHOS 107  --   --   --   --   BILITOT 0.9  --   --   --   --    < > = values in this interval not displayed.   ------------------------------------------------------------------------------------------------------------------  Cardiac Enzymes Recent Labs  Lab 03/28/17 1839  TROPONINI <0.03   ------------------------------------------------------------------------------------------------------------------  RADIOLOGY:  Dg Chest Port 1 View  Result Date: 04/01/2017 CLINICAL DATA:  Follow-up right chest tube positioning EXAM:  PORTABLE CHEST 1 VIEW COMPARISON:  Chest x-ray of March 31, 2017 FINDINGS: The right chest tube tip projects over the posteromedial aspect of the fourth rib. There remains a small to moderate-sized right pleural effusion. There is no pneumothorax. There is right basilar parenchymal density. The mediastinum is not shifted. The left lung is well-expanded and clear. There is stable left apical pleural thickening. The heart and pulmonary vascularity are normal. The mediastinum is normal in width. The observed bony thorax is unremarkable. IMPRESSION: Stable appearance of the  moderate-sized right pleural effusion/empyema. Persistent right basilar atelectasis or pneumonia. No pneumothorax. Stable positioning of the right-sided chest tube. Electronically Signed   By: David  Martinique M.D.   On: 04/01/2017 07:42     ASSESSMENT AND PLAN:   57 year old male with EtOH abuse who presents with shortness of breath and found to have large empyema and CT scan  1. Tension Empyema/right lateral apex pneumothorax with reexpansion of right upper lobe: Patient is status post chest tube placement. will require decortication for empyema which is planned for Thursday.  Appreciate Dr. Genevive Bi consult.  Initial cultures thus far are negative. Follow-up on final cultures from empyema  Toradol for pain  Continue antibiotic for now.  2. Hyponatremia: This is due to empyema and dehydration Follow sodium levels  3. EtOH abuse: Uneventful detox so far  Continue CIWA protocol  4. Severe protein calorie malnutrition: Dietary consultation requested and appreciated. Continue dietary supplement  5. One out of 2 gram-positive cocci staph species which is contaminant  D/w dr Genevive Bi and pulmonary.  Management plans discussed with the patient and he is in agreement.  CODE STATUS: FULL  TOTAL TIME TAKING CARE OF THIS PATIENT: 25 minutes.   PT recommends home with home health upon discharge.  POSSIBLE D/C 2-3 days, DEPENDING ON CLINICAL CONDITION.   Vaughan Basta M.D on 04/02/2017 at 1:19 PM  Between 7am to 6pm - Pager - [phone] After 6pm go to [email or web address] - password [email or web address] [facility]  Office  [phone]  CC: Primary care physician; Patient, No Pcp Per  Note: This dictation was prepared with Dragon dictation along with smaller phrase technology. Any transcriptional errors that result from this process are unintentional.

## 2017-04-02 NOTE — Progress Notes (Signed)
* [facility]     Assessment and Plan:  Necrotizing pneumonia complicated by severe right lung empyema, status post tube thoracostomy, minimal/slow improvement.  History of alcohol abuse. Nicotine abuse with COPD.   --Continue empyema management per thoracic surgery, continue chest tube drainage.  --Continue abx.  --Outpatient eval for COPD once recovered.  --Discussed the importance of smoking cessation.  --Repeat CXR.    Date: 04/02/2017  MRN# [id number] Kristopher [name] 1960/05/04   Kristopher [name] is a 57 y.o. old male seen in follow up for chief complaint of  Chief Complaint  Patient presents with  . Shortness of Breath     HPI:  Pt is awake, alert, no new complaints. He feels a bit better today.   Pleural fluid culture negative thus far.   Medication:    Current Facility-Administered Medications:  .  0.9 %  sodium chloride infusion, , Intravenous, Continuous, Mody, Sital, MD, Last Rate: 75 mL/hr at 04/02/17 0521 .  acetaminophen (TYLENOL) tablet 650 mg, 650 mg, Oral, Q6H PRN **OR** acetaminophen (TYLENOL) suppository 650 mg, 650 mg, Rectal, Q6H PRN, Dustin Flock, MD .  feeding supplement (ENSURE ENLIVE) (ENSURE ENLIVE) liquid 237 mL, 237 mL, Oral, TID BM, Mody, Sital, MD, 237 mL at 34/19/62 2297 .  folic acid (FOLVITE) tablet 1 mg, 1 mg, Oral, Daily, Dustin Flock, MD, 1 mg at 04/01/17 0914 .  heparin injection 5,000 Units, 5,000 Units, Subcutaneous, Q8H, Nestor Lewandowsky, MD, 5,000 Units at 04/02/17 0522 .  ketorolac (TORADOL) 30 MG/ML injection 30 mg, 30 mg, Intravenous, Q6H PRN, Benjie Karvonen, Sital, MD, 30 mg at 04/02/17 0522 .  LORazepam (ATIVAN) tablet 1 mg, 1 mg, Oral, Q6H PRN, Wilhelmina Mcardle, MD, 1 mg at 04/01/17 2109 .  morphine 2 MG/ML injection 2 mg, 2 mg, Intravenous, Q4H PRN, Mody, Sital, MD .  multivitamin with minerals tablet 1 tablet, 1 tablet, Oral, Daily, Dustin Flock, MD, 1 tablet at 04/01/17 0914 .  [DISCONTINUED] ondansetron (ZOFRAN)  tablet 4 mg, 4 mg, Oral, Q6H PRN **OR** ondansetron (ZOFRAN) injection 4 mg, 4 mg, Intravenous, Q6H PRN, Dustin Flock, MD .  oxyCODONE-acetaminophen (PERCOCET) 7.5-325 MG per tablet 1-2 tablet, 1-2 tablet, Oral, Q4H PRN, Mikael Spray, NP, 2 tablet at 04/01/17 1636 .  piperacillin-tazobactam (ZOSYN) IVPB 3.375 g, 3.375 g, Intravenous, Q8H, Dustin Flock, MD, Last Rate: 12.5 mL/hr at 04/02/17 0521, 3.375 g at 04/02/17 0521 .  senna-docusate (Senokot-S) tablet 1 tablet, 1 tablet, Oral, QHS, Mody, Sital, MD, 1 tablet at 04/01/17 2109 .  thiamine (VITAMIN B-1) tablet 100 mg, 100 mg, Oral, Daily, 100 mg at 04/01/17 0914 **OR** [DISCONTINUED] thiamine (B-1) injection 100 mg, 100 mg, Intravenous, Daily, Dustin Flock, MD   Allergies:  Percocet [oxycodone-acetaminophen]  Review of Systems: Gen:  Denies  fever, sweats. HEENT: Denies blurred vision. Cvc:  No dizziness, chest pain or heaviness Resp:   Denies cough or sputum porduction. Gi: Denies swallowing difficulty, stomach pain. constipation, bowel incontinence Gu:  Denies bladder incontinence, burning urine Ext:   No Joint pain, stiffness. Skin: No skin rash, easy bruising. Endoc:  No polyuria, polydipsia. Psych: No depression, insomnia. Other:  All other systems were reviewed and found to be negative other than what is mentioned in the HPI.   Physical Examination:   VS: BP 128/83 (BP Location: Left Arm)   Pulse 88   Temp 98.6 F (37 C) (Oral)   Resp 20   Ht 5\' 11"  (1.803 m)   Wt 128 lb 12  oz (58.4 kg)   SpO2 98%   BMI 17.96 kg/m   General Appearance: No distress  Neuro:without focal findings,  speech normal,  HEENT: PERRLA, EOM intact. Pulmonary: continued decreased air entry in right lung.  CardiovascularNormal S1,S2.  No m/r/g.   Abdomen: Benign, Soft, non-tender. Renal:  No costovertebral tenderness  GU:  Not performed at this time. Endoc: No evident thyromegaly, no signs of acromegaly. Skin:   warm, no  rash. Extremities: normal, no cyanosis, clubbing.   LABORATORY PANEL:   CBC Recent Labs  Lab 04/02/17 0349  WBC 9.2  HGB 10.6*  HCT 31.5*  PLT 631*   ------------------------------------------------------------------------------------------------------------------  Chemistries  Recent Labs  Lab 03/28/17 1839  03/30/17 0449  04/02/17 0349  NA 127*   < > 127*   < > 130*  K 4.4   < > 4.1   < > 4.4  CL 88*   < > 91*   < > 93*  CO2 26   < > 29   < > 30  GLUCOSE 116*   < > 95   < > 95  BUN 6   < > 6   < > 7  CREATININE 0.45*   < > 0.48*   < > 0.60*  CALCIUM 8.7*   < > 8.4*   < > 8.8*  MG  --   --  1.8  --   --   AST 36  --   --   --   --   ALT 25  --   --   --   --   ALKPHOS 107  --   --   --   --   BILITOT 0.9  --   --   --   --    < > = values in this interval not displayed.   ------------------------------------------------------------------------------------------------------------------  Cardiac Enzymes Recent Labs  Lab 03/28/17 1839  TROPONINI <0.03   ------------------------------------------------------------  RADIOLOGY:   No results found for this or any previous visit. Results for orders placed during the hospital encounter of 03/28/17  DG Chest 2 View   Narrative CLINICAL DATA:  Patient reports SOB onset today. No known heart or lung conditions. Current smoker.  EXAM: CHEST  2 VIEW  COMPARISON:  Chest CT 03/02/2017, chest radiograph 03/04/2017  FINDINGS: Normal cardiac silhouette. There is continued increase in fluid within the RIGHT hemithorax. The RIGHT hemithorax is near completely opacified (95%). This is increased significantly from comparison chest radiograph. There is mild mass effect upon the mediastinum.  IMPRESSION: 1. Increasing pleural fluid in the RIGHT hemithorax with near complete opacification of the RIGHT hemithorax. This is increased significantly from 03/04/2017 where patient was found to have pneumonia on chest CT. 2.  Mild shift of the mediastinum by the increasing pleural fluid. 3. Consider thoracentesis with cytology to evaluate for pulmonary malignancy.   Electronically Signed   By: Suzy Bouchard M.D.   On: 03/28/2017 19:11    ------------------------------------------------------------------------------------------------------------------  Thank  you for allowing [facility] to assist in the care of your patient. Our recommendations are noted above.  Please contact us if we can be of further service.   Marda Stalker, MD.  [facility] Office Number: [phone]  Patricia Pesa, M.D.  Merton Border, M.D  04/02/2017

## 2017-04-03 ENCOUNTER — Inpatient Hospital Stay: Payer: 59

## 2017-04-03 ENCOUNTER — Encounter: Payer: Self-pay | Admitting: Anesthesiology

## 2017-04-03 ENCOUNTER — Inpatient Hospital Stay: Payer: 59 | Admitting: Anesthesiology

## 2017-04-03 ENCOUNTER — Other Ambulatory Visit: Payer: Self-pay

## 2017-04-03 ENCOUNTER — Encounter
Admission: EM | Disposition: A | Discharge status: Home / Self Care | Payer: Self-pay | Source: Home / Self Care | Attending: Internal Medicine

## 2017-04-03 DIAGNOSIS — J852 Abscess of lung without pneumonia: Secondary | ICD-10-CM | POA: Diagnosis present

## 2017-04-03 DIAGNOSIS — J851 Abscess of lung with pneumonia: Secondary | ICD-10-CM

## 2017-04-03 HISTORY — PX: VIDEO BRONCHOSCOPY: SHX5072

## 2017-04-03 HISTORY — PX: DECORTICATION: SHX5101

## 2017-04-03 HISTORY — PX: THORACOTOMY: SHX5074

## 2017-04-03 LAB — BASIC METABOLIC PANEL
Anion gap: 10 (ref 5–15)
BUN: 7 mg/dL (ref 6–20)
CHLORIDE: 94 mmol/L — AB (ref 101–111)
CO2: 26 mmol/L (ref 22–32)
Calcium: 8.5 mg/dL — ABNORMAL LOW (ref 8.9–10.3)
Creatinine, Ser: 0.47 mg/dL — ABNORMAL LOW (ref 0.61–1.24)
GFR calc Af Amer: >60 mL/min (ref >60)
GFR calc non Af Amer: >60 mL/min (ref >60)
Glucose, Bld: 106 mg/dL — ABNORMAL HIGH (ref 65–99)
POTASSIUM: 3.9 mmol/L (ref 3.5–5.1)
Sodium: 130 mmol/L — ABNORMAL LOW (ref 135–145)

## 2017-04-03 LAB — CBC
HEMATOCRIT: 32.9 % — AB (ref 40.0–52.0)
HEMOGLOBIN: 11 g/dL — AB (ref 13.0–18.0)
MCH: 30.4 pg (ref 26.0–34.0)
MCHC: 33.5 g/dL (ref 32.0–36.0)
MCV: 90.9 fL (ref 80.0–100.0)
Platelets: 611 10*3/uL — ABNORMAL HIGH (ref 150–440)
RBC: 3.62 MIL/uL — AB (ref 4.40–5.90)
RDW: 14.5 % (ref 11.5–14.5)
WBC: 16.6 10*3/uL — ABNORMAL HIGH (ref 3.8–10.6)

## 2017-04-03 LAB — GLUCOSE, CAPILLARY: GLUCOSE-CAPILLARY: 99 mg/dL (ref 65–99)

## 2017-04-03 SURGERY — BRONCHOSCOPY, VIDEO-ASSISTED
Anesthesia: General | Laterality: Right

## 2017-04-03 MED ORDER — BISACODYL 5 MG PO TBEC
10.0000 mg | DELAYED_RELEASE_TABLET | Freq: Every day | ORAL | Status: DC
  Administered 2017-04-03 – 2017-04-11 (×9): 10 mg via ORAL
  Filled 2017-04-03 (×9): qty 2

## 2017-04-03 MED ORDER — BUPIVACAINE LIPOSOME 1.3 % IJ SUSP
INTRAMUSCULAR | Status: AC
  Filled 2017-04-03: qty 20

## 2017-04-03 MED ORDER — ACETAMINOPHEN 10 MG/ML IV SOLN
INTRAVENOUS | Status: DC | PRN
  Administered 2017-04-03: 1000 mg via INTRAVENOUS

## 2017-04-03 MED ORDER — SUGAMMADEX SODIUM 200 MG/2ML IV SOLN
INTRAVENOUS | Status: DC | PRN
  Administered 2017-04-03: 116.8 mg via INTRAVENOUS

## 2017-04-03 MED ORDER — LIDOCAINE HCL (CARDIAC) 20 MG/ML IV SOLN
INTRAVENOUS | Status: DC | PRN
  Administered 2017-04-03: 50 mg via INTRAVENOUS

## 2017-04-03 MED ORDER — PROPOFOL 10 MG/ML IV BOLUS
INTRAVENOUS | Status: DC | PRN
  Administered 2017-04-03: 110 mg via INTRAVENOUS

## 2017-04-03 MED ORDER — PROPOFOL 10 MG/ML IV BOLUS
INTRAVENOUS | Status: AC
  Filled 2017-04-03: qty 20

## 2017-04-03 MED ORDER — ALBUTEROL SULFATE (2.5 MG/3ML) 0.083% IN NEBU: 2.5000 mg | INHALATION_SOLUTION | RESPIRATORY_TRACT | Status: DC

## 2017-04-03 MED ORDER — ACETAMINOPHEN 10 MG/ML IV SOLN
INTRAVENOUS | Status: AC
  Filled 2017-04-03: qty 100

## 2017-04-03 MED ORDER — ROCURONIUM BROMIDE 50 MG/5ML IV SOLN
INTRAVENOUS | Status: AC
  Filled 2017-04-03: qty 1

## 2017-04-03 MED ORDER — LACTATED RINGERS IV SOLN
INTRAVENOUS | Status: DC | PRN
  Administered 2017-04-03 (×2): via INTRAVENOUS

## 2017-04-03 MED ORDER — ALBUTEROL SULFATE (2.5 MG/3ML) 0.083% IN NEBU
2.5000 mg | INHALATION_SOLUTION | RESPIRATORY_TRACT | Status: DC
  Administered 2017-04-03 – 2017-04-04 (×3): 2.5 mg via RESPIRATORY_TRACT
  Filled 2017-04-03 (×2): qty 3

## 2017-04-03 MED ORDER — FENTANYL CITRATE (PF) 100 MCG/2ML IJ SOLN
25.0000 ug | INTRAMUSCULAR | Status: DC | PRN
  Administered 2017-04-03 (×4): 25 ug via INTRAVENOUS

## 2017-04-03 MED ORDER — BUPIVACAINE LIPOSOME 1.3 % IJ SUSP
INTRAMUSCULAR | Status: DC | PRN
  Administered 2017-04-03: 70 mL

## 2017-04-03 MED ORDER — ROCURONIUM BROMIDE 100 MG/10ML IV SOLN
INTRAVENOUS | Status: DC | PRN
  Administered 2017-04-03: 40 mg via INTRAVENOUS
  Administered 2017-04-03: 10 mg via INTRAVENOUS
  Administered 2017-04-03: 15 mg via INTRAVENOUS
  Administered 2017-04-03: 20 mg via INTRAVENOUS
  Administered 2017-04-03: 10 mg via INTRAVENOUS
  Administered 2017-04-03: 20 mg via INTRAVENOUS
  Administered 2017-04-03: 10 mg via INTRAVENOUS
  Administered 2017-04-03: 5 mg via INTRAVENOUS

## 2017-04-03 MED ORDER — MIDAZOLAM HCL 2 MG/2ML IJ SOLN
INTRAMUSCULAR | Status: DC | PRN
  Administered 2017-04-03: 2 mg via INTRAVENOUS

## 2017-04-03 MED ORDER — DEXTROSE-NACL 5-0.45 % IV SOLN
INTRAVENOUS | Status: DC
  Administered 2017-04-03 – 2017-04-04 (×2): via INTRAVENOUS

## 2017-04-03 MED ORDER — ONDANSETRON HCL 4 MG/2ML IJ SOLN: 4.0000 mg | Freq: Four times a day (QID) | INTRAMUSCULAR | Status: DC | PRN

## 2017-04-03 MED ORDER — MORPHINE SULFATE (PF) 2 MG/ML IV SOLN
2.0000 mg | Freq: Once | INTRAVENOUS | Status: AC
  Administered 2017-04-03: 2 mg via INTRAVENOUS

## 2017-04-03 MED ORDER — LACTATED RINGERS IV SOLN: INTRAVENOUS | Status: DC

## 2017-04-03 MED ORDER — MIDAZOLAM HCL 2 MG/2ML IJ SOLN
INTRAMUSCULAR | Status: AC
  Filled 2017-04-03: qty 2

## 2017-04-03 MED ORDER — SUCCINYLCHOLINE CHLORIDE 20 MG/ML IJ SOLN
INTRAMUSCULAR | Status: DC | PRN
  Administered 2017-04-03: 100 mg via INTRAVENOUS

## 2017-04-03 MED ORDER — ONDANSETRON HCL 4 MG/2ML IJ SOLN
INTRAMUSCULAR | Status: AC
  Filled 2017-04-03: qty 2

## 2017-04-03 MED ORDER — FENTANYL CITRATE (PF) 100 MCG/2ML IJ SOLN
INTRAMUSCULAR | Status: AC
  Filled 2017-04-03: qty 2

## 2017-04-03 MED ORDER — TRAMADOL HCL 50 MG PO TABS
50.0000 mg | ORAL_TABLET | Freq: Four times a day (QID) | ORAL | Status: DC
  Administered 2017-04-03 – 2017-04-04 (×2): 100 mg via ORAL
  Administered 2017-04-04: 50 mg via ORAL
  Filled 2017-04-03 (×2): qty 2
  Filled 2017-04-03: qty 1

## 2017-04-03 MED ORDER — MORPHINE SULFATE (PF) 2 MG/ML IV SOLN
2.0000 mg | INTRAVENOUS | Status: DC | PRN
  Administered 2017-04-04 (×4): 4 mg via INTRAVENOUS
  Filled 2017-04-03 (×4): qty 2

## 2017-04-03 MED ORDER — SUGAMMADEX SODIUM 200 MG/2ML IV SOLN
INTRAVENOUS | Status: AC
  Filled 2017-04-03: qty 2

## 2017-04-03 MED ORDER — ALBUTEROL SULFATE (2.5 MG/3ML) 0.083% IN NEBU
INHALATION_SOLUTION | RESPIRATORY_TRACT | Status: AC
  Filled 2017-04-03: qty 3

## 2017-04-03 MED ORDER — FENTANYL CITRATE (PF) 100 MCG/2ML IJ SOLN
INTRAMUSCULAR | Status: AC
  Filled 2017-04-03: qty 4

## 2017-04-03 MED ORDER — PHENYLEPHRINE HCL 10 MG/ML IJ SOLN
INTRAMUSCULAR | Status: DC | PRN
  Administered 2017-04-03 (×8): 100 ug via INTRAVENOUS
  Administered 2017-04-03: 150 ug via INTRAVENOUS
  Administered 2017-04-03: 100 ug via INTRAVENOUS
  Administered 2017-04-03: 150 ug via INTRAVENOUS
  Administered 2017-04-03: 100 ug via INTRAVENOUS

## 2017-04-03 MED ORDER — SEVOFLURANE IN SOLN
RESPIRATORY_TRACT | Status: AC
  Filled 2017-04-03: qty 250

## 2017-04-03 MED ORDER — SODIUM CHLORIDE 0.9 % IJ SOLN
INTRAMUSCULAR | Status: AC
  Filled 2017-04-03: qty 50

## 2017-04-03 MED ORDER — FOLIC ACID 1 MG PO TABS
1.0000 mg | ORAL_TABLET | Freq: Every day | ORAL | Status: DC
  Administered 2017-04-04 – 2017-04-11 (×8): 1 mg via ORAL
  Filled 2017-04-03 (×8): qty 1

## 2017-04-03 MED ORDER — BUPIVACAINE HCL (PF) 0.5 % IJ SOLN
INTRAMUSCULAR | Status: AC
  Filled 2017-04-03: qty 30

## 2017-04-03 MED ORDER — MORPHINE SULFATE (PF) 4 MG/ML IV SOLN
1.0000 mg | INTRAVENOUS | Status: DC | PRN
  Administered 2017-04-03: 1 mg via INTRAVENOUS
  Administered 2017-04-03: 2 mg via INTRAVENOUS
  Filled 2017-04-03 (×3): qty 1

## 2017-04-03 MED ORDER — ONDANSETRON HCL 4 MG/2ML IJ SOLN: 4.0000 mg | Freq: Once | INTRAMUSCULAR | Status: DC | PRN

## 2017-04-03 MED ORDER — LIDOCAINE HCL (PF) 2 % IJ SOLN
INTRAMUSCULAR | Status: AC
  Filled 2017-04-03: qty 10

## 2017-04-03 MED ORDER — FENTANYL CITRATE (PF) 100 MCG/2ML IJ SOLN
INTRAMUSCULAR | Status: AC
  Administered 2017-04-03: 25 ug via INTRAVENOUS
  Filled 2017-04-03: qty 2

## 2017-04-03 MED ORDER — PIPERACILLIN-TAZOBACTAM 3.375 G IVPB
INTRAVENOUS | Status: AC
  Filled 2017-04-03: qty 50

## 2017-04-03 MED ORDER — VITAMIN B-1 100 MG PO TABS
100.0000 mg | ORAL_TABLET | Freq: Every day | ORAL | Status: DC
  Administered 2017-04-04 – 2017-04-11 (×8): 100 mg via ORAL
  Filled 2017-04-03 (×8): qty 1

## 2017-04-03 MED ORDER — BUPIVACAINE HCL (PF) 0.5 % IJ SOLN
INTRAMUSCULAR | Status: DC | PRN
  Administered 2017-04-03: 30 mL

## 2017-04-03 MED ORDER — FENTANYL CITRATE (PF) 100 MCG/2ML IJ SOLN
INTRAMUSCULAR | Status: DC | PRN
  Administered 2017-04-03: 50 ug via INTRAVENOUS
  Administered 2017-04-03: 25 ug via INTRAVENOUS
  Administered 2017-04-03: 50 ug via INTRAVENOUS
  Administered 2017-04-03 (×3): 25 ug via INTRAVENOUS
  Administered 2017-04-03 (×3): 50 ug via INTRAVENOUS

## 2017-04-03 MED ORDER — PIPERACILLIN-TAZOBACTAM 3.375 G IVPB
3.3750 g | Freq: Three times a day (TID) | INTRAVENOUS | Status: DC
  Administered 2017-04-03 – 2017-04-07 (×11): 3.375 g via INTRAVENOUS
  Filled 2017-04-03 (×11): qty 50

## 2017-04-03 SURGICAL SUPPLY — 64 items
BENZOIN TINCTURE PRP APPL 2/3 (GAUZE/BANDAGES/DRESSINGS) ×3 IMPLANT
BNDG COHESIVE 4X5 TAN STRL (GAUZE/BANDAGES/DRESSINGS) IMPLANT
BRONCHOSCOPE PED SLIM DISP (MISCELLANEOUS) ×3 IMPLANT
CANISTER SUCT 1200ML W/VALVE (MISCELLANEOUS) ×3 IMPLANT
CATH URET ROBINSON 16FR STRL (CATHETERS) ×3 IMPLANT
CHLORAPREP W/TINT 26ML (MISCELLANEOUS) ×6 IMPLANT
CNTNR SPEC 2.5X3XGRAD LEK (MISCELLANEOUS) ×8
CONT SPEC 4OZ STER OR WHT (MISCELLANEOUS) ×4
CONTAINER SPEC 2.5X3XGRAD LEK (MISCELLANEOUS) ×8 IMPLANT
CUTTER ECHEON FLEX ENDO 45 340 (ENDOMECHANICALS) IMPLANT
DRAIN CHANNEL 19F RND (DRAIN) IMPLANT
DRAIN CHANNEL 28F RND 3/8 FF (WOUND CARE) ×12 IMPLANT
DRAIN CHEST DRY SUCT SGL (MISCELLANEOUS) ×3 IMPLANT
DRAPE C-SECTION (MISCELLANEOUS) ×3 IMPLANT
DRAPE MAG INST 16X20 L/F (DRAPES) ×3 IMPLANT
DRSG OPSITE POSTOP 4X6 (GAUZE/BANDAGES/DRESSINGS) ×3 IMPLANT
DRSG OPSITE POSTOP 4X8 (GAUZE/BANDAGES/DRESSINGS) ×6 IMPLANT
DRSG TELFA 3X8 NADH (GAUZE/BANDAGES/DRESSINGS) ×3 IMPLANT
ELECT BLADE 6.5 EXT (BLADE) ×3 IMPLANT
ELECT CAUTERY BLADE TIP 2.5 (TIP) ×3
ELECT REM PT RETURN 9FT ADLT (ELECTROSURGICAL) ×3
ELECTRODE CAUTERY BLDE TIP 2.5 (TIP) ×2 IMPLANT
ELECTRODE REM PT RTRN 9FT ADLT (ELECTROSURGICAL) ×2 IMPLANT
GAUZE SPONGE 4X4 12PLY STRL (GAUZE/BANDAGES/DRESSINGS) ×3 IMPLANT
GLOVE SURG SYN 7.5  E (GLOVE) ×2
GLOVE SURG SYN 7.5 E (GLOVE) ×4 IMPLANT
GOWN STRL REUS W/ TWL LRG LVL3 (GOWN DISPOSABLE) ×6 IMPLANT
GOWN STRL REUS W/TWL LRG LVL3 (GOWN DISPOSABLE) ×3
KIT RM TURNOVER STRD PROC AR (KITS) ×3 IMPLANT
LABEL OR SOLS (LABEL) ×3 IMPLANT
LOOP RED MAXI  1X406MM (MISCELLANEOUS) ×1
LOOP VESSEL MAXI 1X406 RED (MISCELLANEOUS) ×2 IMPLANT
MARKER SKIN DUAL TIP RULER LAB (MISCELLANEOUS) ×3 IMPLANT
PACK BASIN MAJOR ARMC (MISCELLANEOUS) ×3 IMPLANT
RELOAD STAPLER LINE PROX 30 GR (STAPLE) ×2 IMPLANT
SPONGE KITTNER 5P (MISCELLANEOUS) ×15 IMPLANT
STAPLER RELOAD LINE PROX 30 GR (STAPLE) ×3
STAPLER SKIN PROX 35W (STAPLE) ×3 IMPLANT
STAPLER VASCULAR ECHELON 35 (CUTTER) IMPLANT
STRIP CLOSURE SKIN 1/2X4 (GAUZE/BANDAGES/DRESSINGS) ×3 IMPLANT
SUCTION FRAZIER HANDLE 10FR (MISCELLANEOUS) ×1
SUCTION TUBE FRAZIER 10FR DISP (MISCELLANEOUS) ×2 IMPLANT
SUT CHROMIC 3 0 SH 27 (SUTURE) ×3 IMPLANT
SUT CHROMIC 5 0 RB 1 27 (SUTURE) ×3 IMPLANT
SUT MNCRL AB 3-0 PS2 27 (SUTURE) IMPLANT
SUT PROLENE 1 CT (SUTURE) ×18 IMPLANT
SUT PROLENE 5 0 RB 1 DA (SUTURE) IMPLANT
SUT SILK 0 (SUTURE) ×1
SUT SILK 0 30XBRD TIE 6 (SUTURE) ×2 IMPLANT
SUT SILK 1 SH (SUTURE) ×21 IMPLANT
SUT VIC AB 0 CT1 36 (SUTURE) ×6 IMPLANT
SUT VIC AB 2-0 CT1 27 (SUTURE) ×2
SUT VIC AB 2-0 CT1 TAPERPNT 27 (SUTURE) ×4 IMPLANT
SUT VICRYL 2 TP 1 (SUTURE) ×12 IMPLANT
SYR 10ML SLIP (SYRINGE) ×3 IMPLANT
SYR BULB IRRIG 60ML STRL (SYRINGE) ×3 IMPLANT
TAPE ADH 3 LX (MISCELLANEOUS) ×3 IMPLANT
TAPE TRANSPORE STRL 2 31045 (GAUZE/BANDAGES/DRESSINGS) IMPLANT
TRAY FOLEY W/METER SILVER 16FR (SET/KITS/TRAYS/PACK) ×3 IMPLANT
TROCAR FLEXIPATH 20X80 (ENDOMECHANICALS) IMPLANT
TROCAR FLEXIPATH THORACIC 15MM (ENDOMECHANICALS) IMPLANT
TUBING CONNECTING 10 (TUBING) ×3 IMPLANT
WATER STERILE IRR 1000ML POUR (IV SOLUTION) ×3 IMPLANT
YANKAUER SUCT BULB TIP FLEX NO (MISCELLANEOUS) ×3 IMPLANT

## 2017-04-03 NOTE — Progress Notes (Signed)
Nutrition Follow Up Note   DOCUMENTATION CODES:   Severe malnutrition in context of chronic illness, Underweight  INTERVENTION:   Continue Ensure Enlive po TID, each supplement provides 350 kcal and 20 grams of protein.  Continue MVI daily, folic acid 1 mg daily, thiamine 100 mg daily in setting of EtOH abuse.  NUTRITION DIAGNOSIS:   Severe Malnutrition related to chronic illness(EtOH abuse, recurrent necrotizing PNA with empyema) as evidenced by severe fat depletion, severe muscle depletion.  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   Pt doing well; eating 100% of meals and drinking some Ensure. Pt NPO today for right thoracotomy and decortication. No new weight since 1/19; will request new weight. Chest tube in place; 130ml drainage x 24 hrs. Continue supplements and vitamins.    Medications reviewed and include: folic acid, MVI, senokot, thiamine, NaCl @75ml /hr, zosyn  Labs reviewed: Na 130(L), Cl 93(L), K 4.4 wnl, creat 0.60(L), Ca 8.8(L)- 1/23 P 4.1 wnl, Mg 1.8 wnl- 1/20  Diet Order:  Diet NPO time specified  EDUCATION NEEDS:   Education needs have been addressed  Skin:  Incision chest  Last BM:  1/23  Height:   Ht Readings from Last 1 Encounters:  03/29/17 5\' 11"  (1.803 m)    Weight:   Wt Readings from Last 1 Encounters:  03/29/17 128 lb 12 oz (58.4 kg)    Ideal Body Weight:  78.2 kg  BMI:  Body mass index is 17.96 kg/m.  Estimated Nutritional Needs:   Kcal:  8341-9622 (MSJ x 1.3-1.5)  Protein:  85-100 grams (1.5-1.7 grams/kg)  Fluid:  1.7-2 L/day (30-35 mL/kg)  Koleen Distance MS, RD, LDN Pager #-  [phone] After Hours Pager: [phone]

## 2017-04-03 NOTE — Progress Notes (Signed)
Pt returned from OR s/p preoperative bronchoscopy with right thoracotomy and decortication of the visceral and parietal pleura with intra-pleural drainage of lung abscess. He has 4 right sided 28 Fr. chest tubes that are Y connected, chest tube to suction at 20 cm draining sanguineous drainage, air leak present and mild subcutaneous emphysema present.  He is alert and oriented, follows commands, diminished throughout, vss, NSR on cardiac monitor, and pain free PCCM will continue to assist in plan of care.  Marda Stalker, [place] Pager [phone] (please enter 7 digits) PCCM Consult Pager [phone] (please enter 7 digits)

## 2017-04-03 NOTE — Transfer of Care (Signed)
Immediate Anesthesia Transfer of Care Note  Patient: Kristopher [name]  Procedure(s) Performed: PREOP BRONCHOSCOPY (N/A ) THORACOTOMY MAJOR (Right ) DECORTICATION (N/A )  Patient Location: PACU  Anesthesia Type:General  Level of Consciousness: sedated and responds to stimulation  Airway & Oxygen Therapy: Patient Spontanous Breathing and Patient connected to face mask oxygen  Post-op Assessment: Report given to RN and Post -op Vital signs reviewed and stable  Post vital signs: Reviewed and stable  Last Vitals:  Vitals:   04/03/17 1115 04/03/17 1815  BP: 139/90 90/63  Pulse: 90 81  Resp: (!) 22 (!) 24  Temp: (!) 38 C 36.4 C  SpO2: 97% 100%    Last Pain:  Vitals:   04/03/17 1115  TempSrc: Temporal  PainSc: 5       Patients Stated Pain Goal: 0 (03/49/17 9150)  Complications: No apparent anesthesia complications

## 2017-04-03 NOTE — Anesthesia Post-op Follow-up Note (Signed)
Anesthesia QCDR form completed.        

## 2017-04-03 NOTE — Anesthesia Preprocedure Evaluation (Addendum)
Anesthesia Evaluation  Patient identified by MRN, date of birth, ID band Patient awake    Reviewed: Allergy & Precautions, NPO status , Patient's Chart, lab work & pertinent test results  Airway Mallampati: II  TM Distance: <3 FB Neck ROM: limited    Dental   Pulmonary pneumonia, former smoker,           Cardiovascular negative cardio ROS       Neuro/Psych negative neurological ROS  negative psych ROS   GI/Hepatic negative GI ROS, Neg liver ROS,   Endo/Other  negative endocrine ROS  Renal/GU negative Renal ROS     Musculoskeletal negative musculoskeletal ROS (+)   Abdominal   Peds negative pediatric ROS (+)  Hematology negative hematology ROS (+)   Anesthesia Other Findings   Reproductive/Obstetrics                            Anesthesia Physical Anesthesia Plan  ASA: II  Anesthesia Plan: General   Post-op Pain Management:    Induction: Intravenous  PONV Risk Score and Plan:   Airway Management Planned: Double Lumen EBT  Additional Equipment:   Intra-op Plan:   Post-operative Plan: Extubation in OR  Informed Consent: I have reviewed the patients History and Physical, chart, labs and discussed the procedure including the risks, benefits and alternatives for the proposed anesthesia with the patient or authorized representative who has indicated his/her understanding and acceptance.   Dental advisory given  Plan Discussed with: CRNA and Surgeon  Anesthesia Plan Comments:         Anesthesia Quick Evaluation

## 2017-04-03 NOTE — Anesthesia Procedure Notes (Signed)
Procedure Name: Intubation Performed by: Lance Muss, CRNA Pre-anesthesia Checklist: Patient identified, Patient being monitored, Timeout performed, Emergency Drugs available and Suction available Patient Re-evaluated:Patient Re-evaluated prior to induction Oxygen Delivery Method: Circle system utilized Preoxygenation: Pre-oxygenation with 100% oxygen Induction Type: IV induction Ventilation: Mask ventilation without difficulty Laryngoscope Size: Mac and 3 Grade View: Grade I Tube type: Oral Endobronchial tube: Left and Double lumen EBT and 39 Fr Number of attempts: 1 Airway Equipment and Method: Stylet and LTA kit utilized Placement Confirmation: ETT inserted through vocal cords under direct vision,  positive ETCO2 and breath sounds checked- equal and bilateral Secured at: 29 cm Tube secured with: Tape Dental Injury: Teeth and Oropharynx as per pre-operative assessment

## 2017-04-03 NOTE — Interval H&P Note (Signed)
History and Physical Interval Note:  04/03/2017 12:10 PM  Kristopher [name]  has presented today for surgery, with the diagnosis of n/a  The various methods of treatment have been discussed with the patient and family. After consideration of risks, benefits and other options for treatment, the patient has consented to  Procedure(s): PREOP BRONCHOSCOPY (N/A) THORACOTOMY MAJOR (Right) DECORTICATION (N/A) as a surgical intervention .  The patient's history has been reviewed, patient examined, no change in status, stable for surgery.  I have reviewed the patient's chart and labs.  Questions were answered to the patient's satisfaction.     Nestor Lewandowsky

## 2017-04-03 NOTE — Op Note (Signed)
  04/03/2017  6:28 PM  PATIENT:  Kristopher [name]  57 y.o. male  PRE-OPERATIVE DIAGNOSIS: Right lower lobe lung abscess with empyema  POST-OPERATIVE DIAGNOSIS: Same  PROCEDURE: Preoperative bronchoscopy with right thoracotomy and decortication of the visceral and parietal pleura with intra-pleural drainage of lung abscess  SURGEON:  Surgeon(s) and Role:    Nestor Lewandowsky, MD - Primary  ASSISTANTS: Ferrel Logan PAS  ANESTHESIA: General  INDICATIONS FOR PROCEDURE this is a 57 year old gentleman who has had a right lower lobe necrotizing pneumonia with a right lower lobe abscess for about a month.  He has been managed with a chest tube but failed to completely reexpand the lung and had a persistent air leak.  He was apprised of the indications and risks of surgery and he gave his informed consent.  DICTATION: Patient was brought to the operating suite and placed in the supine position.  General endotracheal anesthesia was given with a double-lumen tube.  Preoperative bronchoscopy was carried out.  The right lower lobe was visualized but there were extensive secretions throughout although there was no obvious tumor within any segment of the right lung.  The tube was then secured and the patient was turned for a right thoracotomy.  The previous chest tube was removed.  The patient was then prepped and draped in the usual sterile fashion.  A posterior lateral fifth interspace thoracotomy was performed.  The latissimus was divided but the serratus was spared.  The chest was entered.  Chest wall retractors were placed.  There were extensive intrapleural fibrinous deposits of purulent material.  I estimated that there was probably at least 500 cc or more of pus within the pleural space.  A complete decortication of the lung was then carried out.  Within the superior segment of the right lower lobe the lung was necrotic with a large abscess present.  There was pus within multiple pockets within the right  lower lobe superior segment.  This was debrided.  Portions were sent for pathology and for culture.  Put portions of the pleural lining were also sent for culture and histology.  The entire lung was decorticated.  The most difficult portion was the superior aspect of the hemithorax were the lung was densely adherent to the underside of the chest wall.  The rest of the lung was fairly easily decorticated.  The chest was irrigated and hemostasis was complete.  4 chest tubes were inserted.  These were 28 Blake's brought out through separate stab wounds inferiorly.  The most anterior tube was placed to the apex anteriorly.  The second tube was placed along the paravertebral space.  The third tube was placed through the lung abscess in position posteriorly along the paravertebral space.  The fourth chest tube was positioned posteriorly.  The chest was then closed after complete hemostasis.  #2 Vicryl pericostal sutures were used to approximate the ribs.  The latissimus muscle was closed with #2 Vicryl.  This obtains tissues with 2-0 Vicryl and the skin with skin clips.  All chest tubes were secured with 0 Prolene.  All sponge needle and instrument counts were correct as reported to me at the end of the case.  Sterile dressings were applied and the patient was then extubated and taken to the recovery room in stable condition.   Nestor Lewandowsky, MD

## 2017-04-03 NOTE — Progress Notes (Signed)
* [facility]     Assessment and Plan:  Necrotizing pneumonia complicated by severe right lung empyema, status post tube thoracostomy, minimal/slow improvement.  Intermittent air leak; possible ruptured lung abscess.  History of alcohol abuse. Nicotine abuse with COPD.   --Continue empyema management per thoracic surgery, continue chest tube drainage,planning for OR today for decortication.   --Continue abx.  --Outpatient eval for COPD once recovered.  --Discussed the importance of smoking cessation.     Date: 04/03/2017  MRN# [id number] Kristopher [name] 08-24-1960   Kristopher [name] is a 57 y.o. old male seen in follow up for chief complaint of  Chief Complaint  Patient presents with  . Shortness of Breath     HPI:  Pt is awake, alert, no new complaints. He feels well today. The chest tube continues to have an intermittent air leak on most exhalations.   Pleural fluid culture negative thus far.  Imaging personally reviewed, there is continued presence of complicated pleural fluid in right lung with chest tube present.    Medication:    Current Facility-Administered Medications:  .  0.9 %  sodium chloride infusion, , Intravenous, Continuous, Mody, Sital, MD, Last Rate: 75 mL/hr at 04/03/17 0501 .  acetaminophen (TYLENOL) tablet 650 mg, 650 mg, Oral, Q6H PRN **OR** acetaminophen (TYLENOL) suppository 650 mg, 650 mg, Rectal, Q6H PRN, Dustin Flock, MD .  feeding supplement (ENSURE ENLIVE) (ENSURE ENLIVE) liquid 237 mL, 237 mL, Oral, TID BM, Mody, Sital, MD, 237 mL at 04/02/17 2123 .  folic acid (FOLVITE) tablet 1 mg, 1 mg, Oral, Daily, Dustin Flock, MD, 1 mg at 04/02/17 0946 .  LORazepam (ATIVAN) tablet 1 mg, 1 mg, Oral, Q6H PRN, Wilhelmina Mcardle, MD, 1 mg at 04/02/17 2123 .  morphine 2 MG/ML injection 2 mg, 2 mg, Intravenous, Q4H PRN, Mody, Sital, MD .  multivitamin with minerals tablet 1 tablet, 1 tablet, Oral, Daily, Dustin Flock, MD, 1 tablet at  04/02/17 0946 .  [DISCONTINUED] ondansetron (ZOFRAN) tablet 4 mg, 4 mg, Oral, Q6H PRN **OR** ondansetron (ZOFRAN) injection 4 mg, 4 mg, Intravenous, Q6H PRN, Dustin Flock, MD .  oxyCODONE-acetaminophen (PERCOCET) 7.5-325 MG per tablet 1-2 tablet, 1-2 tablet, Oral, Q4H PRN, Mikael Spray, NP, 2 tablet at 04/03/17 0217 .  piperacillin-tazobactam (ZOSYN) IVPB 3.375 g, 3.375 g, Intravenous, Q8H, Dustin Flock, MD, Stopped at 04/03/17 0910 .  senna-docusate (Senokot-S) tablet 1 tablet, 1 tablet, Oral, QHS, Mody, Sital, MD, 1 tablet at 04/02/17 2123 .  thiamine (VITAMIN B-1) tablet 100 mg, 100 mg, Oral, Daily, 100 mg at 04/02/17 0946 **OR** [DISCONTINUED] thiamine (B-1) injection 100 mg, 100 mg, Intravenous, Daily, Dustin Flock, MD   Allergies:  Percocet [oxycodone-acetaminophen]  Review of Systems: Gen:  Denies  fever, sweats. HEENT: Denies blurred vision. Cvc:  No dizziness, chest pain or heaviness Resp:   Denies cough or sputum porduction. Gi: Denies swallowing difficulty, stomach pain. constipation, bowel incontinence Gu:  Denies bladder incontinence, burning urine Ext:   No Joint pain, stiffness. Skin: No skin rash, easy bruising. Endoc:  No polyuria, polydipsia. Psych: No depression, insomnia. Other:  All other systems were reviewed and found to be negative other than what is mentioned in the HPI.   Physical Examination:   VS: BP 124/76 (BP Location: Left Arm)   Pulse 86   Temp 98.4 F (36.9 C) (Oral)   Resp 18   Ht 5\' 11"  (1.803 m)   Wt 128 lb 12 oz (58.4 kg)  SpO2 97%   BMI 17.96 kg/m   General Appearance: No distress  Neuro:without focal findings,  speech normal,  HEENT: PERRLA, EOM intact. Pulmonary: continued decreased air entry in right lung.  CardiovascularNormal S1,S2.  No m/r/g.   Abdomen: Benign, Soft, non-tender. Renal:  No costovertebral tenderness  GU:  Not performed at this time. Endoc: No evident thyromegaly, no signs of acromegaly. Skin:    warm, no rash. Extremities: normal, no cyanosis, clubbing.   LABORATORY PANEL:   CBC Recent Labs  Lab 04/02/17 0349  WBC 9.2  HGB 10.6*  HCT 31.5*  PLT 631*   ------------------------------------------------------------------------------------------------------------------  Chemistries  Recent Labs  Lab 03/28/17 1839  03/30/17 0449  04/02/17 0349  NA 127*   < > 127*   < > 130*  K 4.4   < > 4.1   < > 4.4  CL 88*   < > 91*   < > 93*  CO2 26   < > 29   < > 30  GLUCOSE 116*   < > 95   < > 95  BUN 6   < > 6   < > 7  CREATININE 0.45*   < > 0.48*   < > 0.60*  CALCIUM 8.7*   < > 8.4*   < > 8.8*  MG  --   --  1.8  --   --   AST 36  --   --   --   --   ALT 25  --   --   --   --   ALKPHOS 107  --   --   --   --   BILITOT 0.9  --   --   --   --    < > = values in this interval not displayed.   ------------------------------------------------------------------------------------------------------------------  Cardiac Enzymes Recent Labs  Lab 03/28/17 1839  TROPONINI <0.03   ------------------------------------------------------------  RADIOLOGY:   No results found for this or any previous visit. Results for orders placed during the hospital encounter of 03/28/17  DG Chest 2 View   Narrative CLINICAL DATA:  Patient reports SOB onset today. No known heart or lung conditions. Current smoker.  EXAM: CHEST  2 VIEW  COMPARISON:  Chest CT 03/02/2017, chest radiograph 03/04/2017  FINDINGS: Normal cardiac silhouette. There is continued increase in fluid within the RIGHT hemithorax. The RIGHT hemithorax is near completely opacified (95%). This is increased significantly from comparison chest radiograph. There is mild mass effect upon the mediastinum.  IMPRESSION: 1. Increasing pleural fluid in the RIGHT hemithorax with near complete opacification of the RIGHT hemithorax. This is increased significantly from 03/04/2017 where patient was found to have pneumonia on chest  CT. 2. Mild shift of the mediastinum by the increasing pleural fluid. 3. Consider thoracentesis with cytology to evaluate for pulmonary malignancy.   Electronically Signed   By: Suzy Bouchard M.D.   On: 03/28/2017 19:11    ------------------------------------------------------------------------------------------------------------------  Thank  you for allowing [facility] to assist in the care of your patient. Our recommendations are noted above.  Please contact us if we can be of further service.   Marda Stalker, MD.  [facility] Office Number: [phone]  Patricia Pesa, M.D.  Merton Border, M.D  04/03/2017

## 2017-04-04 ENCOUNTER — Inpatient Hospital Stay: Payer: 59

## 2017-04-04 LAB — BASIC METABOLIC PANEL
ANION GAP: 7 (ref 5–15)
BUN: 8 mg/dL (ref 6–20)
CHLORIDE: 92 mmol/L — AB (ref 101–111)
CO2: 29 mmol/L (ref 22–32)
Calcium: 8.6 mg/dL — ABNORMAL LOW (ref 8.9–10.3)
Creatinine, Ser: 0.48 mg/dL — ABNORMAL LOW (ref 0.61–1.24)
GFR calc Af Amer: >60 mL/min (ref >60)
GLUCOSE: 139 mg/dL — AB (ref 65–99)
POTASSIUM: 4.5 mmol/L (ref 3.5–5.1)
Sodium: 128 mmol/L — ABNORMAL LOW (ref 135–145)

## 2017-04-04 LAB — TYPE AND SCREEN
ABO/RH(D): B POS
Antibody Screen: NEGATIVE
UNIT DIVISION: 0
UNIT DIVISION: 0
Unit division: 0
Unit division: 0

## 2017-04-04 LAB — CBC WITH DIFFERENTIAL/PLATELET
BASOS ABS: 0.1 10*3/uL (ref 0–0.1)
Basophils Relative: 1 %
EOS PCT: 0 %
Eosinophils Absolute: 0 10*3/uL (ref 0–0.7)
HEMATOCRIT: 30.3 % — AB (ref 40.0–52.0)
HEMOGLOBIN: 10.2 g/dL — AB (ref 13.0–18.0)
LYMPHS ABS: 1.1 10*3/uL (ref 1.0–3.6)
LYMPHS PCT: 9 %
MCH: 30.6 pg (ref 26.0–34.0)
MCHC: 33.6 g/dL (ref 32.0–36.0)
MCV: 91 fL (ref 80.0–100.0)
Monocytes Absolute: 1.6 10*3/uL — ABNORMAL HIGH (ref 0.2–1.0)
Monocytes Relative: 13 %
NEUTROS ABS: 9.9 10*3/uL — AB (ref 1.4–6.5)
NEUTROS PCT: 77 %
PLATELETS: 693 10*3/uL — AB (ref 150–440)
RBC: 3.33 MIL/uL — AB (ref 4.40–5.90)
RDW: 14.7 % — ABNORMAL HIGH (ref 11.5–14.5)
WBC: 12.8 10*3/uL — AB (ref 3.8–10.6)

## 2017-04-04 LAB — BPAM RBC
BLOOD PRODUCT EXPIRATION DATE: 201902072359
BLOOD PRODUCT EXPIRATION DATE: 201902232359
Blood Product Expiration Date: 201902192359
Blood Product Expiration Date: 201902232359
ISSUE DATE / TIME: 201901221148
ISSUE DATE / TIME: 201901221356
UNIT TYPE AND RH: 5100
Unit Type and Rh: 5100
Unit Type and Rh: 7300
Unit Type and Rh: 7300

## 2017-04-04 LAB — ACID FAST SMEAR (AFB)

## 2017-04-04 LAB — ACID FAST SMEAR (AFB, MYCOBACTERIA): Acid Fast Smear: NEGATIVE

## 2017-04-04 MED ORDER — ALBUTEROL SULFATE (2.5 MG/3ML) 0.083% IN NEBU
2.5000 mg | INHALATION_SOLUTION | Freq: Four times a day (QID) | RESPIRATORY_TRACT | Status: DC
Start: 2017-04-04 — End: 2017-04-06
  Administered 2017-04-04 – 2017-04-06 (×6): 2.5 mg via RESPIRATORY_TRACT
  Filled 2017-04-04 (×7): qty 3

## 2017-04-04 MED ORDER — CHLORHEXIDINE GLUCONATE 0.12 % MT SOLN
15.0000 mL | Freq: Two times a day (BID) | OROMUCOSAL | Status: DC
  Administered 2017-04-04 – 2017-04-07 (×7): 15 mL via OROMUCOSAL
  Filled 2017-04-04 (×8): qty 15

## 2017-04-04 MED ORDER — DEXTROSE-NACL 5-0.9 % IV SOLN
INTRAVENOUS | Status: DC
  Administered 2017-04-04 – 2017-04-06 (×4): via INTRAVENOUS

## 2017-04-04 MED ORDER — ADULT MULTIVITAMIN W/MINERALS CH
1.0000 | ORAL_TABLET | Freq: Every day | ORAL | Status: DC
  Administered 2017-04-04 – 2017-04-11 (×8): 1 via ORAL
  Filled 2017-04-04 (×8): qty 1

## 2017-04-04 MED ORDER — MORPHINE SULFATE (PF) 2 MG/ML IV SOLN
2.0000 mg | INTRAVENOUS | Status: DC | PRN
  Administered 2017-04-05 (×2): 2 mg via INTRAVENOUS
  Filled 2017-04-04 (×2): qty 1

## 2017-04-04 MED ORDER — ORAL CARE MOUTH RINSE: 15.0000 mL | Freq: Two times a day (BID) | OROMUCOSAL | Status: DC

## 2017-04-04 MED ORDER — OXYCODONE-ACETAMINOPHEN 7.5-325 MG PO TABS
1.0000 | ORAL_TABLET | Freq: Four times a day (QID) | ORAL | Status: DC | PRN
  Administered 2017-04-04 – 2017-04-06 (×7): 2 via ORAL
  Filled 2017-04-04 (×7): qty 2

## 2017-04-04 MED ORDER — SODIUM CHLORIDE 0.9 % IV SOLN: INTRAVENOUS | Status: DC

## 2017-04-04 NOTE — Progress Notes (Signed)
Pt has remained alert and oriented with c/o moderate/severe deep, achy pain to RLL surgical chest tube insertion site. Pain was moderatly controlled with tramadol 100mg  & morphine 4mg -> new orders for percocet and morphine for pain management despite itching allergy- doc and pt aware. Pt has remained on RA, SpO2 >90%, tachypnea to 30s. Lung sounds absent to RLL, diminished in RUL, and clear/diminished in LUL & LLL. Pt has a strong productive cough.Chest tube drainage bright red and is measured q4. NSR on cardiac monitor, BP/HR WNL. Pt has a poor appetite-regular diet ordered-no nausea. Pt has refused repositioning despite his supine position, pt states he is able to slightly reposition himself and that it is sufficient.

## 2017-04-04 NOTE — Care Management (Signed)
Late entry Assessment from 04/02/17  Patient admitted for PNX and empyema.  At time of assessment patient had 1 chest tube in place.  Patient is now s/p thoracotomy and decortication.  Patient lives at home alone.  Patient states that his sister lives locally for support.  At baseline patient is independent and employed.  Patient does not have PCP.  A hospital follow up appointment is scheduled with Elray Mcgregor at [facility] on 04/15/17 at 9:15 am.  Patient has inquired about applying for disability.  RNCM informed patient that he would have to follow up with Department of Social Services for eligibly/to apply for disability.  MD informed RNCM that there may be some issues with transportation at discharge.  Patient denies any issues with transportation.  Patient states that he has a working car, and plans to transport himself to follow up.  RNCM following for discharge planning.

## 2017-04-04 NOTE — Anesthesia Postprocedure Evaluation (Signed)
Anesthesia Post Note  Patient: Kristopher [name]  Procedure(s) Performed: PREOP BRONCHOSCOPY (N/A ) THORACOTOMY MAJOR (Right ) DECORTICATION (N/A )  Patient location during evaluation: ICU Anesthesia Type: General Level of consciousness: awake Pain management: pain level controlled Vital Signs Assessment: post-procedure vital signs reviewed and stable Respiratory status: spontaneous breathing Cardiovascular status: blood pressure returned to baseline Postop Assessment: no headache, no backache and no apparent nausea or vomiting Anesthetic complications: no     Last Vitals:  Vitals:   04/04/17 0630 04/04/17 0800  BP: 106/74 94/78  Pulse: 94 94  Resp: (!) 27 (!) 35  Temp:  36.6 C  SpO2: 93% 99%    Last Pain:  Vitals:   04/04/17 0800  TempSrc: Axillary  PainSc:                  [name]

## 2017-04-04 NOTE — Progress Notes (Signed)
[facility] [place] NAME: Nat Lowenthal    MR#:  [id number]  DATE OF BIRTH:  Sep 08, 1960  SUBJECTIVE:   Waiting for surgery today. No new complains. SOB better, chest tube in place. S/p surgery.  REVIEW OF SYSTEMS:    Review of Systems  Constitutional: Negative for fever, chills weight loss Positive for generalized weakness and fatigue HENT: Negative for ear pain, nosebleeds, congestion, facial swelling, rhinorrhea, neck pain, neck stiffness and ear discharge.   Respiratory: Improved shortness of breath denies cough Cardiovascular: Negative for chest pain, palpitations and leg swelling.  Gastrointestinal: Negative for heartburn, abdominal pain, vomiting, diarrhea or consitpation Genitourinary: Negative for dysuria, urgency, frequency, hematuria Musculoskeletal: Negative for back pain or joint pain Neurological: Negative for dizziness, seizures, syncope, focal weakness,  numbness and headaches.  Hematological: Does not bruise/bleed easily.  Psychiatric/Behavioral: Negative for hallucinations, confusion, dysphoric mood  Tolerating Diet: yes   DRUG ALLERGIES:   Allergies  Allergen Reactions  . Percocet [Oxycodone-Acetaminophen] Itching    VITALS:  Blood pressure 107/71, pulse 97, temperature 98.5 F (36.9 C), temperature source Axillary, resp. rate (!) 31, height 5\' 11"  (1.803 m), weight 58.4 kg (128 lb 12 oz), SpO2 95 %.  PHYSICAL EXAMINATION:  Constitutional: Appears thin. No distress. HENT: Normocephalic. Marland Kitchen Oropharynx is clear and moist.  Eyes: Conjunctivae and EOM are normal. PERRLA, no scleral icterus.  Neck: Normal ROM. Neck supple. No JVD. No tracheal deviation. CVS: RRR, S1/S2 +, no murmurs, no gallops, no carotid bruit.  Pulmonary: Chest tube placed with decreased breath sounds right upper lobe . Abdominal: Soft. BS +,  no distension, tenderness, rebound or guarding.  Musculoskeletal: Normal range of motion. No edema and no  tenderness.  Neuro: Alert. CN 2-12 grossly intact. No focal deficits. Skin: Skin is warm and dry. No rash noted. Psychiatric: Normal mood and affect.    LABORATORY PANEL:   CBC Recent Labs  Lab 04/04/17 0702  WBC 12.8*  HGB 10.2*  HCT 30.3*  PLT 693*   ------------------------------------------------------------------------------------------------------------------  Chemistries  Recent Labs  Lab 03/28/17 1839  03/30/17 0449  04/04/17 0702  NA 127*   < > 127*   < > 128*  K 4.4   < > 4.1   < > 4.5  CL 88*   < > 91*   < > 92*  CO2 26   < > 29   < > 29  GLUCOSE 116*   < > 95   < > 139*  BUN 6   < > 6   < > 8  CREATININE 0.45*   < > 0.48*   < > 0.48*  CALCIUM 8.7*   < > 8.4*   < > 8.6*  MG  --   --  1.8  --   --   AST 36  --   --   --   --   ALT 25  --   --   --   --   ALKPHOS 107  --   --   --   --   BILITOT 0.9  --   --   --   --    < > = values in this interval not displayed.   ------------------------------------------------------------------------------------------------------------------  Cardiac Enzymes Recent Labs  Lab 03/28/17 1839  TROPONINI <0.03   ------------------------------------------------------------------------------------------------------------------  RADIOLOGY:  Dg Chest 1 View  Result Date: 04/03/2017 CLINICAL DATA:  Dyspnea. EXAM: CHEST 1 VIEW COMPARISON:  04/02/2017 FINDINGS: Right chest tube  again noted. No pneumothorax. Pleural-parenchymal opacity at the right base is similar with persistent right pleural effusion. Volume loss right hemithorax similar to prior. Left lung clear. The cardiopericardial silhouette is within normal limits for size. The visualized bony structures of the thorax are intact. IMPRESSION: Stable exam with right chest tube in place. Basilar pleural-parenchymal opacity with pleural effusion. Electronically Signed   By: Misty Stanley M.D.   On: 04/03/2017 12:12   Dg Chest Port 1 View  Result Date: 04/04/2017 CLINICAL  DATA:  Respiratory failure. EXAM: PORTABLE CHEST 1 VIEW COMPARISON:  04/03/2017. FINDINGS: Multiple right chest tubes again noted in stable position. Tiny right-sided pneumothorax is unchanged. Mild right chest wall subcutaneous emphysema. Small right pleural effusion again noted mild atelectatic changes right lung base. Left lung is clear. Stable cardiomegaly with normal pulmonary vascularity. Chest appears stable from prior exam. IMPRESSION: 1. Chest appears stable from prior exam. Multiple right chest tubes again noted with small right pneumothorax and mild right chest wall subcutaneous emphysema small right pleural effusion again noted. Atelectatic changes in the right lung base again noted. 2.  Stable cardiomegaly. Electronically Signed   By: Marcello Moores  Register   On: 04/04/2017 08:53   Dg Chest Port 1 View  Result Date: 04/03/2017 CLINICAL DATA:  Status post bronchoscopy and thoracotomy EXAM: PORTABLE CHEST 1 VIEW COMPARISON:  04/03/2017, 04/02/2017, 03/30/2017 chest CT FINDINGS: Interim placement of multiple right chest drainage catheters, 3 of the tips overlie the right lung apex, single tip projects over the right upper quadrant. Cutaneous staples and small amount of subcutaneous emphysema over the right lateral chest wall. Decreased right pleural disease. Small right lateral pneumothorax. No midline shift. Left lung grossly clear. Stable cardiomediastinal silhouette. Improved aeration of the right lung base. IMPRESSION: Placement of multiple right-sided chest drainage catheters with decreased pleural disease. Trace right lateral pneumothorax. Improved aeration of right lung base with residual hazy infiltrate in the region. Cutaneous staples over the right lower lateral chest with small amount of subcutaneous emphysema. Electronically Signed   By: Donavan Foil M.D.   On: 04/03/2017 20:51     ASSESSMENT AND PLAN:   57 year old male with EtOH abuse who presents with shortness of breath and found to  have large empyema and CT scan  1. Tension Empyema/right lateral apex pneumothorax with reexpansion of right upper lobe: Patient is status post chest tube placement.  require decortication for empyema - 04/03/17.  Appreciate Dr. Genevive Bi consult.  Initial cultures thus far are negative. Follow-up on final cultures from empyema  Toradol for pain  Continue antibiotic for now as per Pulm and surgery team.  2. Hyponatremia: This is due to empyema and dehydration Follow sodium levels  3. EtOH abuse: Uneventful detox so far  Continue CIWA protocol  4. Severe protein calorie malnutrition: Dietary consultation requested and appreciated. Continue dietary supplement  5. One out of 2 gram-positive cocci staph species which is contaminant  6. Thrombocytosis    Likely reactive to infections.  D/w dr Genevive Bi and pulmonary.  Management plans discussed with the patient and he is in agreement.  CODE STATUS: FULL  TOTAL TIME TAKING CARE OF THIS PATIENT: 25 minutes.   PT recommends home with home health upon discharge.  POSSIBLE D/C 2-3 days, DEPENDING ON CLINICAL CONDITION.   Vaughan Basta M.D on 04/04/2017 at 5:54 PM  Between 7am to 6pm - Pager - [phone] After 6pm go to [email or web address] - password [email or web address] [facility]  Office  [phone]  CC:  Primary care physician; Patient, No Pcp Per  Note: This dictation was prepared with Dragon dictation along with smaller phrase technology. Any transcriptional errors that result from this process are unintentional.

## 2017-04-04 NOTE — Progress Notes (Signed)
[facility] Progess Note    SYNOPSIS   57 M recently treated at [facility] for RLL necrotizing pneumonia effusion, empyema. Patient is now status post preoperative bronchoscopy with right thoracotomy and decortication.   ASSESSMENT/PLAN   Necrotizing pneumonia with empyema, status post decortication. Multiple chest tubes in place. Pending culture of the pleural space ID and sensitivity. Presently being treated empirically with Zosyn. No air leaks appreciated on exam today. We'll follow along, Dr. Genevive Bi to decide when chest tubes will be removed. Pending ID of the pleural space will adjust antibiotics appropriately.  INTAKE / OUTPUT:  Intake/Output Summary (Last 24 hours) at 04/04/2017 0847 Last data filed at 04/04/2017 0525 Gross per 24 hour  Intake 3819.25 ml  Output 3285 ml  Net 534.25 ml     Name: Kristopher [name] MRN: [id number] DOB: Jul 08, 1960    ADMISSION DATE:  03/28/2017  SUBJECTIVE:   Patient is resting comfortably, doing well. Status post decortication and thoracotomy. Multiple chest tubes with Y-connection on the right..   Review of Systems:  Constitutional: Feels well. Cardiovascular: No chest pain.  Pulmonary: Denies dyspnea.   10 point review of systems was negative  other than what is documented in the HPI.   VITAL SIGNS: Temp:  [97.6 F (36.4 C)-100.4 F (38 C)] 97.9 F (36.6 C) (01/25 0800) Pulse Rate:  [81-101] 94 (01/25 0800) Resp:  [18-40] 35 (01/25 0800) BP: (85-139)/(59-90) 94/78 (01/25 0800) SpO2:  [91 %-100 %] 99 % (01/25 0800) FiO2 (%):  [28 %] 28 % (01/24 2021)   PHYSICAL EXAMINATION: Physical Examination:   VS: BP 94/78 (BP Location: Left Arm)   Pulse 94   Temp 97.9 F (36.6 C) (Axillary)   Resp (!) 35   Ht 5\' 11"  (1.803 m)   Wt 128 lb 12 oz (58.4 kg)   SpO2 99%   BMI 17.96 kg/m   General Appearance: No distress  Neuro:without focal findings, mental status normal. HEENT: PERRLA, EOM intact. Pulmonary: Diminished  breath sounds noted on the right lung base with aeration appreciated in the right upper lung zone. Multiple chest tubes appreciated on the right. Full aeration on the left lung Chest tube does not reveal any air leak in for VAC. Chest x-ray reveals a small rim of pneumothorax with a small amount of subcutaneous air. CardiovascularNormal S1,S2.  No m/r/g.   Abdomen: Benign, Soft, non-tender. Skin:   warm, no rashes, no ecchymosis  Extremities: normal, no cyanosis, clubbing.    LABORATORY PANEL:   CBC Recent Labs  Lab 04/04/17 0702  WBC 12.8*  HGB 10.2*  HCT 30.3*  PLT 693*    Chemistries  Recent Labs  Lab 03/28/17 1839  03/30/17 0449  04/04/17 0702  NA 127*   < > 127*   < > 128*  K 4.4   < > 4.1   < > 4.5  CL 88*   < > 91*   < > 92*  CO2 26   < > 29   < > 29  GLUCOSE 116*   < > 95   < > 139*  BUN 6   < > 6   < > 8  CREATININE 0.45*   < > 0.48*   < > 0.48*  CALCIUM 8.7*   < > 8.4*   < > 8.6*  MG  --   --  1.8  --   --   PHOS  --   --  4.1  --   --  AST 36  --   --   --   --   ALT 25  --   --   --   --   ALKPHOS 107  --   --   --   --   BILITOT 0.9  --   --   --   --    < > = values in this interval not displayed.    Recent Labs  Lab 03/29/17 0932 04/03/17 1952  GLUCAP 143* 99   No results for input(s): PHART, PCO2ART, PO2ART in the last 168 hours. Recent Labs  Lab 03/28/17 1839  AST 36  ALT 25  ALKPHOS 107  BILITOT 0.9  ALBUMIN 2.7*    Cardiac Enzymes Recent Labs  Lab 03/28/17 1839  TROPONINI <0.03    RADIOLOGY:  Dg Chest 1 View  Result Date: 04/03/2017 CLINICAL DATA:  Dyspnea. EXAM: CHEST 1 VIEW COMPARISON:  04/02/2017 FINDINGS: Right chest tube again noted. No pneumothorax. Pleural-parenchymal opacity at the right base is similar with persistent right pleural effusion. Volume loss right hemithorax similar to prior. Left lung clear. The cardiopericardial silhouette is within normal limits for size. The visualized bony structures of the thorax  are intact. IMPRESSION: Stable exam with right chest tube in place. Basilar pleural-parenchymal opacity with pleural effusion. Electronically Signed   By: Misty Stanley M.D.   On: 04/03/2017 12:12   Dg Chest 1 View  Result Date: 04/02/2017 CLINICAL DATA:  Empyema.  Chest tube in place. EXAM: CHEST 1 VIEW COMPARISON:  04/01/2017 and 03/31/2017 and CT scan dated 03/30/2017 FINDINGS: Right chest tube remains in place. There is persistent loculated air and fluid at the right lung base primarily laterally. There is slightly improved aeration at the right lung base since the prior study. Left lung is clear. Heart size and vascularity are normal. Bones are normal. IMPRESSION: 1. Slightly improved aeration at the right lung base laterally. 2. No other change. Electronically Signed   By: Lorriane Shire M.D.   On: 04/02/2017 14:38   Dg Chest Port 1 View  Result Date: 04/03/2017 CLINICAL DATA:  Status post bronchoscopy and thoracotomy EXAM: PORTABLE CHEST 1 VIEW COMPARISON:  04/03/2017, 04/02/2017, 03/30/2017 chest CT FINDINGS: Interim placement of multiple right chest drainage catheters, 3 of the tips overlie the right lung apex, single tip projects over the right upper quadrant. Cutaneous staples and small amount of subcutaneous emphysema over the right lateral chest wall. Decreased right pleural disease. Small right lateral pneumothorax. No midline shift. Left lung grossly clear. Stable cardiomediastinal silhouette. Improved aeration of the right lung base. IMPRESSION: Placement of multiple right-sided chest drainage catheters with decreased pleural disease. Trace right lateral pneumothorax. Improved aeration of right lung base with residual hazy infiltrate in the region. Cutaneous staples over the right lower lateral chest with small amount of subcutaneous emphysema. Electronically Signed   By: Donavan Foil M.D.   On: 04/03/2017 20:51    Hermelinda Dellen, DO  1/25/2019Patient ID: [name], male   DOB:  1960/03/25, 57 y.o.   MRN: [id number]

## 2017-04-04 NOTE — Progress Notes (Signed)
[facility] NAME: Kristopher [name]    MR#:  [id number]  DATE OF BIRTH:  [birth year]  SUBJECTIVE:   Waiting for surgery today. No new complains. SOB better, chest tube in place.  REVIEW OF SYSTEMS:    Review of Systems  Constitutional: Negative for fever, chills weight loss Positive for generalized weakness and fatigue HENT: Negative for ear pain, nosebleeds, congestion, facial swelling, rhinorrhea, neck pain, neck stiffness and ear discharge.   Respiratory: Improved shortness of breath denies cough Cardiovascular: Negative for chest pain, palpitations and leg swelling.  Gastrointestinal: Negative for heartburn, abdominal pain, vomiting, diarrhea or consitpation Genitourinary: Negative for dysuria, urgency, frequency, hematuria Musculoskeletal: Negative for back pain or joint pain Neurological: Negative for dizziness, seizures, syncope, focal weakness,  numbness and headaches.  Hematological: Does not bruise/bleed easily.  Psychiatric/Behavioral: Negative for hallucinations, confusion, dysphoric mood   Tolerating Diet: yes   DRUG ALLERGIES:   Allergies  Allergen Reactions  . Percocet [Oxycodone-Acetaminophen] Itching    VITALS:  Blood pressure 106/74, pulse 94, temperature 98.1 F (36.7 C), temperature source Axillary, resp. rate (!) 27, height 5\' 11"  (1.803 m), weight 58.4 kg (128 lb 12 oz), SpO2 93 %.  PHYSICAL EXAMINATION:  Constitutional: Appears thin. No distress. HENT: Normocephalic. Marland Kitchen Oropharynx is clear and moist.  Eyes: Conjunctivae and EOM are normal. PERRLA, no scleral icterus.  Neck: Normal ROM. Neck supple. No JVD. No tracheal deviation. CVS: RRR, S1/S2 +, no murmurs, no gallops, no carotid bruit.  Pulmonary: Chest tube placed with decreased breath sounds right upper lobe . Abdominal: Soft. BS +,  no distension, tenderness, rebound or guarding.  Musculoskeletal: Normal range of motion. No edema and no tenderness.   Neuro: Alert. CN 2-12 grossly intact. No focal deficits. Skin: Skin is warm and dry. No rash noted. Psychiatric: Normal mood and affect.    LABORATORY PANEL:   CBC Recent Labs  Lab 04/04/17 0702  WBC 12.8*  HGB 10.2*  HCT 30.3*  PLT 693*   ------------------------------------------------------------------------------------------------------------------  Chemistries  Recent Labs  Lab 03/28/17 1839  03/30/17 0449  04/04/17 0702  NA 127*   < > 127*   < > 128*  K 4.4   < > 4.1   < > 4.5  CL 88*   < > 91*   < > 92*  CO2 26   < > 29   < > 29  GLUCOSE 116*   < > 95   < > 139*  BUN 6   < > 6   < > 8  CREATININE 0.45*   < > 0.48*   < > 0.48*  CALCIUM 8.7*   < > 8.4*   < > 8.6*  MG  --   --  1.8  --   --   AST 36  --   --   --   --   ALT 25  --   --   --   --   ALKPHOS 107  --   --   --   --   BILITOT 0.9  --   --   --   --    < > = values in this interval not displayed.   ------------------------------------------------------------------------------------------------------------------  Cardiac Enzymes Recent Labs  Lab 03/28/17 1839  TROPONINI <0.03   ------------------------------------------------------------------------------------------------------------------  RADIOLOGY:  Dg Chest 1 View  Result Date: 04/03/2017 CLINICAL DATA:  Dyspnea. EXAM: CHEST 1 VIEW COMPARISON:  04/02/2017 FINDINGS: Right chest tube again  noted. No pneumothorax. Pleural-parenchymal opacity at the right base is similar with persistent right pleural effusion. Volume loss right hemithorax similar to prior. Left lung clear. The cardiopericardial silhouette is within normal limits for size. The visualized bony structures of the thorax are intact. IMPRESSION: Stable exam with right chest tube in place. Basilar pleural-parenchymal opacity with pleural effusion. Electronically Signed   By: Misty Stanley M.D.   On: 04/03/2017 12:12   Dg Chest 1 View  Result Date: 04/02/2017 CLINICAL DATA:  Empyema.   Chest tube in place. EXAM: CHEST 1 VIEW COMPARISON:  04/01/2017 and 03/31/2017 and CT scan dated 03/30/2017 FINDINGS: Right chest tube remains in place. There is persistent loculated air and fluid at the right lung base primarily laterally. There is slightly improved aeration at the right lung base since the prior study. Left lung is clear. Heart size and vascularity are normal. Bones are normal. IMPRESSION: 1. Slightly improved aeration at the right lung base laterally. 2. No other change. Electronically Signed   By: Lorriane Shire M.D.   On: 04/02/2017 14:38   Dg Chest Port 1 View  Result Date: 04/03/2017 CLINICAL DATA:  Status post bronchoscopy and thoracotomy EXAM: PORTABLE CHEST 1 VIEW COMPARISON:  04/03/2017, 04/02/2017, 03/30/2017 chest CT FINDINGS: Interim placement of multiple right chest drainage catheters, 3 of the tips overlie the right lung apex, single tip projects over the right upper quadrant. Cutaneous staples and small amount of subcutaneous emphysema over the right lateral chest wall. Decreased right pleural disease. Small right lateral pneumothorax. No midline shift. Left lung grossly clear. Stable cardiomediastinal silhouette. Improved aeration of the right lung base. IMPRESSION: Placement of multiple right-sided chest drainage catheters with decreased pleural disease. Trace right lateral pneumothorax. Improved aeration of right lung base with residual hazy infiltrate in the region. Cutaneous staples over the right lower lateral chest with small amount of subcutaneous emphysema. Electronically Signed   By: Donavan Foil M.D.   On: 04/03/2017 20:51     ASSESSMENT AND PLAN:   57 year old male with EtOH abuse who presents with shortness of breath and found to have large empyema and CT scan  1. Tension Empyema/right lateral apex pneumothorax with reexpansion of right upper lobe: Patient is status post chest tube placement.  require decortication for empyema - later today.  Appreciate  Dr. Genevive Bi consult.  Initial cultures thus far are negative. Follow-up on final cultures from empyema  Toradol for pain  Continue antibiotic for now.  may d/c soon after Surgery, if OK with Pulm and surgery team.  2. Hyponatremia: This is due to empyema and dehydration Follow sodium levels  3. EtOH abuse: Uneventful detox so far  Continue CIWA protocol  4. Severe protein calorie malnutrition: Dietary consultation requested and appreciated. Continue dietary supplement  5. One out of 2 gram-positive cocci staph species which is contaminant  6. Thrombocytosis    Likely reactive to infections.  D/w dr Genevive Bi and pulmonary.  Management plans discussed with the patient and he is in agreement.  CODE STATUS: FULL  TOTAL TIME TAKING CARE OF THIS PATIENT: 25 minutes.   PT recommends home with home health upon discharge.  POSSIBLE D/C 2-3 days, DEPENDING ON CLINICAL CONDITION.   Vaughan Basta M.D on 04/04/2017 at 8:24 AM  Between 7am to 6pm - Pager - [phone] After 6pm go to [email or web address] - password [email or web address] [facility]  Office  [phone]  CC: Primary care physician; Patient, No Pcp Per  Note: This dictation was prepared  with Dragon dictation along with smaller phrase technology. Any transcriptional errors that result from this process are unintentional.

## 2017-04-05 ENCOUNTER — Inpatient Hospital Stay: Payer: 59

## 2017-04-05 DIAGNOSIS — J869 Pyothorax without fistula: Secondary | ICD-10-CM

## 2017-04-05 LAB — BASIC METABOLIC PANEL
Anion gap: 7 (ref 5–15)
BUN: <5 mg/dL — ABNORMAL LOW (ref 6–20)
CALCIUM: 8.4 mg/dL — AB (ref 8.9–10.3)
CHLORIDE: 95 mmol/L — AB (ref 101–111)
CO2: 30 mmol/L (ref 22–32)
CREATININE: 0.34 mg/dL — AB (ref 0.61–1.24)
GFR calc Af Amer: >60 mL/min (ref >60)
GFR calc non Af Amer: >60 mL/min (ref >60)
GLUCOSE: 128 mg/dL — AB (ref 65–99)
Potassium: 3.9 mmol/L (ref 3.5–5.1)
Sodium: 132 mmol/L — ABNORMAL LOW (ref 135–145)

## 2017-04-05 LAB — CBC
HEMATOCRIT: 27.4 % — AB (ref 40.0–52.0)
Hemoglobin: 9.5 g/dL — ABNORMAL LOW (ref 13.0–18.0)
MCH: 31.4 pg (ref 26.0–34.0)
MCHC: 34.7 g/dL (ref 32.0–36.0)
MCV: 90.4 fL (ref 80.0–100.0)
Platelets: 625 10*3/uL — ABNORMAL HIGH (ref 150–440)
RBC: 3.03 MIL/uL — ABNORMAL LOW (ref 4.40–5.90)
RDW: 14.3 % (ref 11.5–14.5)
WBC: 10.5 10*3/uL (ref 3.8–10.6)

## 2017-04-05 LAB — PHOSPHORUS: Phosphorus: 3.5 mg/dL (ref 2.5–4.6)

## 2017-04-05 LAB — MAGNESIUM: Magnesium: 1.8 mg/dL (ref 1.7–2.4)

## 2017-04-05 MED ORDER — POLYETHYLENE GLYCOL 3350 17 G PO PACK: 17.0000 g | PACK | Freq: Every day | ORAL | Status: DC

## 2017-04-05 MED ORDER — ENSURE ENLIVE PO LIQD
237.0000 mL | Freq: Four times a day (QID) | ORAL | Status: DC
  Administered 2017-04-05 – 2017-04-07 (×5): 237 mL via ORAL

## 2017-04-05 MED ORDER — POLYETHYLENE GLYCOL 3350 17 G PO PACK
17.0000 g | PACK | Freq: Every day | ORAL | Status: DC
  Administered 2017-04-05 – 2017-04-11 (×7): 17 g via ORAL
  Filled 2017-04-05 (×7): qty 1

## 2017-04-05 MED ORDER — MAGNESIUM HYDROXIDE 400 MG/5ML PO SUSP
30.0000 mL | Freq: Every day | ORAL | Status: DC | PRN
  Filled 2017-04-05 (×2): qty 30

## 2017-04-05 NOTE — Progress Notes (Signed)
[place] [facility] Progess Note    SYNOPSIS   17 M recently treated at [facility] for RLL necrotizing pneumonia effusion, empyema. Patient is now status post preoperative bronchoscopy with right thoracotomy and decortication.   ASSESSMENT/PLAN   Necrotizing pneumonia with empyema, status post decortication. Multiple chest tubes in place. Pending culture of the pleural space ID and sensitivity. Presently being treated empirically with Zosyn. Gram-positive cocci in pairs noted pending identification speciation and sensitivity. Presently on Zosyn with clinical improvement, we'll hold on adding vancomycin pending results. No air leaks appreciated on exam today. Will obtain chest x-ray this morning   Hyponatremia. Saline resuscitation  Anemia. No evidence of active bleeding, minimal drainage and chest tube  INTAKE / OUTPUT:  Intake/Output Summary (Last 24 hours) at 04/05/2017 0855 Last data filed at 04/05/2017 0600 Gross per 24 hour  Intake 2940.75 ml  Output 1285 ml  Net 1655.75 ml     Name: Kristopher [name] MRN: [id number] DOB: 15-Mar-1960    ADMISSION DATE:  03/28/2017  SUBJECTIVE:   Patient is resting comfortably, doing well. Status post decortication and thoracotomy. Multiple chest tubes with Y-connection on the right.Marland Kitchen   VITAL SIGNS: Temp:  [98.5 F (36.9 C)-99.3 F (37.4 C)] 99.2 F (37.3 C) (01/26 0400) Pulse Rate:  [83-99] 85 (01/26 0400) Resp:  [15-38] 35 (01/26 0400) BP: (102-124)/(65-91) 124/85 (01/26 0400) SpO2:  [94 %-100 %] 98 % (01/26 0829)   PHYSICAL EXAMINATION: Physical Examination:   VS: BP 124/85 (BP Location: Left Arm)   Pulse 85   Temp 99.2 F (37.3 C) (Oral)   Resp (!) 35   Ht 5\' 11"  (1.803 m)   Wt 128 lb 12 oz (58.4 kg)   SpO2 98%   BMI 17.96 kg/m   General Appearance: No distress  Neuro:without focal findings, mental status normal. HEENT: PERRLA, EOM intact. Pulmonary: Diminished breath sounds noted on the right lung base with  aeration appreciated in the right upper lung zone. Multiple chest tubes appreciated on the right. Full aeration on the left lung Chest tube does not reveal any air leak in for VAC. CardiovascularNormal S1,S2.  No m/r/g.   Abdomen: Benign, Soft, non-tender. Skin:   warm, no rashes, no ecchymosis  Extremities: normal, no cyanosis, clubbing.    LABORATORY PANEL:   CBC Recent Labs  Lab 04/05/17 0453  WBC 10.5  HGB 9.5*  HCT 27.4*  PLT 625*    Chemistries  Recent Labs  Lab 04/05/17 0453  NA 132*  K 3.9  CL 95*  CO2 30  GLUCOSE 128*  BUN <5*  CREATININE 0.34*  CALCIUM 8.4*  MG 1.8  PHOS 3.5    Recent Labs  Lab 03/29/17 0932 04/03/17 1952  GLUCAP 143* 99   No results for input(s): PHART, PCO2ART, PO2ART in the last 168 hours. No results for input(s): AST, ALT, ALKPHOS, BILITOT, ALBUMIN in the last 168 hours.  Cardiac Enzymes No results for input(s): TROPONINI in the last 168 hours.  RADIOLOGY:  Dg Chest 1 View  Result Date: 04/03/2017 CLINICAL DATA:  Dyspnea. EXAM: CHEST 1 VIEW COMPARISON:  04/02/2017 FINDINGS: Right chest tube again noted. No pneumothorax. Pleural-parenchymal opacity at the right base is similar with persistent right pleural effusion. Volume loss right hemithorax similar to prior. Left lung clear. The cardiopericardial silhouette is within normal limits for size. The visualized bony structures of the thorax are intact. IMPRESSION: Stable exam with right chest tube in place. Basilar pleural-parenchymal opacity with pleural effusion. Electronically Signed  By: Misty Stanley M.D.   On: 04/03/2017 12:12   Dg Chest Port 1 View  Result Date: 04/04/2017 CLINICAL DATA:  Respiratory failure. EXAM: PORTABLE CHEST 1 VIEW COMPARISON:  04/03/2017. FINDINGS: Multiple right chest tubes again noted in stable position. Tiny right-sided pneumothorax is unchanged. Mild right chest wall subcutaneous emphysema. Small right pleural effusion again noted mild atelectatic  changes right lung base. Left lung is clear. Stable cardiomegaly with normal pulmonary vascularity. Chest appears stable from prior exam. IMPRESSION: 1. Chest appears stable from prior exam. Multiple right chest tubes again noted with small right pneumothorax and mild right chest wall subcutaneous emphysema small right pleural effusion again noted. Atelectatic changes in the right lung base again noted. 2.  Stable cardiomegaly. Electronically Signed   By: Marcello Moores  Register   On: 04/04/2017 08:53   Dg Chest Port 1 View  Result Date: 04/03/2017 CLINICAL DATA:  Status post bronchoscopy and thoracotomy EXAM: PORTABLE CHEST 1 VIEW COMPARISON:  04/03/2017, 04/02/2017, 03/30/2017 chest CT FINDINGS: Interim placement of multiple right chest drainage catheters, 3 of the tips overlie the right lung apex, single tip projects over the right upper quadrant. Cutaneous staples and small amount of subcutaneous emphysema over the right lateral chest wall. Decreased right pleural disease. Small right lateral pneumothorax. No midline shift. Left lung grossly clear. Stable cardiomediastinal silhouette. Improved aeration of the right lung base. IMPRESSION: Placement of multiple right-sided chest drainage catheters with decreased pleural disease. Trace right lateral pneumothorax. Improved aeration of right lung base with residual hazy infiltrate in the region. Cutaneous staples over the right lower lateral chest with small amount of subcutaneous emphysema. Electronically Signed   By: Donavan Foil M.D.   On: 04/03/2017 20:51    Hermelinda Dellen, DO  1/26/2019Patient ID: Kristopher [name], male   DOB: September 17, 1960, 57 y.o.   MRN: [id number] Patient ID: Kristopher [name], male   DOB: 1961/03/08, 57 y.o.   MRN: [id number]

## 2017-04-05 NOTE — Progress Notes (Signed)
04/05/2017  Subjective: Patient is 2 Days Post-Op s/p right thoracotomy with decortication of visceral and parietal pleura with intra-pleural drainage of lung abscess.  No acute events overnight.  Patient reports his pain is better controlled today and is using both percocet and morphine for pain.  Chest tubes in place.  Vital signs: Temp:  [98.8 F (37.1 C)-99.3 F (37.4 C)] 98.9 F (37.2 C) (01/26 1200) Pulse Rate:  [83-99] 88 (01/26 1400) Resp:  [15-37] 37 (01/26 1400) BP: (102-124)/(70-85) 114/73 (01/26 1400) SpO2:  [94 %-100 %] 96 % (01/26 1400)   Intake/Output: 01/25 0701 - 01/26 0700 In: 3215.8 [P.O.:1197; I.V.:1918.8; IV Piggyback:100] Out: 4259 [Urine:1190; Chest Tube:185] Last BM Date: 04/02/17(per previous documentation)  Physical Exam: Constitutional: No acute distress Pulm:  Right chest tubes x 4 in place to single pleuravac, to suction.  There is no airleak noticeable.   Labs:  Recent Labs    04/04/17 0702 04/05/17 0453  WBC 12.8* 10.5  HGB 10.2* 9.5*  HCT 30.3* 27.4*  PLT 693* 625*   Recent Labs    04/04/17 0702 04/05/17 0453  NA 128* 132*  K 4.5 3.9  CL 92* 95*  CO2 29 30  GLUCOSE 139* 128*  BUN 8 <5*  CREATININE 0.48* 0.34*  CALCIUM 8.6* 8.4*   No results for input(s): LABPROT, INR in the last 72 hours.  Imaging: Dg Chest Port 1 View  Result Date: 04/05/2017 CLINICAL DATA:  Patient status post right thoracotomy and decortication for a right lower lobe abscess empyema 04/03/2017. EXAM: PORTABLE CHEST 1 VIEW COMPARISON:  Single-view of the chest 04/04/2017. FINDINGS: Four right chest tubes remain in place. Pleural and parenchymal opacities in the right mid and lower lung zones are unchanged. Tiny right pneumothorax is unchanged. The left lung is expanded and clear. Heart size is upper normal. Aortic atherosclerosis is seen. IMPRESSION: Tiny right pneumothorax with chest tubes in place. No change in a small right pleural effusion or pleural  thickening and basilar atelectasis. Electronically Signed   By: Inge Rise M.D.   On: 04/05/2017 10:29    Assessment/Plan: 57 yo male s/p right thoracotomy  --continue chest tubes to suction.   --regular diet, pain control. --may transfer to floor   Melvyn Neth, [place]

## 2017-04-05 NOTE — Plan of Care (Signed)
Patient continues with chest tube.  Minimal drainage noted.  Complaints of pain.  PRN medications given as requested.  Pain remains about 7-8.  No crepitus noted.  Dressing in place.  Will continue to monitor.

## 2017-04-05 NOTE — Progress Notes (Signed)
[facility] [place] NAME: Kristopher [name]    MR#:  [id number]  DATE OF BIRTH:  08/11/1960  SUBJECTIVE:   The patient has no complaints, status posterior chest tube placement, in room air. REVIEW OF SYSTEMS:    Review of Systems  Constitutional: Negative for fever, chills weight loss Positive for generalized weakness and fatigue HENT: Negative for ear pain, nosebleeds, congestion, facial swelling, rhinorrhea, neck pain, neck stiffness and ear discharge.   Respiratory: Improved shortness of breath denies cough Cardiovascular: Negative for chest pain, palpitations and leg swelling.  Gastrointestinal: Negative for heartburn, abdominal pain, vomiting, diarrhea or consitpation Genitourinary: Negative for dysuria, urgency, frequency, hematuria Musculoskeletal: Negative for back pain or joint pain Neurological: Negative for dizziness, seizures, syncope, focal weakness,  numbness and headaches.  Hematological: Does not bruise/bleed easily.  Psychiatric/Behavioral: Negative for hallucinations, confusion, dysphoric mood  Tolerating Diet: yes   DRUG ALLERGIES:   Allergies  Allergen Reactions  . Percocet [Oxycodone-Acetaminophen] Itching    VITALS:  Blood pressure 114/77, pulse 89, temperature 98.9 F (37.2 C), temperature source Oral, resp. rate (!) 25, height 5\' 11"  (1.803 m), weight 128 lb 12 oz (58.4 kg), SpO2 93 %.  PHYSICAL EXAMINATION:  Constitutional: Appears thin. No distress. HENT: Normocephalic. Marland Kitchen Oropharynx is clear and moist.  Eyes: Conjunctivae and EOM are normal. PERRLA, no scleral icterus.  Neck: Normal ROM. Neck supple. No JVD. No tracheal deviation. CVS: RRR, S1/S2 +, no murmurs, no gallops, no carotid bruit.  Pulmonary: Chest tube placed with decreased breath sounds right upper lobe . Abdominal: Soft. BS +,  no distension, tenderness, rebound or guarding.  Musculoskeletal: Normal range of motion. No edema and no tenderness.   Neuro: Alert. CN 2-12 grossly intact. No focal deficits. Skin: Skin is warm and dry. No rash noted. Psychiatric: Normal mood and affect.    LABORATORY PANEL:   CBC Recent Labs  Lab 04/05/17 0453  WBC 10.5  HGB 9.5*  HCT 27.4*  PLT 625*   ------------------------------------------------------------------------------------------------------------------  Chemistries  Recent Labs  Lab 04/05/17 0453  NA 132*  K 3.9  CL 95*  CO2 30  GLUCOSE 128*  BUN <5*  CREATININE 0.34*  CALCIUM 8.4*  MG 1.8   ------------------------------------------------------------------------------------------------------------------  Cardiac Enzymes No results for input(s): TROPONINI in the last 168 hours. ------------------------------------------------------------------------------------------------------------------  RADIOLOGY:  Dg Chest Port 1 View  Result Date: 04/05/2017 CLINICAL DATA:  Patient status post right thoracotomy and decortication for a right lower lobe abscess empyema 04/03/2017. EXAM: PORTABLE CHEST 1 VIEW COMPARISON:  Single-view of the chest 04/04/2017. FINDINGS: Four right chest tubes remain in place. Pleural and parenchymal opacities in the right mid and lower lung zones are unchanged. Tiny right pneumothorax is unchanged. The left lung is expanded and clear. Heart size is upper normal. Aortic atherosclerosis is seen. IMPRESSION: Tiny right pneumothorax with chest tubes in place. No change in a small right pleural effusion or pleural thickening and basilar atelectasis. Electronically Signed   By: Inge Rise M.D.   On: 04/05/2017 10:29   Dg Chest Port 1 View  Result Date: 04/04/2017 CLINICAL DATA:  Respiratory failure. EXAM: PORTABLE CHEST 1 VIEW COMPARISON:  04/03/2017. FINDINGS: Multiple right chest tubes again noted in stable position. Tiny right-sided pneumothorax is unchanged. Mild right chest wall subcutaneous emphysema. Small right pleural effusion again noted mild  atelectatic changes right lung base. Left lung is clear. Stable cardiomegaly with normal pulmonary vascularity. Chest appears stable from prior exam. IMPRESSION: 1. Chest  appears stable from prior exam. Multiple right chest tubes again noted with small right pneumothorax and mild right chest wall subcutaneous emphysema small right pleural effusion again noted. Atelectatic changes in the right lung base again noted. 2.  Stable cardiomegaly. Electronically Signed   By: Marcello Moores  Register   On: 04/04/2017 08:53   Dg Chest Port 1 View  Result Date: 04/03/2017 CLINICAL DATA:  Status post bronchoscopy and thoracotomy EXAM: PORTABLE CHEST 1 VIEW COMPARISON:  04/03/2017, 04/02/2017, 03/30/2017 chest CT FINDINGS: Interim placement of multiple right chest drainage catheters, 3 of the tips overlie the right lung apex, single tip projects over the right upper quadrant. Cutaneous staples and small amount of subcutaneous emphysema over the right lateral chest wall. Decreased right pleural disease. Small right lateral pneumothorax. No midline shift. Left lung grossly clear. Stable cardiomediastinal silhouette. Improved aeration of the right lung base. IMPRESSION: Placement of multiple right-sided chest drainage catheters with decreased pleural disease. Trace right lateral pneumothorax. Improved aeration of right lung base with residual hazy infiltrate in the region. Cutaneous staples over the right lower lateral chest with small amount of subcutaneous emphysema. Electronically Signed   By: Donavan Foil M.D.   On: 04/03/2017 20:51     ASSESSMENT AND PLAN:   57 year old male with EtOH abuse who presents with shortness of breath and found to have large empyema and CT scan  1. Tension Empyema/right lateral apex pneumothorax with reexpansion of right upper lobe: Patient is status post chest tube placement.  require decortication for empyema - 04/03/17.  Appreciate Dr. Genevive Bi consult.  Initial cultures thus far are negative.  Follow-up on final cultures from empyema  Toradol for pain  Continue antibiotic for now as per Pulm and surgery team. Continue chest tube to suction per Dr Hampton Abbot.  2. Hyponatremia: This is due to empyema and dehydration, improving. Follow sodium levels  3. EtOH abuse: Uneventful detox so far  Continue CIWA protocol  4. Severe protein calorie malnutrition: Dietary consultation requested and appreciated. Continue dietary supplement  5. One out of 2 gram-positive cocci staph species which is contaminant  6. Thrombocytosis    Likely reactive to infections.  I discussed with Dr. Jefferson Fuel. Management plans discussed with the patient and he is in agreement.  CODE STATUS: FULL  TOTAL TIME TAKING CARE OF THIS PATIENT: 28 minutes.   PT recommends home with home health upon discharge.  POSSIBLE D/C 2-3 days, DEPENDING ON CLINICAL CONDITION.   Demetrios Loll M.D on 04/05/2017 at 5:48 PM  Between 7am to 6pm - Pager - [phone] After 6pm go to [email or web address] - password [email or web address] [facility]  Office  [phone]  CC: Primary care physician; Patient, No Pcp Per  Note: This dictation was prepared with Dragon dictation along with smaller phrase technology. Any transcriptional errors that result from this process are unintentional.

## 2017-04-06 ENCOUNTER — Inpatient Hospital Stay: Payer: 59

## 2017-04-06 MED ORDER — AMIODARONE IV BOLUS ONLY 150 MG/100ML
INTRAVENOUS | Status: AC
  Administered 2017-04-06: 150 mg
  Filled 2017-04-06: qty 100

## 2017-04-06 MED ORDER — OXYCODONE-ACETAMINOPHEN 7.5-325 MG PO TABS
1.0000 | ORAL_TABLET | ORAL | Status: DC | PRN
Start: 2017-04-06 — End: 2017-04-11
  Administered 2017-04-06 – 2017-04-10 (×22): 2 via ORAL
  Administered 2017-04-10: 1 via ORAL
  Administered 2017-04-10 – 2017-04-11 (×4): 2 via ORAL
  Filled 2017-04-06 (×27): qty 2

## 2017-04-06 MED ORDER — AMIODARONE HCL IN DEXTROSE 360-4.14 MG/200ML-% IV SOLN
60.0000 mg/h | INTRAVENOUS | Status: AC
  Administered 2017-04-06: 60 mg/h via INTRAVENOUS
  Filled 2017-04-06: qty 200

## 2017-04-06 MED ORDER — METOPROLOL TARTRATE 5 MG/5ML IV SOLN
5.0000 mg | Freq: Once | INTRAVENOUS | Status: AC
  Administered 2017-04-06: 5 mg via INTRAVENOUS

## 2017-04-06 MED ORDER — ALBUTEROL SULFATE (2.5 MG/3ML) 0.083% IN NEBU: 2.5000 mg | INHALATION_SOLUTION | Freq: Four times a day (QID) | RESPIRATORY_TRACT | Status: DC | PRN

## 2017-04-06 MED ORDER — LORAZEPAM 2 MG/ML IJ SOLN
1.0000 mg | Freq: Once | INTRAMUSCULAR | Status: AC
  Administered 2017-04-06: 1 mg via INTRAVENOUS

## 2017-04-06 MED ORDER — LORAZEPAM 2 MG/ML IJ SOLN
INTRAMUSCULAR | Status: AC
  Administered 2017-04-06: 1 mg via INTRAVENOUS
  Filled 2017-04-06: qty 1

## 2017-04-06 MED ORDER — AMIODARONE HCL IN DEXTROSE 360-4.14 MG/200ML-% IV SOLN
30.0000 mg/h | INTRAVENOUS | Status: DC
  Administered 2017-04-07: 30 mg/h via INTRAVENOUS
  Filled 2017-04-06 (×2): qty 200

## 2017-04-06 MED ORDER — AMIODARONE LOAD VIA INFUSION
150.0000 mg | Freq: Once | INTRAVENOUS | Status: DC
  Filled 2017-04-06: qty 83.34

## 2017-04-06 MED ORDER — METOPROLOL TARTRATE 5 MG/5ML IV SOLN
INTRAVENOUS | Status: AC
  Administered 2017-04-06: 5 mg via INTRAVENOUS
  Filled 2017-04-06: qty 5

## 2017-04-06 NOTE — Progress Notes (Signed)
[facility] at [place] NAME: Kristopher [name]    MR#:  [id number]  DATE OF BIRTH:  October 19, 1960  SUBJECTIVE:   The patient has no complaints.  He developed A. fib with RVR this morning, on amiodarone drip. REVIEW OF SYSTEMS:    Review of Systems  Constitutional: Negative for fever, chills weight loss Positive for generalized weakness and fatigue HENT: Negative for ear pain, nosebleeds, congestion, facial swelling, rhinorrhea, neck pain, neck stiffness and ear discharge.   Respiratory: no shortness of breath or cough Cardiovascular: Negative for chest pain, palpitations and leg swelling.  Gastrointestinal: Negative for heartburn, abdominal pain, vomiting, diarrhea or consitpation Genitourinary: Negative for dysuria, urgency, frequency, hematuria Musculoskeletal: Negative for back pain or joint pain Neurological: Negative for dizziness, seizures, syncope, focal weakness,  numbness and headaches.  Hematological: Does not bruise/bleed easily.  Psychiatric/Behavioral: Negative for hallucinations, confusion, dysphoric mood  Tolerating Diet: yes   DRUG ALLERGIES:   Allergies  Allergen Reactions  . Percocet [Oxycodone-Acetaminophen] Itching    VITALS:  Blood pressure 108/75, pulse 84, temperature 98.4 F (36.9 C), temperature source Oral, resp. rate (!) 29, height 5\' 11"  (1.803 m), weight 128 lb 12 oz (58.4 kg), SpO2 99 %.  PHYSICAL EXAMINATION:  Constitutional: Appears thin. No distress. HENT: Normocephalic. Marland Kitchen Oropharynx is clear and moist.  Eyes: Conjunctivae and EOM are normal. PERRLA, no scleral icterus.  Neck: Normal ROM. Neck supple. No JVD. No tracheal deviation. CVS: Irregular rate and rhythm, tachycardia, no murmurs, no gallops, no carotid bruit.  Pulmonary: Chest tube placed with decreased breath sounds right upper lobe . Abdominal: Soft. BS +,  no distension, tenderness, rebound or guarding.  Musculoskeletal: Normal range of motion. No  edema and no tenderness.  Neuro: Alert. CN 2-12 grossly intact. No focal deficits. Skin: Skin is warm and dry. No rash noted. Psychiatric: Normal mood and affect.    LABORATORY PANEL:   CBC Recent Labs  Lab 04/05/17 0453  WBC 10.5  HGB 9.5*  HCT 27.4*  PLT 625*   ------------------------------------------------------------------------------------------------------------------  Chemistries  Recent Labs  Lab 04/05/17 0453  NA 132*  K 3.9  CL 95*  CO2 30  GLUCOSE 128*  BUN <5*  CREATININE 0.34*  CALCIUM 8.4*  MG 1.8   ------------------------------------------------------------------------------------------------------------------  Cardiac Enzymes No results for input(s): TROPONINI in the last 168 hours. ------------------------------------------------------------------------------------------------------------------  RADIOLOGY:  Dg Chest Port 1 View  Result Date: 04/06/2017 CLINICAL DATA:  Follow-up empyema EXAM: PORTABLE CHEST 1 VIEW COMPARISON:  04/05/2017 FINDINGS: Multiple chest tubes are again noted on the right and stable. No pneumothorax is noted. Residual pleural fluid is noted laterally unchanged from the prior exam. Right basilar atelectasis is noted. The left lung is well aerated without focal abnormality. Cardiac shadow is stable. IMPRESSION: No evidence of pneumothorax. Stable right-sided effusion with multiple chest tubes in place. Electronically Signed   By: Inez Catalina M.D.   On: 04/06/2017 07:24   Dg Chest Port 1 View  Result Date: 04/05/2017 CLINICAL DATA:  Patient status post right thoracotomy and decortication for a right lower lobe abscess empyema 04/03/2017. EXAM: PORTABLE CHEST 1 VIEW COMPARISON:  Single-view of the chest 04/04/2017. FINDINGS: Four right chest tubes remain in place. Pleural and parenchymal opacities in the right mid and lower lung zones are unchanged. Tiny right pneumothorax is unchanged. The left lung is expanded and clear. Heart  size is upper normal. Aortic atherosclerosis is seen. IMPRESSION: Tiny right pneumothorax with chest tubes in place. No  change in a small right pleural effusion or pleural thickening and basilar atelectasis. Electronically Signed   By: Inge Rise M.D.   On: 04/05/2017 10:29     ASSESSMENT AND PLAN:   57 year old male with EtOH abuse who presents with shortness of breath and found to have large empyema and CT scan  1. Tension Empyema/right lateral apex pneumothorax with reexpansion of right upper lobe: Patient is status post chest tube placement.  require decortication for empyema - 04/03/17.  Initial cultures thus far are negative. Follow-up on final cultures from empyema  Toradol for pain  Continue Zosyn for now as per Pulm and surgery team. Continue chest tube to suction and needs keeping chest tube and follow-up Dr. Genevive Bi as outpatient per Dr Hampton Abbot.  2. Hyponatremia: This is due to empyema and dehydration, improving. Follow sodium levels  3. EtOH abuse: Uneventful detox so far  Continue CIWA protocol  4. Severe protein calorie malnutrition: Dietary consultation requested and appreciated. Continue dietary supplement  5. One out of 2 gram-positive cocci staph species which is contaminant  6. Thrombocytosis    Likely reactive to infections.  New onset A. fib.  Continue amiodarone.  Follow-up Dr. Jefferson Fuel.  I discussed with Dr. Jefferson Fuel and Dr. Hampton Abbot. Management plans discussed with the patient and he is in agreement.  CODE STATUS: FULL  TOTAL TIME TAKING CARE OF THIS PATIENT: 36 minutes.   PT recommends home with home health upon discharge.  POSSIBLE D/C 3 days, DEPENDING ON CLINICAL CONDITION.   Demetrios Loll M.D on 04/06/2017 at 4:26 PM  Between 7am to 6pm - Pager - [phone] After 6pm go to [email or web address] - password [email or web address] [facility]  Office  [phone]  CC: Primary care physician; Patient, No Pcp Per  Note: This dictation was  prepared with Dragon dictation along with smaller phrase technology. Any transcriptional errors that result from this process are unintentional.

## 2017-04-06 NOTE — Progress Notes (Signed)
Pt went into afib rvr, HR 140-160's. MD notified. 12 lead EKG ordered. 5mg  of Lopressor ordered. Pt started on an amiodarone infusion. Floor orders held, pt transferred to step down. Will continue to monitor.

## 2017-04-06 NOTE — Progress Notes (Signed)
Pt converted back into NSR

## 2017-04-06 NOTE — Plan of Care (Signed)
Patient makes needs known.  Asks for pain medications q 6 hours for chest pain.  No acute distress noted.  Minimal drainage from chest tubes.  Tubes intact. No leakage.  Will continue to monitor.

## 2017-04-06 NOTE — Progress Notes (Signed)
04/06/2017  Subjective: Patient is 3 Days Post-Op status post right thoracotomy with decortication.  No acute events overnight.  Patient's pain continues to be better controlled.  He still in the intensive care unit but only because there were no beds available as the transfer process has been started yesterday.  Vital signs: Temp:  [98.9 F (37.2 C)-99.2 F (37.3 C)] 98.9 F (37.2 C) (01/27 0200) Pulse Rate:  [81-99] 82 (01/27 0700) Resp:  [19-37] 22 (01/27 0700) BP: (103-132)/(71-94) 117/74 (01/27 0700) SpO2:  [93 %-97 %] 95 % (01/27 0700) FiO2 (%):  [97 %] 97 % (01/26 1421)   Intake/Output: 01/26 0701 - 01/27 0700 In: 2180 [P.O.:480; I.V.:1650; IV Piggyback:50] Out: 2720 [Urine:2525; Chest Tube:195] Last BM Date: 04/02/17  Physical Exam: Constitutional: No acute distress Pulm: Right-sided chest tubes x4 in place to suction with no air leak.  Incisions are clean dry and intact with staples in place.   Labs:  Recent Labs    04/04/17 0702 04/05/17 0453  WBC 12.8* 10.5  HGB 10.2* 9.5*  HCT 30.3* 27.4*  PLT 693* 625*   Recent Labs    04/04/17 0702 04/05/17 0453  NA 128* 132*  K 4.5 3.9  CL 92* 95*  CO2 29 30  GLUCOSE 139* 128*  BUN 8 <5*  CREATININE 0.48* 0.34*  CALCIUM 8.6* 8.4*   No results for input(s): LABPROT, INR in the last 72 hours.  Imaging: Dg Chest Port 1 View  Result Date: 04/06/2017 CLINICAL DATA:  Follow-up empyema EXAM: PORTABLE CHEST 1 VIEW COMPARISON:  04/05/2017 FINDINGS: Multiple chest tubes are again noted on the right and stable. No pneumothorax is noted. Residual pleural fluid is noted laterally unchanged from the prior exam. Right basilar atelectasis is noted. The left lung is well aerated without focal abnormality. Cardiac shadow is stable. IMPRESSION: No evidence of pneumothorax. Stable right-sided effusion with multiple chest tubes in place. Electronically Signed   By: Inez Catalina M.D.   On: 04/06/2017 07:24     Assessment/Plan: 57 year old male status post right thoracotomy with decortication.  - Awaiting bed on the floor for transfer to floor status. -Discontinue Foley catheter today. -Out of bed and ambulate.  The patient may be on waterseal temporarily briefly for ambulation.   Melvyn Neth, [place]

## 2017-04-06 NOTE — Progress Notes (Signed)
[facility] Progess Note    SYNOPSIS   46 M recently treated at [facility] for RLL necrotizing pneumonia effusion, empyema. Patient is now status post preoperative bronchoscopy with right thoracotomy and decortication.   ASSESSMENT/PLAN   Necrotizing pneumonia with empyema, status post decortication. Multiple chest tubes in place. Pending culture of the pleural space ID and sensitivity. Presently being treated empirically with Zosyn. Gram-positive cocci in pairs noted pending identification speciation and sensitivity. Presently on Zosyn with clinical improvement, we'll hold on adding vancomycin pending results. No air leaks appreciated on exam today. Will obtain chest x-ray this morning   Hyponatremia. Saline resuscitation  Anemia. No evidence of active bleeding, minimal drainage and chest tube  INTAKE / OUTPUT:  Intake/Output Summary (Last 24 hours) at 04/06/2017 0909 Last data filed at 04/06/2017 0700 Gross per 24 hour  Intake 1940 ml  Output 2655 ml  Net -715 ml     Name: Kristopher [name] MRN: [id number] DOB: 12/29/1960    ADMISSION DATE:  03/28/2017  SUBJECTIVE:   Patient is resting comfortably, doing well. Status post decortication and thoracotomy. Multiple chest tubes with Y-connection on the right.Marland Kitchen   VITAL SIGNS: Temp:  [98.9 F (37.2 C)-99.2 F (37.3 C)] 98.9 F (37.2 C) (01/27 0200) Pulse Rate:  [81-99] 82 (01/27 0700) Resp:  [19-37] 22 (01/27 0700) BP: (103-132)/(71-94) 117/74 (01/27 0700) SpO2:  [93 %-97 %] 95 % (01/27 0700) FiO2 (%):  [97 %] 97 % (01/26 1421)   PHYSICAL EXAMINATION: Physical Examination:   VS: BP 117/74   Pulse 82   Temp 98.9 F (37.2 C) (Oral)   Resp (!) 22   Ht 5\' 11"  (1.803 m)   Wt 128 lb 12 oz (58.4 kg)   SpO2 95%   BMI 17.96 kg/m   General Appearance: No distress  Neuro:without focal findings, mental status normal. HEENT: PERRLA, EOM intact. Pulmonary: Diminished breath sounds noted on the right lung base  with aeration appreciated in the right upper lung zone. Multiple chest tubes appreciated on the right. Full aeration on the left lung Chest tube does not reveal any air leak in for VAC. CardiovascularNormal S1,S2.  No m/r/g.   Abdomen: Benign, Soft, non-tender. Skin:   warm, no rashes, no ecchymosis  Extremities: normal, no cyanosis, clubbing.    LABORATORY PANEL:   CBC Recent Labs  Lab 04/05/17 0453  WBC 10.5  HGB 9.5*  HCT 27.4*  PLT 625*    Chemistries  Recent Labs  Lab 04/05/17 0453  NA 132*  K 3.9  CL 95*  CO2 30  GLUCOSE 128*  BUN <5*  CREATININE 0.34*  CALCIUM 8.4*  MG 1.8  PHOS 3.5    Recent Labs  Lab 04/03/17 1952  GLUCAP 99   No results for input(s): PHART, PCO2ART, PO2ART in the last 168 hours. No results for input(s): AST, ALT, ALKPHOS, BILITOT, ALBUMIN in the last 168 hours.  Cardiac Enzymes No results for input(s): TROPONINI in the last 168 hours.  RADIOLOGY:  Dg Chest Port 1 View  Result Date: 04/06/2017 CLINICAL DATA:  Follow-up empyema EXAM: PORTABLE CHEST 1 VIEW COMPARISON:  04/05/2017 FINDINGS: Multiple chest tubes are again noted on the right and stable. No pneumothorax is noted. Residual pleural fluid is noted laterally unchanged from the prior exam. Right basilar atelectasis is noted. The left lung is well aerated without focal abnormality. Cardiac shadow is stable. IMPRESSION: No evidence of pneumothorax. Stable right-sided effusion with multiple chest tubes in place. Electronically Signed  By: Inez Catalina M.D.   On: 04/06/2017 07:24   Dg Chest Port 1 View  Result Date: 04/05/2017 CLINICAL DATA:  Patient status post right thoracotomy and decortication for a right lower lobe abscess empyema 04/03/2017. EXAM: PORTABLE CHEST 1 VIEW COMPARISON:  Single-view of the chest 04/04/2017. FINDINGS: Four right chest tubes remain in place. Pleural and parenchymal opacities in the right mid and lower lung zones are unchanged. Tiny right pneumothorax  is unchanged. The left lung is expanded and clear. Heart size is upper normal. Aortic atherosclerosis is seen. IMPRESSION: Tiny right pneumothorax with chest tubes in place. No change in a small right pleural effusion or pleural thickening and basilar atelectasis. Electronically Signed   By: Inge Rise M.D.   On: 04/05/2017 10:29    Hermelinda Dellen, DO  1/27/2019Patient ID: Kristopher [name], male   DOB: 1960-11-02, 57 y.o.   MRN: [id number] Patient ID: Kristopher [name], male   DOB: 1960-04-10, 57 y.o.   MRN: [id number] Patient ID: Kristopher [name], male   DOB: [birth year], 57 y.o.   MRN: [id number]

## 2017-04-07 DIAGNOSIS — J850 Gangrene and necrosis of lung: Secondary | ICD-10-CM

## 2017-04-07 LAB — ACID FAST SMEAR (AFB, MYCOBACTERIA)

## 2017-04-07 LAB — CBC
HCT: 25.1 % — ABNORMAL LOW (ref 40.0–52.0)
Hemoglobin: 8.4 g/dL — ABNORMAL LOW (ref 13.0–18.0)
MCH: 30.5 pg (ref 26.0–34.0)
MCHC: 33.7 g/dL (ref 32.0–36.0)
MCV: 90.6 fL (ref 80.0–100.0)
Platelets: 676 10*3/uL — ABNORMAL HIGH (ref 150–440)
RBC: 2.77 MIL/uL — AB (ref 4.40–5.90)
RDW: 14.4 % (ref 11.5–14.5)
WBC: 8.2 10*3/uL (ref 3.8–10.6)

## 2017-04-07 LAB — MAGNESIUM: MAGNESIUM: 1.9 mg/dL (ref 1.7–2.4)

## 2017-04-07 LAB — BASIC METABOLIC PANEL
Anion gap: 7 (ref 5–15)
BUN: 5 mg/dL — ABNORMAL LOW (ref 6–20)
CALCIUM: 8.5 mg/dL — AB (ref 8.9–10.3)
CHLORIDE: 94 mmol/L — AB (ref 101–111)
CO2: 33 mmol/L — ABNORMAL HIGH (ref 22–32)
Creatinine, Ser: 0.37 mg/dL — ABNORMAL LOW (ref 0.61–1.24)
GFR calc Af Amer: >60 mL/min (ref >60)
GFR calc non Af Amer: >60 mL/min (ref >60)
Glucose, Bld: 95 mg/dL (ref 65–99)
Potassium: 3.8 mmol/L (ref 3.5–5.1)
Sodium: 134 mmol/L — ABNORMAL LOW (ref 135–145)

## 2017-04-07 LAB — ACID FAST SMEAR (AFB): ACID FAST SMEAR - AFSCU2: NEGATIVE

## 2017-04-07 LAB — SURGICAL PATHOLOGY

## 2017-04-07 LAB — PHOSPHORUS: PHOSPHORUS: 3.8 mg/dL (ref 2.5–4.6)

## 2017-04-07 MED ORDER — AMIODARONE HCL 200 MG PO TABS
400.0000 mg | ORAL_TABLET | Freq: Two times a day (BID) | ORAL | Status: DC
  Administered 2017-04-07 – 2017-04-10 (×8): 400 mg via ORAL
  Filled 2017-04-07 (×9): qty 2

## 2017-04-07 MED ORDER — AMOXICILLIN-POT CLAVULANATE 875-125 MG PO TABS
1.0000 | ORAL_TABLET | Freq: Two times a day (BID) | ORAL | Status: DC
  Administered 2017-04-07 – 2017-04-11 (×9): 1 via ORAL
  Filled 2017-04-07 (×9): qty 1

## 2017-04-07 NOTE — Progress Notes (Addendum)
No distress No complaints Appears comfortable Had transient AF RVR last night.  Received amiodarone bolus IV and converted to NSR. He informs me that he has had frequent bouts of tachycardia/palpitations prior to admission   Vitals:   04/07/17 0642 04/07/17 0700 04/07/17 0800 04/07/17 0900  BP:  (!) 136/95 120/75 (!) 133/93  Pulse: 78 78 81 86  Resp: (!) 27 (!) 29 20 17   Temp:  97.7 F (36.5 C)    TempSrc:  Axillary    SpO2: 96% 96% 96% 98%  Weight:      Height:        PHYSICAL EXAMINATION: General: Cachectic, NAD Neuro: CNs intact, motor/sensory intact HEENT: NCAT, sclerae white, severe temporal wasting Cardiovascular: Regular, no M Lungs:  No wheezes or other adventitious sounds heard anteriorly Abdomen: Scaphoid, nontender, bowel sounds present Ext: No clubbing cyanosis or edema  BMP Latest Ref Rng & Units 04/07/2017 04/05/2017 04/04/2017  Glucose 65 - 99 mg/dL 95 128(H) 139(H)  BUN 6 - 20 mg/dL 5(L) <5(L) 8  Creatinine 0.61 - 1.24 mg/dL 0.37(L) 0.34(L) 0.48(L)  Sodium 135 - 145 mmol/L 134(L) 132(L) 128(L)  Potassium 3.5 - 5.1 mmol/L 3.8 3.9 4.5  Chloride 101 - 111 mmol/L 94(L) 95(L) 92(L)  CO2 22 - 32 mmol/L 33(H) 30 29  Calcium 8.9 - 10.3 mg/dL 8.5(L) 8.4(L) 8.6(L)    CBC Latest Ref Rng & Units 04/07/2017 04/05/2017 04/04/2017  WBC 3.8 - 10.6 K/uL 8.2 10.5 12.8(H)  Hemoglobin 13.0 - 18.0 g/dL 8.4(L) 9.5(L) 10.2(L)  Hematocrit 40.0 - 52.0 % 25.1(L) 27.4(L) 30.3(L)  Platelets 150 - 440 K/uL 676(H) 625(H) 693(H)    CXR: NNF  Results for orders placed or performed during the hospital encounter of 03/28/17  Culture, blood (routine x 2)     Status: Abnormal   Collection Time: 03/28/17  8:35 PM  Result Value Ref Range Status   Specimen Description   Final    BLOOD RIGHT FOREARM Performed at [facility], [address] [place], [place] [address]    Special Requests   Final    BOTTLES DRAWN AEROBIC AND ANAEROBIC Blood Culture adequate volume Performed at  [facility], [address] [place], [place] [address]    Culture  Setup Time   Final    GRAM POSITIVE COCCI ANAEROBIC BOTTLE ONLY CRITICAL RESULT CALLED TO, READ BACK BY AND VERIFIED WITH: Izabellah Dadisman BESANTI AT 4920 03/30/17.PMH    Culture (A)  Final    STAPHYLOCOCCUS SPECIES (COAGULASE NEGATIVE) THE SIGNIFICANCE OF ISOLATING THIS ORGANISM FROM A SINGLE SET OF BLOOD CULTURES WHEN MULTIPLE SETS ARE DRAWN IS UNCERTAIN. PLEASE NOTIFY THE MICROBIOLOGY DEPARTMENT WITHIN ONE WEEK IF SPECIATION AND SENSITIVITIES ARE REQUIRED. Performed at [facility], [place] [address], [place], [place] [address]    Report Status 04/01/2017 FINAL  Final  Blood Culture ID Panel (Reflexed)     Status: Abnormal   Collection Time: 03/28/17  8:35 PM  Result Value Ref Range Status   Enterococcus species NOT DETECTED NOT DETECTED Final   Listeria monocytogenes NOT DETECTED NOT DETECTED Final   Staphylococcus species DETECTED (A) NOT DETECTED Final    Comment: Methicillin (oxacillin) susceptible coagulase negative staphylococcus. Possible blood culture contaminant (unless isolated from more than one blood culture draw or clinical case suggests pathogenicity). No antibiotic treatment is indicated for blood  culture contaminants. CRITICAL RESULT CALLED TO, READ BACK BY AND VERIFIED WITH: Flara Storti BESANTI AT 2197 03/30/17.PMH    Staphylococcus aureus NOT DETECTED NOT DETECTED  Final   Methicillin resistance NOT DETECTED NOT DETECTED Final   Streptococcus species NOT DETECTED NOT DETECTED Final   Streptococcus agalactiae NOT DETECTED NOT DETECTED Final   Streptococcus pneumoniae NOT DETECTED NOT DETECTED Final   Streptococcus pyogenes NOT DETECTED NOT DETECTED Final   Acinetobacter baumannii NOT DETECTED NOT DETECTED Final   Enterobacteriaceae species NOT DETECTED NOT DETECTED Final   Enterobacter cloacae complex NOT DETECTED NOT DETECTED Final   Escherichia coli NOT DETECTED NOT DETECTED Final   Klebsiella  oxytoca NOT DETECTED NOT DETECTED Final   Klebsiella pneumoniae NOT DETECTED NOT DETECTED Final   Proteus species NOT DETECTED NOT DETECTED Final   Serratia marcescens NOT DETECTED NOT DETECTED Final   Haemophilus influenzae NOT DETECTED NOT DETECTED Final   Neisseria meningitidis NOT DETECTED NOT DETECTED Final   Pseudomonas aeruginosa NOT DETECTED NOT DETECTED Final   Candida albicans NOT DETECTED NOT DETECTED Final   Candida glabrata NOT DETECTED NOT DETECTED Final   Candida krusei NOT DETECTED NOT DETECTED Final   Candida parapsilosis NOT DETECTED NOT DETECTED Final   Candida tropicalis NOT DETECTED NOT DETECTED Final    Comment: Performed at [facility], [address] [place], [place] [address]  Culture, blood (routine x 2)     Status: None   Collection Time: 03/28/17  8:50 PM  Result Value Ref Range Status   Specimen Description BLOOD RIGHT ANTECUBITAL  Final   Special Requests   Final    BOTTLES DRAWN AEROBIC AND ANAEROBIC Blood Culture adequate volume   Culture   Final    NO GROWTH 5 DAYS Performed at [facility], [address] [place], [place] [address]    Report Status 04/02/2017 FINAL  Final  Body fluid culture     Status: None   Collection Time: 03/29/17 11:31 AM  Result Value Ref Range Status   Specimen Description   Final    PLEURAL Performed at [facility], [address] [place], [place] [address]    Special Requests   Final    NONE Performed at [facility], [place], [place] [address]    Gram Stain   Final    ABUNDANT WBC PRESENT,BOTH PMN AND MONONUCLEAR NO ORGANISMS SEEN    Culture   Final    NO GROWTH 3 DAYS Performed at [facility], [address] [place], [place] [address]    Report Status 04/02/2017 FINAL  Final  MRSA PCR Screening     Status: None   Collection Time: 04/02/17  1:47 PM  Result Value Ref Range Status   MRSA by PCR NEGATIVE NEGATIVE Final    Comment:        The  GeneXpert MRSA Assay (FDA approved for NASAL specimens only), is one component of a comprehensive MRSA colonization surveillance program. It is not intended to diagnose MRSA infection nor to guide or monitor treatment for MRSA infections. Performed at [facility], [address] [place], [place] [address]   Aerobic/Anaerobic Culture (surgical/deep wound)     Status: None (Preliminary result)   Collection Time: 04/03/17  2:33 PM  Result Value Ref Range Status   Specimen Description   Final    TISSUE PLEURAL PEEL Performed at [facility], [address], [place], [place] [address]    Special Requests   Final    NONE Performed at [facility], [address] [place], [place] [address]    Gram Stain   Final  ABUNDANT WBC PRESENT, PREDOMINANTLY PMN NO ORGANISMS SEEN    Culture   Final    NO GROWTH 2 DAYS NO ANAEROBES ISOLATED; CULTURE IN PROGRESS FOR 5 DAYS Performed at [place] [address], [place], [place] [address]    Report Status PENDING  Incomplete  Acid Fast Smear (AFB)     Status: None   Collection Time: 04/03/17  2:33 PM  Result Value Ref Range Status   AFB Specimen Processing Comment  Final    Comment: Tissue Grinding and Digestion/Decontamination   Acid Fast Smear Negative  Final    Comment: (NOTE) Performed At: [facility] [place], [place] [id number] Rush Farmer MD [id number]    Source (AFB) TISSUE  Final    Comment: PLEURAL PEEL Performed at [facility], [address] [place], [place] [address]   Aerobic/Anaerobic Culture (surgical/deep wound)     Status: None (Preliminary result)   Collection Time: 04/03/17  3:49 PM  Result Value Ref Range Status   Specimen Description   Final    LUNG RIGHT LOWER RIGHT LOWER LOBE OF LUNG Performed at [facility], [address] [place], [place] [address]    Special Requests   Final    NONE Performed at [facility], [address] [place], [place] [address]    Gram Stain NO WBC SEEN RARE GRAM POSITIVE COCCI IN PAIRS   Final   Culture   Final    NO GROWTH 3 DAYS NO ANAEROBES ISOLATED; CULTURE IN PROGRESS FOR 5 DAYS Performed at [facility], [place] [address] [place], [place] [address]    Report Status PENDING  Incomplete  Acid Fast Smear (AFB)     Status: None   Collection Time: 04/03/17  3:49 PM  Result Value Ref Range Status   AFB Specimen Processing Concentration  Final   Acid Fast Smear Negative  Final    Comment: (NOTE) Performed At: [facility] [place], [place] [id number] Rush Farmer MD [id number]    Source (AFB) LUNG  Final    Comment: RIGHT LOWER LOBE OF LUNG Performed at [facility], [address] [place], [place] [address]    Anti-infectives (From admission, onward)   Start     Dose/Rate Route Frequency Ordered Stop   04/07/17 1000  amoxicillin-clavulanate (AUGMENTIN) 875-125 MG per tablet 1 tablet     1 tablet Oral 2 times daily 04/07/17 0931     04/03/17 2200  piperacillin-tazobactam (ZOSYN) IVPB 3.375 g  Status:  Discontinued     3.375 g 12.5 mL/hr over 240 Minutes Intravenous Every 8 hours 04/03/17 1938 04/07/17 0931   04/03/17 1348  piperacillin-tazobactam (ZOSYN) 3.375 GM/50ML IVPB    Comments:  Lyman Bishop   : cabinet override      04/03/17 1348 04/03/17 1355   03/29/17 0500  vancomycin (VANCOCIN) IVPB 1000 mg/200 mL premix  Status:  Discontinued     1,000 mg 200 mL/hr over 60 Minutes Intravenous Every 12 hours 03/29/17 0150 03/29/17 1146   03/28/17 2200  piperacillin-tazobactam (ZOSYN) IVPB 3.375 g  Status:  Discontinued     3.375 g 12.5 mL/hr over 240 Minutes Intravenous Every 8 hours 03/28/17 2134 04/03/17 1938   03/28/17 2145  vancomycin (VANCOCIN) IVPB 1000 mg/200 mL premix     1,000 mg 200 mL/hr over 60 Minutes Intravenous  Once 03/28/17 2134 03/29/17 0036   03/28/17 2030  ceFEPIme (MAXIPIME) 1  g in dextrose  5 % 50 mL IVPB     1 g 100 mL/hr over 30 Minutes Intravenous  Once 03/28/17 2027 03/29/17 0943     IMPRESSION: RLL necrotizing pneumonia c/b empyema, now S/P decortication Severe protein-calorie malnutrition with cachexia Chronic alcohol abuse -no evidence of withdrawal Paroxysmal AF with RVR, NSR presently  Transfer to [place] today Chest tube mgmt per Dr Genevive Bi Change abx to Augmentin and will need prolonged course (4 weeks) Continue to promoted nutrition as able Continue analgesia as needed Begin amiodarone load orally Cardiology consultation requested  I reviewed antibiotic selection and duration with Dr Ola Spurr who has kindly agreed to see patient  After transfer to [place] floor, PCCM will sign off. Please call if we can be of further assistance  Merton Border, MD PCCM service Mobile [phone] Pager [phone] 04/07/2017 2:41 PM

## 2017-04-07 NOTE — Progress Notes (Signed)
Doing well today.  Afib yesterday.  Wounds are clean and dry.  Minimal drainage.  No air leak.  CXRay looks good from my perspective.  No obvious pneumothorax.  Will leave on suction for 7 days postop and then convert to "empyema tubes".  Would recommend IV antibiotics for 6 weeeks given the extent of his necrotizing pneumonia.  [employer].

## 2017-04-07 NOTE — Progress Notes (Addendum)
[place] [facility] [place] NAME: Kristopher [name]    MR#:  [id number]  DATE OF BIRTH:  10-Jan-1961  SUBJECTIVE:   The patient has no complaints.  He got amiodarone bolus IV and converted to NSR. REVIEW OF SYSTEMS:    Review of Systems  Constitutional: Negative for fever, chills weight loss Positive for generalized weakness and fatigue HENT: Negative for ear pain, nosebleeds, congestion, facial swelling, rhinorrhea, neck pain, neck stiffness and ear discharge.   Respiratory: no shortness of breath or cough Cardiovascular: Negative for chest pain, palpitations and leg swelling.  Gastrointestinal: Negative for heartburn, abdominal pain, vomiting, diarrhea or consitpation Genitourinary: Negative for dysuria, urgency, frequency, hematuria Musculoskeletal: Negative for back pain or joint pain Neurological: Negative for dizziness, seizures, syncope, focal weakness,  numbness and headaches.  Hematological: Does not bruise/bleed easily.  Psychiatric/Behavioral: Negative for hallucinations, confusion, dysphoric mood  Tolerating Diet: yes   DRUG ALLERGIES:   Allergies  Allergen Reactions  . Percocet [Oxycodone-Acetaminophen] Itching    VITALS:  Blood pressure (!) 133/93, pulse 86, temperature 97.7 F (36.5 C), temperature source Axillary, resp. rate 17, height 5\' 11"  (1.803 m), weight 128 lb 12 oz (58.4 kg), SpO2 98 %.  PHYSICAL EXAMINATION:  Constitutional: Appears thin. No distress. HENT: Normocephalic. Marland Kitchen Oropharynx is clear and moist.  Eyes: Conjunctivae and EOM are normal. PERRLA, no scleral icterus.  Neck: Normal ROM. Neck supple. No JVD. No tracheal deviation. CVS: Irregular rate and rhythm, tachycardia, no murmurs, no gallops, no carotid bruit.  Pulmonary: Chest tube placed with decreased breath sounds right upper lobe . Abdominal: Soft. BS +,  no distension, tenderness, rebound or guarding.  Musculoskeletal: Normal range of motion. No edema and  no tenderness.  Neuro: Alert. CN 2-12 grossly intact. No focal deficits. Skin: Skin is warm and dry. No rash noted. Psychiatric: Normal mood and affect.    LABORATORY PANEL:   CBC Recent Labs  Lab 04/07/17 0513  WBC 8.2  HGB 8.4*  HCT 25.1*  PLT 676*   ------------------------------------------------------------------------------------------------------------------  Chemistries  Recent Labs  Lab 04/07/17 0513  NA 134*  K 3.8  CL 94*  CO2 33*  GLUCOSE 95  BUN 5*  CREATININE 0.37*  CALCIUM 8.5*  MG 1.9   ------------------------------------------------------------------------------------------------------------------  Cardiac Enzymes No results for input(s): TROPONINI in the last 168 hours. ------------------------------------------------------------------------------------------------------------------  RADIOLOGY:  Dg Chest Port 1 View  Result Date: 04/06/2017 CLINICAL DATA:  Follow-up empyema EXAM: PORTABLE CHEST 1 VIEW COMPARISON:  04/05/2017 FINDINGS: Multiple chest tubes are again noted on the right and stable. No pneumothorax is noted. Residual pleural fluid is noted laterally unchanged from the prior exam. Right basilar atelectasis is noted. The left lung is well aerated without focal abnormality. Cardiac shadow is stable. IMPRESSION: No evidence of pneumothorax. Stable right-sided effusion with multiple chest tubes in place. Electronically Signed   By: Inez Catalina M.D.   On: 04/06/2017 07:24     ASSESSMENT AND PLAN:   57 year old male with EtOH abuse who presents with shortness of breath and found to have large empyema and CT scan  1. Tension Empyema/right lateral apex pneumothorax with reexpansion of right upper lobe: Patient is status post chest tube placement.  require decortication for empyema - 04/03/17.  Initial cultures thus far are negative. Follow-up on final cultures from empyema  Toradol for pain He is on Zosyn, changed to augmentin. Per Dr.  Genevive Bi,  Will leave on suction for 7 days postop and then convert to "  empyema tubes". Would recommend IV antibiotics for 6 weeeks given the extent of his necrotizing pneumonia. Change abx to Augmentin and will need prolonged course (4 weeks) per Dr. Alva Garnet.  2. Hyponatremia: This is due to empyema and dehydration, improving. Follow sodium levels  3. EtOH abuse: Uneventful detox so far  on CIWA protocol  4. Severe protein calorie malnutrition: Dietary consultation requested and appreciated. Continue dietary supplement  5. One out of 2 gram-positive cocci staph species which is contaminant  6. Thrombocytosis    Likely reactive to infections.  New onset A. fib.  Converted to normal sinus rhythm on IV amiodarone.  Change to p.o. amiodarone per Dr. Alva Garnet.  I discussed with Dr. Alva Garnet. Management plans discussed with the patient and he is in agreement.  CODE STATUS: FULL  TOTAL TIME TAKING CARE OF THIS PATIENT: 28 minutes.   PT recommends home with home health upon discharge.  POSSIBLE D/C 2 days, DEPENDING ON CLINICAL CONDITION.   Demetrios Loll M.D on 04/07/2017 at 5:44 PM  Between 7am to 6pm - Pager - [phone] After 6pm go to [email or web address] - password [email or web address] [place] [facility]  Office  [phone]  CC: Primary care physician; Patient, No Pcp Per  Note: This dictation was prepared with Dragon dictation along with smaller phrase technology. Any transcriptional errors that result from this process are unintentional.

## 2017-04-07 NOTE — Progress Notes (Signed)
Pt ambulated about 29ft around the unit. Pt stated that he felt winded, VSS throughout the walk. Pt currently sitting in recliner in no apparent distress. Pt was educated on using an IS. Reached 1000 on IS.

## 2017-04-07 NOTE — Consult Note (Signed)
[facility]     Reason for Nixon   Referring Physician: Alva Garnet, D Date of Admission:  03/28/2017   Active Problems:   Empyema ([place])   Lung abscess ([place])   HPI: Kristopher [name] is a 57 y.o. 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 Had recent admission for PNA in Dec and treated with augmentin. On this admission wbc was 15 but but no fever. Had chest tube placed and had cx neg from pleural fluid both initially and after decortication. BCX + cns in one bottle. Has been on zosyn since admission. Has Chest tube in place and reports feeling much much better. reports he has quit smoking and decreased from 12 to 6 beers a day.   Past Medical History:  Diagnosis Date  . Electric injury   . PNA (pneumonia)    Past Surgical History:  Procedure Laterality Date  . DECORTICATION N/A 04/03/2017   Procedure: DECORTICATION;  Surgeon: Nestor Lewandowsky, MD;  Location: [facility] ORS;  Service: Thoracic;  Laterality: N/A;  debridement of lung abscess  . SKIN GRAFT    . THORACOTOMY Right 04/03/2017   Procedure: THORACOTOMY MAJOR;  Surgeon: Nestor Lewandowsky, MD;  Location: [facility] ORS;  Service: Thoracic;  Laterality: Right;  Marland Kitchen VIDEO BRONCHOSCOPY N/A 04/03/2017   Procedure: PREOP BRONCHOSCOPY;  Surgeon: Nestor Lewandowsky, MD;  Location: [facility] ORS;  Service: Thoracic;  Laterality: N/A;   Social History   Tobacco Use  . Smoking status: Former Research scientist (life sciences)  . Smokeless tobacco: Never Used  Substance Use Topics  . Alcohol use: Yes  . Drug use: No   History reviewed. No pertinent family history.  Allergies:  Allergies  Allergen Reactions  . Percocet [Oxycodone-Acetaminophen] Itching    Current antibiotics: Antibiotics Given (last 72 hours)    Date/Time Action Medication Dose Rate   04/04/17 2315 New Bag/Given   piperacillin-tazobactam (ZOSYN) IVPB 3.375 g 3.375 g 12.5 mL/hr   04/05/17 0650 New Bag/Given    piperacillin-tazobactam (ZOSYN) IVPB 3.375 g 3.375 g 12.5 mL/hr   04/05/17 1310 New Bag/Given   piperacillin-tazobactam (ZOSYN) IVPB 3.375 g 3.375 g 12.5 mL/hr   04/05/17 2112 New Bag/Given   piperacillin-tazobactam (ZOSYN) IVPB 3.375 g 3.375 g 12.5 mL/hr   04/06/17 0601 New Bag/Given   piperacillin-tazobactam (ZOSYN) IVPB 3.375 g 3.375 g 12.5 mL/hr   04/06/17 1400 New Bag/Given   piperacillin-tazobactam (ZOSYN) IVPB 3.375 g 3.375 g 12.5 mL/hr   04/06/17 2106 New Bag/Given   piperacillin-tazobactam (ZOSYN) IVPB 3.375 g 3.375 g 12.5 mL/hr   04/07/17 1610 New Bag/Given   piperacillin-tazobactam (ZOSYN) IVPB 3.375 g 3.375 g 12.5 mL/hr   04/07/17 1038 Given   amoxicillin-clavulanate (AUGMENTIN) 875-125 MG per tablet 1 tablet 1 tablet       MEDICATIONS: . amiodarone  400 mg Oral BID  . amoxicillin-clavulanate  1 tablet Oral BID  . bisacodyl  10 mg Oral Daily  . chlorhexidine  15 mL Mouth Rinse BID  . feeding supplement (ENSURE ENLIVE)  237 mL Oral QID  . folic acid  1 mg Oral Daily  . mouth rinse  15 mL Mouth Rinse q12n4p  . multivitamin with minerals  1 tablet Oral Daily  . polyethylene glycol  17 g Oral Daily  . thiamine  100 mg Oral Daily    Review of Systems - 11 systems reviewed and negative per HPI   OBJECTIVE: Temp:  [97.7 F (36.5 C)-98.4  F (36.9 C)] 97.7 F (36.5 C) (01/28 0700) Pulse Rate:  [65-93] 86 (01/28 0900) Resp:  [12-34] 17 (01/28 0900) BP: (103-148)/(68-125) 133/93 (01/28 0900) SpO2:  [91 %-100 %] 98 % (01/28 0900) Physical Exam  Constitutional: He is oriented to person, place, and time. Very thin, disheveled HENT: anicteric Mouth/Throat: Oropharynx is clear and dry . No oropharyngeal exudate.  Cardiovascular: Normal rate, regular rhythm and normal heart sounds. Pulmonary/Chest: R side decreased BS, rhonchi and rub.CT with ss drainage Incision from decortication with sutures in place and no evidence infeciton   Abdominal: Soft. Bowel sounds are normal.  He exhibits no distension. There is no tenderness.  Lymphadenopathy: He has no cervical adenopathy.  Neurological: He is alert and oriented to person, place, and time.  Skin: Skin is warm and dry. No rash noted. No erythema.  Psychiatric: He has a normal mood and affect. His behavior is normal.     LABS: Results for orders placed or performed during the hospital encounter of 03/28/17 (from the past 48 hour(s))  Magnesium     Status: None   Collection Time: 04/07/17  5:13 AM  Result Value Ref Range   Magnesium 1.9 1.7 - 2.4 mg/dL    Comment: Performed at [facility], [address], [place], [place] [address]  Phosphorus     Status: None   Collection Time: 04/07/17  5:13 AM  Result Value Ref Range   Phosphorus 3.8 2.5 - 4.6 mg/dL    Comment: Performed at [facility], [address] [place], [place] [address]  Basic metabolic panel     Status: Abnormal   Collection Time: 04/07/17  5:13 AM  Result Value Ref Range   Sodium 134 (L) 135 - 145 mmol/L   Potassium 3.8 3.5 - 5.1 mmol/L   Chloride 94 (L) 101 - 111 mmol/L   CO2 33 (H) 22 - 32 mmol/L   Glucose, Bld 95 65 - 99 mg/dL   BUN 5 (L) 6 - 20 mg/dL   Creatinine, Ser 0.37 (L) 0.61 - 1.24 mg/dL   Calcium 8.5 (L) 8.9 - 10.3 mg/dL   GFR calc non Af Amer >60 >60 mL/min   GFR calc Af Amer >60 >60 mL/min    Comment: (NOTE) The eGFR has been calculated using the CKD EPI equation. This calculation has not been validated in all clinical situations. eGFR's persistently <60 mL/min signify possible Chronic Kidney Disease.    Anion gap 7 5 - 15    Comment: Performed at [facility], [address] [place], [place] [address]  CBC     Status: Abnormal   Collection Time: 04/07/17  5:13 AM  Result Value Ref Range   WBC 8.2 3.8 - 10.6 K/uL   RBC 2.77 (L) 4.40 - 5.90 MIL/uL   Hemoglobin 8.4 (L) 13.0 - 18.0 g/dL   HCT 25.1 (L) 40.0 - 52.0 %   MCV 90.6 80.0 - 100.0 fL   MCH 30.5 26.0 - 34.0 pg   MCHC 33.7 32.0  - 36.0 g/dL   RDW 14.4 11.5 - 14.5 %   Platelets 676 (H) 150 - 440 K/uL    Comment: Performed at [facility], [address] [place], [place] [address]   No components found for: ESR, C REACTIVE PROTEIN MICRO: Recent Results (from the past 720 hour(s))  Culture, blood (routine x 2)     Status: Abnormal   Collection Time: 03/28/17  8:35 PM  Result Value Ref Range Status  Specimen Description   Final    BLOOD RIGHT FOREARM Performed at [facility], [address], [place], [place] [address]    Special Requests   Final    BOTTLES DRAWN AEROBIC AND ANAEROBIC Blood Culture adequate volume Performed at [facility], [address] [place], [place] [address]    Culture  Setup Time   Final    GRAM POSITIVE COCCI ANAEROBIC BOTTLE ONLY CRITICAL RESULT CALLED TO, READ BACK BY AND VERIFIED WITH: Breniya Goertzen BESANTI AT 9528 03/30/17.PMH    Culture (A)  Final    STAPHYLOCOCCUS SPECIES (COAGULASE NEGATIVE) THE SIGNIFICANCE OF ISOLATING THIS ORGANISM FROM A SINGLE SET OF BLOOD CULTURES WHEN MULTIPLE SETS ARE DRAWN IS UNCERTAIN. PLEASE NOTIFY THE MICROBIOLOGY DEPARTMENT WITHIN ONE WEEK IF SPECIATION AND SENSITIVITIES ARE REQUIRED. Performed at [facility], [place] [address] [place], [place] [address]    Report Status 04/01/2017 FINAL  Final  Blood Culture ID Panel (Reflexed)     Status: Abnormal   Collection Time: 03/28/17  8:35 PM  Result Value Ref Range Status   Enterococcus species NOT DETECTED NOT DETECTED Final   Listeria monocytogenes NOT DETECTED NOT DETECTED Final   Staphylococcus species DETECTED (A) NOT DETECTED Final    Comment: Methicillin (oxacillin) susceptible coagulase negative staphylococcus. Possible blood culture contaminant (unless isolated from more than one blood culture draw or clinical case suggests pathogenicity). No antibiotic treatment is indicated for blood  culture contaminants. CRITICAL RESULT CALLED TO, READ BACK BY AND VERIFIED  WITH: Quavion Boule BESANTI AT 4010 03/30/17.PMH    Staphylococcus aureus NOT DETECTED NOT DETECTED Final   Methicillin resistance NOT DETECTED NOT DETECTED Final   Streptococcus species NOT DETECTED NOT DETECTED Final   Streptococcus agalactiae NOT DETECTED NOT DETECTED Final   Streptococcus pneumoniae NOT DETECTED NOT DETECTED Final   Streptococcus pyogenes NOT DETECTED NOT DETECTED Final   Acinetobacter baumannii NOT DETECTED NOT DETECTED Final   Enterobacteriaceae species NOT DETECTED NOT DETECTED Final   Enterobacter cloacae complex NOT DETECTED NOT DETECTED Final   Escherichia coli NOT DETECTED NOT DETECTED Final   Klebsiella oxytoca NOT DETECTED NOT DETECTED Final   Klebsiella pneumoniae NOT DETECTED NOT DETECTED Final   Proteus species NOT DETECTED NOT DETECTED Final   Serratia marcescens NOT DETECTED NOT DETECTED Final   Haemophilus influenzae NOT DETECTED NOT DETECTED Final   Neisseria meningitidis NOT DETECTED NOT DETECTED Final   Pseudomonas aeruginosa NOT DETECTED NOT DETECTED Final   Candida albicans NOT DETECTED NOT DETECTED Final   Candida glabrata NOT DETECTED NOT DETECTED Final   Candida krusei NOT DETECTED NOT DETECTED Final   Candida parapsilosis NOT DETECTED NOT DETECTED Final   Candida tropicalis NOT DETECTED NOT DETECTED Final    Comment: Performed at [facility], [address] [place], [place] [address]  Culture, blood (routine x 2)     Status: None   Collection Time: 03/28/17  8:50 PM  Result Value Ref Range Status   Specimen Description BLOOD RIGHT ANTECUBITAL  Final   Special Requests   Final    BOTTLES DRAWN AEROBIC AND ANAEROBIC Blood Culture adequate volume   Culture   Final    NO GROWTH 5 DAYS Performed at [facility], [address], [place], [place] [address]    Report Status 04/02/2017 FINAL  Final  Body fluid culture     Status: None   Collection Time: 03/29/17 11:31 AM  Result Value Ref Range Status   Specimen Description  Final    PLEURAL Performed at [facility], [address] [place], [place] [address]    Special Requests   Final    NONE Performed at [facility], [place], [place] [address]    Gram Stain   Final    ABUNDANT WBC PRESENT,BOTH PMN AND MONONUCLEAR NO ORGANISMS SEEN    Culture   Final    NO GROWTH 3 DAYS Performed at [facility], [place] [address] [place], [place] [address]    Report Status 04/02/2017 FINAL  Final  MRSA PCR Screening     Status: None   Collection Time: 04/02/17  1:47 PM  Result Value Ref Range Status   MRSA by PCR NEGATIVE NEGATIVE Final    Comment:        The GeneXpert MRSA Assay (FDA approved for NASAL specimens only), is one component of a comprehensive MRSA colonization surveillance program. It is not intended to diagnose MRSA infection nor to guide or monitor treatment for MRSA infections. Performed at [facility], [address], [place], [place] [address]   Aerobic/Anaerobic Culture (surgical/deep wound)     Status: None (Preliminary result)   Collection Time: 04/03/17  2:33 PM  Result Value Ref Range Status   Specimen Description   Final    TISSUE PLEURAL PEEL Performed at [facility], [address], [place], [place] [address]    Special Requests   Final    NONE Performed at [facility], [address] [place], [place] [address]    Gram Stain   Final    ABUNDANT WBC PRESENT, PREDOMINANTLY PMN NO ORGANISMS SEEN    Culture   Final    NO GROWTH 2 DAYS NO ANAEROBES ISOLATED; CULTURE IN PROGRESS FOR 5 DAYS Performed at [facility] [address], [place], [place] [address]    Report Status PENDING  Incomplete  Acid Fast Smear (AFB)     Status: None   Collection Time: 04/03/17  2:33 PM  Result Value Ref Range Status   AFB Specimen Processing Comment  Final    Comment: Tissue Grinding and Digestion/Decontamination   Acid Fast Smear Negative  Final    Comment:  (NOTE) Performed At: [facility] [place], [place] [id number] Rush Farmer MD [id number]    Source (AFB) TISSUE  Final    Comment: PLEURAL PEEL Performed at [facility], [address] [place], [place] [address]   Aerobic/Anaerobic Culture (surgical/deep wound)     Status: None (Preliminary result)   Collection Time: 04/03/17  3:49 PM  Result Value Ref Range Status   Specimen Description   Final    LUNG RIGHT LOWER RIGHT LOWER LOBE OF LUNG Performed at [facility], [address], [place], [place] [address]    Special Requests   Final    NONE Performed at [facility], [address] [place], [place] [address]    Gram Stain NO WBC SEEN RARE GRAM POSITIVE COCCI IN PAIRS   Final   Culture   Final    NO GROWTH 3 DAYS NO ANAEROBES ISOLATED; CULTURE IN PROGRESS FOR 5 DAYS Performed at [facility], [place] [address] [place], [place] [address]    Report Status PENDING  Incomplete  Acid Fast Smear (AFB)     Status: None   Collection Time: 04/03/17  3:49 PM  Result Value Ref Range Status   AFB Specimen Processing Concentration  Final  Acid Fast Smear Negative  Final    Comment: (NOTE) Performed At: [facility] [place], [place] [id number] Rush Farmer MD [id number]    Source (AFB) LUNG  Final    Comment: RIGHT LOWER LOBE OF LUNG Performed at [facility], [address] [place], [place] [address]     IMAGING: Dg Chest 1 View  Result Date: 04/03/2017 CLINICAL DATA:  Dyspnea. EXAM: CHEST 1 VIEW COMPARISON:  04/02/2017 FINDINGS: Right chest tube again noted. No pneumothorax. Pleural-parenchymal opacity at the right base is similar with persistent right pleural effusion. Volume loss right hemithorax similar to prior. Left lung clear. The cardiopericardial silhouette is within normal limits for size. The visualized bony structures of the thorax are intact. IMPRESSION: Stable  exam with right chest tube in place. Basilar pleural-parenchymal opacity with pleural effusion. Electronically Signed   By: Misty Stanley M.D.   On: 04/03/2017 12:12   Dg Chest 1 View  Result Date: 04/02/2017 CLINICAL DATA:  Empyema.  Chest tube in place. EXAM: CHEST 1 VIEW COMPARISON:  04/01/2017 and 03/31/2017 and CT scan dated 03/30/2017 FINDINGS: Right chest tube remains in place. There is persistent loculated air and fluid at the right lung base primarily laterally. There is slightly improved aeration at the right lung base since the prior study. Left lung is clear. Heart size and vascularity are normal. Bones are normal. IMPRESSION: 1. Slightly improved aeration at the right lung base laterally. 2. No other change. Electronically Signed   By: Lorriane Shire M.D.   On: 04/02/2017 14:38   Dg Chest 2 View  Result Date: 03/28/2017 CLINICAL DATA:  Patient reports SOB onset today. No known heart or lung conditions. Current smoker. EXAM: CHEST  2 VIEW COMPARISON:  Chest CT 03/02/2017, chest radiograph 03/04/2017 FINDINGS: Normal cardiac silhouette. There is continued increase in fluid within the RIGHT hemithorax. The RIGHT hemithorax is near completely opacified (95%). This is increased significantly from comparison chest radiograph. There is mild mass effect upon the mediastinum. IMPRESSION: 1. Increasing pleural fluid in the RIGHT hemithorax with near complete opacification of the RIGHT hemithorax. This is increased significantly from 03/04/2017 where patient was found to have pneumonia on chest CT. 2. Mild shift of the mediastinum by the increasing pleural fluid. 3. Consider thoracentesis with cytology to evaluate for pulmonary malignancy. Electronically Signed   By: Suzy Bouchard M.D.   On: 03/28/2017 19:11   Ct Chest Wo Contrast  Result Date: 03/30/2017 CLINICAL DATA:  57 year old male with history of empyema. Followup study. EXAM: CT CHEST WITHOUT CONTRAST TECHNIQUE: Multidetector CT imaging of  the chest was performed following the standard protocol without IV contrast. COMPARISON:  Chest CT 03/28/2017. FINDINGS: Cardiovascular: Heart size is normal. There is no significant pericardial fluid, thickening or pericardial calcification. There is aortic atherosclerosis, as well as atherosclerosis of the great vessels of the mediastinum and the coronary arteries, including calcified atherosclerotic plaque in the left anterior descending, left circumflex and right coronary arteries. Mediastinum/Nodes: No pathologically enlarged mediastinal or hilar lymph nodes. Please note that accurate exclusion of hilar adenopathy is limited on noncontrast CT scans. Esophagus is unremarkable in appearance. No axillary lymphadenopathy. Lungs/Pleura: There has been interval placement of a right-sided chest tube with tip near the apex of the right hemithorax. Previously noted large right empyema has significantly decreased in size. There continues to be multiple internal loculations within this complex gas and fluid collection in the right hemithorax. Some re-expansion of the right lung is evident, although there  continues to be considerable atelectasis in portions of the right middle and lower lobe, as well as extensive residual airspace consolidation in the right lower lobe. Left lung remains clear. No left pleural effusion. Upper Abdomen: Aortic atherosclerosis. Musculoskeletal: There are no aggressive appearing lytic or blastic lesions noted in the visualized portions of the skeleton. IMPRESSION: 1. Decreasing size of what remains a large right empyema following chest tube placement, with some re-expansion of the right lung. Considerable residual atelectasis and airspace consolidation is noted, as discussed above. 2. Aortic atherosclerosis, in addition to 3 vessel coronary artery disease. Please note that although the presence of coronary artery calcium documents the presence of coronary artery disease, the severity of this  disease and any potential stenosis cannot be assessed on this non-gated CT examination. Assessment for potential risk factor modification, dietary therapy or pharmacologic therapy may be warranted, if clinically indicated. Aortic Atherosclerosis (ICD10-I70.0). Electronically Signed   By: Vinnie Langton M.D.   On: 03/30/2017 10:12   Ct Chest Wo Contrast  Result Date: 03/28/2017 CLINICAL DATA:  57 year old male recently treated for pneumonia but failed to improve. EXAM: CT CHEST WITHOUT CONTRAST TECHNIQUE: Multidetector CT imaging of the chest was performed following the standard protocol without IV contrast. COMPARISON:  Chest CT 03/02/2017. FINDINGS: Cardiovascular: Heart size is normal. There is no significant pericardial fluid, thickening or pericardial calcification. There is aortic atherosclerosis, as well as atherosclerosis of the great vessels of the mediastinum and the coronary arteries, including calcified atherosclerotic plaque in the left anterior descending, left circumflex and right coronary arteries. Mediastinum/Nodes: No pathologically enlarged mediastinal or hilar lymph nodes. Please note that accurate exclusion of hilar adenopathy is limited on noncontrast CT scans. Esophagus is unremarkable in appearance. No axillary lymphadenopathy. Lungs/Pleura: Massive right-sided pleural effusion with nondependent gas significantly increased in size compared to the prior study, compatible with an empyema. This is associated with extensive passive atelectasis throughout the right lung, with evidence of persistent consolidation portions of the right lower lobe. Left lung is clear. No pleural effusions. Upper Abdomen: Aortic atherosclerosis. Musculoskeletal: There are no aggressive appearing lytic or blastic lesions noted in the visualized portions of the skeleton. IMPRESSION: 1. Massive right-sided empyema with near complete passive atelectasis of the right lung. Residual airspace consolidation in the right  lower lobe, compatible with persistent pneumonia. 2. Aortic atherosclerosis, in addition to three-vessel coronary artery disease. Please note that although the presence of coronary artery calcium documents the presence of coronary artery disease, the severity of this disease and any potential stenosis cannot be assessed on this non-gated CT examination. Assessment for potential risk factor modification, dietary therapy or pharmacologic therapy may be warranted, if clinically indicated. Aortic Atherosclerosis (ICD10-I70.0). Electronically Signed   By: Vinnie Langton M.D.   On: 03/28/2017 19:42   Dg Chest Port 1 View  Result Date: 04/06/2017 CLINICAL DATA:  Follow-up empyema EXAM: PORTABLE CHEST 1 VIEW COMPARISON:  04/05/2017 FINDINGS: Multiple chest tubes are again noted on the right and stable. No pneumothorax is noted. Residual pleural fluid is noted laterally unchanged from the prior exam. Right basilar atelectasis is noted. The left lung is well aerated without focal abnormality. Cardiac shadow is stable. IMPRESSION: No evidence of pneumothorax. Stable right-sided effusion with multiple chest tubes in place. Electronically Signed   By: Inez Catalina M.D.   On: 04/06/2017 07:24   Dg Chest Port 1 View  Result Date: 04/05/2017 CLINICAL DATA:  Patient status post right thoracotomy and decortication for a right lower lobe abscess  empyema 04/03/2017. EXAM: PORTABLE CHEST 1 VIEW COMPARISON:  Single-view of the chest 04/04/2017. FINDINGS: Four right chest tubes remain in place. Pleural and parenchymal opacities in the right mid and lower lung zones are unchanged. Tiny right pneumothorax is unchanged. The left lung is expanded and clear. Heart size is upper normal. Aortic atherosclerosis is seen. IMPRESSION: Tiny right pneumothorax with chest tubes in place. No change in a small right pleural effusion or pleural thickening and basilar atelectasis. Electronically Signed   By: Inge Rise M.D.   On: 04/05/2017  10:29   Dg Chest Port 1 View  Result Date: 04/04/2017 CLINICAL DATA:  Respiratory failure. EXAM: PORTABLE CHEST 1 VIEW COMPARISON:  04/03/2017. FINDINGS: Multiple right chest tubes again noted in stable position. Tiny right-sided pneumothorax is unchanged. Mild right chest wall subcutaneous emphysema. Small right pleural effusion again noted mild atelectatic changes right lung base. Left lung is clear. Stable cardiomegaly with normal pulmonary vascularity. Chest appears stable from prior exam. IMPRESSION: 1. Chest appears stable from prior exam. Multiple right chest tubes again noted with small right pneumothorax and mild right chest wall subcutaneous emphysema small right pleural effusion again noted. Atelectatic changes in the right lung base again noted. 2.  Stable cardiomegaly. Electronically Signed   By: Marcello Moores  Register   On: 04/04/2017 08:53   Dg Chest Port 1 View  Result Date: 04/03/2017 CLINICAL DATA:  Status post bronchoscopy and thoracotomy EXAM: PORTABLE CHEST 1 VIEW COMPARISON:  04/03/2017, 04/02/2017, 03/30/2017 chest CT FINDINGS: Interim placement of multiple right chest drainage catheters, 3 of the tips overlie the right lung apex, single tip projects over the right upper quadrant. Cutaneous staples and small amount of subcutaneous emphysema over the right lateral chest wall. Decreased right pleural disease. Small right lateral pneumothorax. No midline shift. Left lung grossly clear. Stable cardiomediastinal silhouette. Improved aeration of the right lung base. IMPRESSION: Placement of multiple right-sided chest drainage catheters with decreased pleural disease. Trace right lateral pneumothorax. Improved aeration of right lung base with residual hazy infiltrate in the region. Cutaneous staples over the right lower lateral chest with small amount of subcutaneous emphysema. Electronically Signed   By: Donavan Foil M.D.   On: 04/03/2017 20:51   Dg Chest Port 1 View  Result Date:  04/01/2017 CLINICAL DATA:  Follow-up right chest tube positioning EXAM: PORTABLE CHEST 1 VIEW COMPARISON:  Chest x-ray of March 31, 2017 FINDINGS: The right chest tube tip projects over the posteromedial aspect of the fourth rib. There remains a small to moderate-sized right pleural effusion. There is no pneumothorax. There is right basilar parenchymal density. The mediastinum is not shifted. The left lung is well-expanded and clear. There is stable left apical pleural thickening. The heart and pulmonary vascularity are normal. The mediastinum is normal in width. The observed bony thorax is unremarkable. IMPRESSION: Stable appearance of the moderate-sized right pleural effusion/empyema. Persistent right basilar atelectasis or pneumonia. No pneumothorax. Stable positioning of the right-sided chest tube. Electronically Signed   By: Jakson Delpilar  [place] M.D.   On: 04/01/2017 07:42   Dg Chest Port 1 View  Result Date: 03/31/2017 CLINICAL DATA:  57 year old male with history of pneumonia and empyema. Follow-up study. EXAM: PORTABLE CHEST 1 VIEW COMPARISON:  Chest x-ray 03/30/2017. FINDINGS: Previously noted right-sided chest tube remains stable in position with tip near the apex of the right hemithorax. Complex right pleural fluid and gas collection (empyema) appears slightly decreased in size compared to the prior study. Continued improved aeration throughout the right lung compatible  with resolving areas of atelectasis and consolidation. Left lung remains clear. No left pleural effusion. No evidence of pulmonary edema. Heart size is normal. Upper mediastinal contours are within normal limits. IMPRESSION: 1. Decreasing size of right-sided empyema with continued re-expansion of the right lung and resolving areas of atelectasis and consolidation, as above. Electronically Signed   By: Vinnie Langton M.D.   On: 03/31/2017 07:22   Dg Chest Port 1 View  Result Date: 03/30/2017 CLINICAL DATA:  56 year old male under  evaluation for chest tube placement. EXAM: PORTABLE CHEST 1 VIEW COMPARISON:  Chest x-ray 03/29/2017. FINDINGS: Previously noted right-sided chest tube remains in a similar position with tip near the apex of the right hemithorax. Previously noted right-sided hydropneumothorax has decreased in size, now with only a trace residual pneumothorax component and decreasing moderate-sized fluid component which appears partially loculated in the lower lateral right hemithorax. Areas of atelectasis and/or consolidation throughout the right mid to lower lung persist, although aeration appears slightly improved. Left lung is well aerated. No left pleural effusion. No evidence of pulmonary edema. Heart size is normal. Upper mediastinal contours are within normal limits. IMPRESSION: 1. Stable position of right-sided chest tube with slight decreased size of right-sided empyema. Electronically Signed   By: Vinnie Langton M.D.   On: 03/30/2017 07:30   Dg Chest Port 1 View  Result Date: 03/29/2017 CLINICAL DATA:  Chest tube placement EXAM: PORTABLE CHEST 1 VIEW COMPARISON:  03/28/2017 FINDINGS: Interval placement of large bore chest tube on the right in good position. Significant improvement in right effusion. There remains moderate amount of loculated pleural fluid in the lateral base. 15 mm pneumothorax in the lateral apex. Partial re-expansion of the right upper lobe and a portion of the right lower lobe. Underlying pneumonia is suspected. Tumor not excluded. Left lung remains clear.  No heart failure.  Heart size normal. IMPRESSION: Interval placement of right chest tube with marked improvement in large right effusion. There remains a moderate loculated effusion in the right lateral base. 15 mm right lateral apex pneumothorax Re-expansion of right upper lobe. Partial re-expansion right lower lobe with underlying infiltrate present based on prior studies. Electronically Signed   By: Franchot Gallo M.D.   On: 03/29/2017 13:39     Assessment:   ROCHESTER SERPE is a 57 y.o. male with hx of heavy etoh and tobacco use admitted 3 weeks after being discharged on augmentin for PNA and found to have extensive empyema. Now s/p chest tube and then definitive decortication on 1/24. All cultures negative. This is most likely aspiration PNA and usual pathogens include the oral streps and anaerobes.  Will need prolonged abx course however since no resistant organisms can treat with oral antibiotics.  MRSA PCR negative.  Hiv neg, AFB negative to date.   Recommendations Can treat with a 6 week course of augmentin and follow with me and with Dr Genevive Bi who will manage his chest tubes. Will usually treat until all chest tubes are removed and for 2 weeks after.  Thank you very much for allowing me to participate in the care of this patient. Please call with questions.   Cheral Marker. Ola Spurr, MD

## 2017-04-08 ENCOUNTER — Inpatient Hospital Stay: Payer: 59

## 2017-04-08 ENCOUNTER — Inpatient Hospital Stay (HOSPITAL_COMMUNITY)
Admit: 2017-04-08 | Discharge: 2017-04-08 | Disposition: A | Discharge status: Home / Self Care | Payer: 59 | Attending: Internal Medicine | Admitting: Internal Medicine

## 2017-04-08 ENCOUNTER — Encounter: Payer: Self-pay | Admitting: Internal Medicine

## 2017-04-08 DIAGNOSIS — I4891 Unspecified atrial fibrillation: Secondary | ICD-10-CM

## 2017-04-08 DIAGNOSIS — I480 Paroxysmal atrial fibrillation: Secondary | ICD-10-CM

## 2017-04-08 LAB — T4, FREE: Free T4: 1.3 ng/dL — ABNORMAL HIGH (ref 0.61–1.12)

## 2017-04-08 LAB — TSH: TSH: 4.072 u[IU]/mL (ref 0.350–4.500)

## 2017-04-08 LAB — ECHOCARDIOGRAM COMPLETE
Height: 71 in
WEIGHTICAEL: 2059.98 [oz_av]

## 2017-04-08 LAB — AEROBIC/ANAEROBIC CULTURE W GRAM STAIN (SURGICAL/DEEP WOUND): Culture: NO GROWTH

## 2017-04-08 LAB — AEROBIC/ANAEROBIC CULTURE (SURGICAL/DEEP WOUND): GRAM STAIN: NONE SEEN

## 2017-04-08 LAB — HEMOGLOBIN A1C
Hgb A1c MFr Bld: 6.1 % — ABNORMAL HIGH (ref 4.8–5.6)
Mean Plasma Glucose: 128.37 mg/dL

## 2017-04-08 MED ORDER — FLEET ENEMA 7-19 GM/118ML RE ENEM
1.0000 | ENEMA | Freq: Once | RECTAL | Status: AC
  Administered 2017-04-08: 1 via RECTAL

## 2017-04-08 MED ORDER — ENSURE ENLIVE PO LIQD
237.0000 mL | Freq: Three times a day (TID) | ORAL | Status: DC
  Administered 2017-04-08 – 2017-04-11 (×5): 237 mL via ORAL

## 2017-04-08 NOTE — Progress Notes (Signed)
[place] [facility] [place] NAME: Kristopher [name]    MR#:  [id number]  DATE OF BIRTH:  11/05/1960  SUBJECTIVE:   The patient has no complaints.  He got amiodarone bolus IV and converted to NSR. REVIEW OF SYSTEMS:    Review of Systems  Constitutional: Negative for fever, chills weight loss Positive for generalized weakness and fatigue HENT: Negative for ear pain, nosebleeds, congestion, facial swelling, rhinorrhea, neck pain, neck stiffness and ear discharge.   Respiratory: no shortness of breath or cough Cardiovascular: Negative for chest pain, palpitations and leg swelling.  Gastrointestinal: Negative for heartburn, abdominal pain, vomiting, diarrhea or consitpation Genitourinary: Negative for dysuria, urgency, frequency, hematuria Musculoskeletal: Negative for back pain or joint pain Neurological: Negative for dizziness, seizures, syncope, focal weakness,  numbness and headaches.  Hematological: Does not bruise/bleed easily.  Psychiatric/Behavioral: Negative for hallucinations, confusion, dysphoric mood  Tolerating Diet: yes   DRUG ALLERGIES:   Allergies  Allergen Reactions  . Percocet [Oxycodone-Acetaminophen] Itching    VITALS:  Blood pressure (!) 140/123, pulse 100, temperature 98.3 F (36.8 C), temperature source Oral, resp. rate 20, height 5\' 11"  (1.803 m), weight 129 lb 3 oz (58.6 kg), SpO2 (!) 87 %.  PHYSICAL EXAMINATION:  Constitutional: Appears thin. No distress. HENT: Normocephalic. Marland Kitchen Oropharynx is clear and moist.  Eyes: Conjunctivae and EOM are normal. PERRLA, no scleral icterus.  Neck: Normal ROM. Neck supple. No JVD. No tracheal deviation. CVS: Irregular rate and rhythm, tachycardia, no murmurs, no gallops, no carotid bruit.  Pulmonary: Chest tube placed with decreased breath sounds right upper lobe . Abdominal: Soft. BS +,  no distension, tenderness, rebound or guarding.  Musculoskeletal: Normal range of motion. No edema and  no tenderness.  Neuro: Alert. CN 2-12 grossly intact. No focal deficits. Skin: Skin is warm and dry. No rash noted. Psychiatric: Normal mood and affect.    LABORATORY PANEL:   CBC Recent Labs  Lab 04/07/17 0513  WBC 8.2  HGB 8.4*  HCT 25.1*  PLT 676*   ------------------------------------------------------------------------------------------------------------------  Chemistries  Recent Labs  Lab 04/07/17 0513  NA 134*  K 3.8  CL 94*  CO2 33*  GLUCOSE 95  BUN 5*  CREATININE 0.37*  CALCIUM 8.5*  MG 1.9   ------------------------------------------------------------------------------------------------------------------  Cardiac Enzymes No results for input(s): TROPONINI in the last 168 hours. ------------------------------------------------------------------------------------------------------------------  RADIOLOGY:  Dg Chest Port 1 View  Result Date: 04/08/2017 CLINICAL DATA:  Respiratory failure.  Decortication EXAM: PORTABLE CHEST 1 VIEW COMPARISON:  Two days ago FINDINGS: Multiple right-sided chest tubes tips at the base and apex. Small lateral right pneumothorax, stable. Pleural thickening/loculated fluid that is stable. Incomplete expansion of the right lung. Left chest is clear with stable left apical pleural based thickening. Normal heart size and mediastinal contours. IMPRESSION: 1. Trace pneumothorax on the right. 2. No evidence of increasing pleural fluid. Electronically Signed   By: Monte Fantasia M.D.   On: 04/08/2017 07:02     ASSESSMENT AND PLAN:   57 year old male with EtOH abuse who presents with shortness of breath and found to have large empyema and CT scan  1. Tension Empyema/right lateral apex pneumothorax with reexpansion of right upper lobe: Patient is status post chest tube placement.  require decortication for empyema - 04/03/17.  Initial cultures thus far are negative. Follow-up on final cultures from empyema  Toradol for pain He is on  Zosyn, changed to augmentin. Per Dr. Genevive Bi,  Will leave on suction for 7 days  postop and then convert to "empyema tubes". Would recommend IV antibiotics for 6 weeeks given the extent of his necrotizing pneumonia. Changed abx to Augmentin and will need prolonged course (4 weeks) per Dr. Alva Garnet. Dr. Ola Spurr, can treat with a 6 week course of augmentin and follow with me and with Dr Genevive Bi who will manage his chest tubes.  2. Hyponatremia: This is due to empyema and dehydration, improving. Follow sodium levels  3. EtOH abuse: Uneventful detox so far  on CIWA protocol  4. Severe protein calorie malnutrition: Dietary consultation requested and appreciated. Continue dietary supplement  5. One out of 2 gram-positive cocci staph species which is contaminant  6. Thrombocytosis    Likely reactive to infections.  New onset A. fib.  Converted to normal sinus rhythm on IV amiodarone.  Changed to p.o. amiodarone per Dr. Alva Garnet.  Per Dr. Saunders Revel,  It is reasonable to continue amiodarone in the acute setting, though I do not believe this needs to be maintained long-term, avoidance of anticoagulation at this time.  I discussed with Dr. Genevive Bi. Management plans discussed with the patient and he is in agreement.  CODE STATUS: FULL  TOTAL TIME TAKING CARE OF THIS PATIENT: 28 minutes.   PT recommends home with home health upon discharge.  POSSIBLE D/C 2 days, DEPENDING ON CLINICAL CONDITION.   Demetrios Loll M.D on 04/08/2017 at 4:38 PM  Between 7am to 6pm - Pager - [phone] After 6pm go to [email or web address] - password [email or web address] [facility]  Office  [phone]  CC: Primary care physician; Patient, No Pcp Per  Note: This dictation was prepared with Dragon dictation along with smaller phrase technology. Any transcriptional errors that result from this process are unintentional.

## 2017-04-08 NOTE — Progress Notes (Signed)
Not short of breath.  Pain better.  All wounds redressed.  Clean and dry.  No air leak.  CXRay from today looks good.  May be on water seal to ambulate in halls.  Continue antibiotics.  [employer].

## 2017-04-08 NOTE — Progress Notes (Signed)
*  PRELIMINARY RESULTS* Echocardiogram 2D Echocardiogram has been performed.  Wallie Char Tiann Saha 04/08/2017, 11:49 AM

## 2017-04-08 NOTE — Progress Notes (Addendum)
Nutrition Follow Up Note   DOCUMENTATION CODES:   Severe malnutrition in context of chronic illness, Underweight  INTERVENTION:   Continue Ensure Enlive po TID, each supplement provides 350 kcal and 20 grams of protein.  Add Magic cup TID with meals, each supplement provides 290 kcal and 9 grams of protein  Continue MVI daily, folic acid 1 mg daily, thiamine 100 mg daily in setting of EtOH abuse.  Bowel regimen as needed per MD  NUTRITION DIAGNOSIS:   Severe Malnutrition related to chronic illness(EtOH abuse, recurrent necrotizing PNA with empyema) as evidenced by severe fat depletion, severe muscle depletion.  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   S/p preoperative bronchoscopy with right thoracotomy and decortication of the visceral and parietal pleura with intra-pleural drainage of lung abscess 1/24.   Pt doing well; eating 75-100% of meals and drinking some Ensure. No new weight since 1/19; will request new weight. Chest tube with 161ml output x 24 hrs. Per MD note "will leave on suction for 7 days postop and then convert to "empyema tubes". No BM since 1/24; recommend bowel regimen as needed per MD.   Medications reviewed and include: augmentin, dulcolax, folic acid, MVI, thiamine, oxycodone  Labs reviewed: Na 134(L), K 3.8 wnl, Cl 94(L), BUN 5(L), creat 0.37(L), Ca 8.5(L), P 3.8 wnl, Mg 1.9 wnl- 1/28 Hgb 8.4(L), Hct 25.1(L)- 1/28  Diet Order:  Diet regular Room service appropriate? Yes; Fluid consistency: Thin  EDUCATION NEEDS:   Education needs have been addressed  Skin:  Incision chest  Last BM:   1/24  Height:   Ht Readings from Last 1 Encounters:  03/29/17 5\' 11"  (1.803 m)    Weight:   Wt Readings from Last 1 Encounters:  03/29/17 128 lb 12 oz (58.4 kg)    Ideal Body Weight:  78.2 kg  BMI:  Body mass index is 17.96 kg/m.  Estimated Nutritional Needs:   Kcal:  0175-1025 (MSJ x 1.3-1.5)  Protein:  85-100 grams (1.5-1.7 grams/kg)  Fluid:  1.7-2 L/day (30-35 mL/kg)  Koleen Distance MS, RD, LDN Pager #- [phone] After Hours Pager: [phone]

## 2017-04-08 NOTE — Consult Note (Signed)
Cardiology Consultation:   Patient ID: Kristopher [name]; [id number]; 1960/07/24   Admit date: 03/28/2017 Date of Consult: 04/08/2017  Primary Care Provider: Patient, No Pcp Per Primary Cardiologist: None - new consult by Elara Cocke Primary Electrophysiologist:  None   Patient Profile:   Kristopher [name] is a 57 y.o. male with a hx of pneumonia complicated by recent empyema who is being seen today for the evaluation of atrial fibrillation at the request of Dr. Bridgett Larsson.  History of Present Illness:   Kristopher [name] began noticing shortness of breath and cough in mid December and was subsequently hospitalized on 03/01/17 with pneumonia after failing outpatient treatment with moxifloxacin. He was also hyponatremic at that time.he was treated with IV antibiotics and discharged on Augmentin with outpatient follow-up with infectious disease. Due to continued shortness of breath and generalized malaise, he was readmitted on 03/28/17. Workup revealed right-sided empyema for which Kristopher [name] underwent right chest tube placement and subsequent bronchoscopy and right thoracotomy with decortication and pleural drainage of lung abscess. His postoperative course was complicated by atrial fibrillation with rapid ventricular response. He was started on amiodarone with subsequent conversion to sinus rhythm 2 days ago.  Kristopher [name] reports a long history of intermittent palpitations for several years. He has always attributed this to atrial fibrillation, as his mother also had atrial fibrillation. He has never been evaluated by a cardiologist or sought treatment for his palpitations. Aside from tenderness at his chest tube sites, Kristopher [name] denies chest pain. He also denies shortness of breath at this time. He notes that he has lost about 25 pounds since his respiratory problems began just over a month ago. He stopped smoking in December. He continues to drink up to 6 beers per day, though his chart reveals heavier drinking in the past.  He  denies orthopnea, PND, and edema.  Past Medical History:  Diagnosis Date  . Electric injury   . PNA (pneumonia)     Past Surgical History:  Procedure Laterality Date  . DECORTICATION N/A 04/03/2017   Procedure: DECORTICATION;  Surgeon: Nestor Lewandowsky, MD;  Location: [facility] ORS;  Service: Thoracic;  Laterality: N/A;  debridement of lung abscess  . SKIN GRAFT    . THORACOTOMY Right 04/03/2017   Procedure: THORACOTOMY MAJOR;  Surgeon: Nestor Lewandowsky, MD;  Location: [facility] ORS;  Service: Thoracic;  Laterality: Right;  Marland Kitchen VIDEO BRONCHOSCOPY N/A 04/03/2017   Procedure: PREOP BRONCHOSCOPY;  Surgeon: Nestor Lewandowsky, MD;  Location: [facility] ORS;  Service: Thoracic;  Laterality: N/A;     Home Medications:  Prior to Admission medications   Medication Sig Start Date Marcell Pfeifer Date Taking? Authorizing Provider  amoxicillin-clavulanate (AUGMENTIN) 875-125 MG tablet Take 1 tablet by mouth 2 (two) times daily. X 14 days Patient not taking: Reported on 03/28/2017 03/04/17   Gladstone Lighter, MD  benzonatate (TESSALON) 200 MG capsule Take 1 capsule (200 mg total) by mouth 3 (three) times daily. Patient not taking: Reported on 03/28/2017 03/04/17   Gladstone Lighter, MD  [facility] HFA 108 [phone] Base) MCG/ACT inhaler Inhale 2 puffs into the lungs every 6 (six) hours as needed. 02/24/17   [provider]    Inpatient Medications: Scheduled Meds: . amiodarone  400 mg Oral BID  . amoxicillin-clavulanate  1 tablet Oral BID  . bisacodyl  10 mg Oral Daily  . feeding supplement (ENSURE ENLIVE)  237 mL Oral TID BM  . folic acid  1 mg Oral Daily  . multivitamin with minerals  1 tablet Oral Daily  .  polyethylene glycol  17 g Oral Daily  . thiamine  100 mg Oral Daily   Continuous Infusions:  PRN Meds: albuterol, magnesium hydroxide, morphine injection, ondansetron (ZOFRAN) IV, oxyCODONE-acetaminophen  Allergies:    Allergies  Allergen Reactions  . Percocet [Oxycodone-Acetaminophen] Itching    Social History:     Social History   Socioeconomic History  . Marital status: Married    Spouse name: Not on file  . Number of children: Not on file  . Years of education: Not on file  . Highest education level: Not on file  Social Needs  . Financial resource strain: Somewhat hard  . Food insecurity - worry: Patient refused  . Food insecurity - inability: Patient refused  . Transportation needs - medical: Patient refused  . Transportation needs - non-medical: Patient refused  Occupational History  . Not on file  Tobacco Use  . Smoking status: Former Research scientist (life sciences)  . Smokeless tobacco: Never Used  Substance and Sexual Activity  . Alcohol use: Yes  . Drug use: No  . Sexual activity: Not Currently  Other Topics Concern  . Not on file  Social History Narrative  . Not on file    Family History:   Family History  Problem Relation Age of Onset  . Atrial fibrillation Mother   . Dementia Mother      ROS:  A 12 system review of systems was performed and was negative except as noted in the history of present illness.  Physical Exam/Data:   Vitals:   04/07/17 1849 04/07/17 2038 04/08/17 0548 04/08/17 1037  BP: 133/80 140/88 114/80 104/71  Pulse: 77 77 81 84  Resp: 20 20    Temp: 98.1 F (36.7 C) 97.6 F (36.4 C) 98.2 F (36.8 C)   TempSrc: Oral Oral    SpO2: 98% 98% 96%   Weight:      Height:        Intake/Output Summary (Last 24 hours) at 04/08/2017 1122 Last data filed at 04/08/2017 1102 Gross per 24 hour  Intake 240 ml  Output 2780 ml  Net -2540 ml   Filed Weights   03/28/17 1825 03/29/17 0936  Weight: 130 lb (59 kg) 128 lb 12 oz (58.4 kg)   Body mass index is 17.96 kg/m.  General:  Cachectic man, seated in bed. HEENT: normal Lymph: no adenopathy Neck: no JVD Endocrine:  No thryomegaly Vascular: No carotid bruits; 2+ radial and pedal pulses bilaterally. Cardiac:  Regular rate and rhythm without murmurs, rubs, or gallops. Lungs: right-sided chest tubes in place. Absent breath  sounds at the right base. Otherwise, fair air movement without wheezes or crackles. Abd: scaphoid abdomen. Nontender without hepatosplenomegaly. Ext: no edema Musculoskeletal:  No deformities, BUE and BLE strength normal and equal Skin: warm and dry  Neuro:  CNs 2-12 intact, no focal abnormalities noted Psych:  Normal affect   EKG:  The EKG dated 04/06/17 was personally reviewed and demonstrates:  Atrial fibrillation with rapid ventricular response and nonspecific ST/T changes. Telemetry:  Telemetry was personally reviewed and demonstrates:  Normal sinus rhythm since noon on 04/06/17.  Relevant CV Studies: none  Laboratory Data:  Chemistry Recent Labs  Lab 04/04/17 0702 04/05/17 0453 04/07/17 0513  NA 128* 132* 134*  K 4.5 3.9 3.8  CL 92* 95* 94*  CO2 29 30 33*  GLUCOSE 139* 128* 95  BUN 8 <5* 5*  CREATININE 0.48* 0.34* 0.37*  CALCIUM 8.6* 8.4* 8.5*  GFRNONAA >60 >60 >60  GFRAA >60 >60 >60  ANIONGAP 7 7 7     No results for input(s): PROT, ALBUMIN, AST, ALT, ALKPHOS, BILITOT in the last 168 hours. Hematology Recent Labs  Lab 04/04/17 0702 04/05/17 0453 04/07/17 0513  WBC 12.8* 10.5 8.2  RBC 3.33* 3.03* 2.77*  HGB 10.2* 9.5* 8.4*  HCT 30.3* 27.4* 25.1*  MCV 91.0 90.4 90.6  MCH 30.6 31.4 30.5  MCHC 33.6 34.7 33.7  RDW 14.7* 14.3 14.4  PLT 693* 625* 676*   Cardiac EnzymesNo results for input(s): TROPONINI in the last 168 hours. No results for input(s): TROPIPOC in the last 168 hours.  BNPNo results for input(s): BNP, PROBNP in the last 168 hours.  DDimer No results for input(s): DDIMER in the last 168 hours.  Radiology/Studies:  Dg Chest Port 1 View  Result Date: 04/08/2017 CLINICAL DATA:  Respiratory failure.  Decortication EXAM: PORTABLE CHEST 1 VIEW COMPARISON:  Two days ago FINDINGS: Multiple right-sided chest tubes tips at the base and apex. Small lateral right pneumothorax, stable. Pleural thickening/loculated fluid that is stable. Incomplete expansion of the  right lung. Left chest is clear with stable left apical pleural based thickening. Normal heart size and mediastinal contours. IMPRESSION: 1. Trace pneumothorax on the right. 2. No evidence of increasing pleural fluid. Electronically Signed   By: Monte Fantasia M.D.   On: 04/08/2017 07:02   Dg Chest Port 1 View  Result Date: 04/06/2017 CLINICAL DATA:  Follow-up empyema EXAM: PORTABLE CHEST 1 VIEW COMPARISON:  04/05/2017 FINDINGS: Multiple chest tubes are again noted on the right and stable. No pneumothorax is noted. Residual pleural fluid is noted laterally unchanged from the prior exam. Right basilar atelectasis is noted. The left lung is well aerated without focal abnormality. Cardiac shadow is stable. IMPRESSION: No evidence of pneumothorax. Stable right-sided effusion with multiple chest tubes in place. Electronically Signed   By: Inez Catalina M.D.   On: 04/06/2017 07:24   Dg Chest Port 1 View  Result Date: 04/05/2017 CLINICAL DATA:  Patient status post right thoracotomy and decortication for a right lower lobe abscess empyema 04/03/2017. EXAM: PORTABLE CHEST 1 VIEW COMPARISON:  Single-view of the chest 04/04/2017. FINDINGS: Four right chest tubes remain in place. Pleural and parenchymal opacities in the right mid and lower lung zones are unchanged. Tiny right pneumothorax is unchanged. The left lung is expanded and clear. Heart size is upper normal. Aortic atherosclerosis is seen. IMPRESSION: Tiny right pneumothorax with chest tubes in place. No change in a small right pleural effusion or pleural thickening and basilar atelectasis. Electronically Signed   By: Inge Rise M.D.   On: 04/05/2017 10:29    Assessment and Plan:   Paroxysmal atrial fibrillation Patient was noted to have atrial fibrillation with rapid ventricular response in the setting of empyema status post decortication.  He has converted back to sinus rhythm with amiodarone.  His CHADSVASC score is 0.  In the setting of low risk  for stroke and worsening anemia, I recommend avoidance of anticoagulation at this time.  It is reasonable to continue amiodarone in the acute setting, though I do not believe this needs to be maintained long-term.  I recommend obtaining thyroid function studies and a transthoracic echocardiogram to evaluate for significant structural abnormalities.  If these studies are unrevealing, no further cardiac workup is necessary at this time.  Pneumonia/empyema Continue ongoing treatment per internal medicine, infectious disease, and thoracic surgery.  For questions or updates, please contact [place] Please consult [email or web address] for contact info under [facility] cardiology.  Signed, Nelva Bush, MD  04/08/2017 11:22 AM

## 2017-04-09 LAB — AEROBIC/ANAEROBIC CULTURE W GRAM STAIN (SURGICAL/DEEP WOUND): Culture: NO GROWTH

## 2017-04-09 LAB — AEROBIC/ANAEROBIC CULTURE (SURGICAL/DEEP WOUND)

## 2017-04-09 NOTE — Progress Notes (Signed)
Advance care planning  Discussed with patient regarding his weight loss, empyema and alcohol abuse. Patient mentions he has lost 25 pounds in the last 1 month with his acute illness.  Discussed regarding his treatment plan with chest tubes/empyema.  Has been advised quitting alcohol and that he should be able to regain his weight now that the empyema is improving.  We discussed regarding his healthcare power of attorney.  Patient does not have anybody designated but wants his daughter to make decisions for healthcare in case he is unable to.  But he wants to have resuscitation and intubation done if needed but only short-term.    Time spent 20 minutes

## 2017-04-09 NOTE — Progress Notes (Signed)
Dr cooper made aware of chest tube air leak (moderate).

## 2017-04-09 NOTE — Plan of Care (Signed)
Pt is progressing. Little output from chest tube today. Pt ambulating to restroom

## 2017-04-09 NOTE — Progress Notes (Signed)
[place] INFECTIOUS DISEASE PROGRESS NOTE Date of Admission:  03/28/2017     ID: NATALIE LECLAIRE is a 57 y.o. male with empyema Active Problems:   Empyema ([place])   Lung abscess ([place])   Subjective: No fevers, eating more, chest tubes to water seal   ROS  Eleven systems are reviewed and negative except per hpi  Medications:  Antibiotics Given (last 72 hours)    Date/Time Action Medication Dose Rate   04/06/17 2106 New Bag/Given   piperacillin-tazobactam (ZOSYN) IVPB 3.375 g 3.375 g 12.5 mL/hr   04/07/17 1194 New Bag/Given   piperacillin-tazobactam (ZOSYN) IVPB 3.375 g 3.375 g 12.5 mL/hr   04/07/17 1038 Given   amoxicillin-clavulanate (AUGMENTIN) 875-125 MG per tablet 1 tablet 1 tablet    04/07/17 2050 Given   amoxicillin-clavulanate (AUGMENTIN) 875-125 MG per tablet 1 tablet 1 tablet    04/08/17 1037 Given   amoxicillin-clavulanate (AUGMENTIN) 875-125 MG per tablet 1 tablet 1 tablet    04/08/17 2038 Given   amoxicillin-clavulanate (AUGMENTIN) 875-125 MG per tablet 1 tablet 1 tablet    04/09/17 0949 Given   amoxicillin-clavulanate (AUGMENTIN) 875-125 MG per tablet 1 tablet 1 tablet      . amiodarone  400 mg Oral BID  . amoxicillin-clavulanate  1 tablet Oral BID  . bisacodyl  10 mg Oral Daily  . feeding supplement (ENSURE ENLIVE)  237 mL Oral TID BM  . folic acid  1 mg Oral Daily  . multivitamin with minerals  1 tablet Oral Daily  . polyethylene glycol  17 g Oral Daily  . thiamine  100 mg Oral Daily    Objective: Vital signs in last 24 hours: Temp:  [98 F (36.7 C)-98.7 F (37.1 C)] 98 F (36.7 C) (01/30 1200) Pulse Rate:  [78-88] 81 (01/30 1200) Resp:  [17-21] 17 (01/30 1200) BP: (116-130)/(72-87) 128/87 (01/30 1200) SpO2:  [97 %-99 %] 98 % (01/30 1200) Constitutional: He is oriented to person, place, and time. Very thin, disheveled HENT: anicteric Mouth/Throat: Oropharynx is clear and dry . No oropharyngeal exudate.  Cardiovascular: Normal rate, regular rhythm  and normal heart sounds. Pulmonary/Chest: R side decreased BS, rhonchi and rub.CT with ss drainage Incision from decortication with sutures in place and no evidence infeciton   Abdominal: Soft. Bowel sounds are normal. He exhibits no distension. There is no tenderness.  Lymphadenopathy: He has no cervical adenopathy.  Neurological: He is alert and oriented to person, place, and time.  Skin: Skin is warm and dry. No rash noted. No erythema.  Psychiatric: He has a normal mood and affect. His behavior is normal.     Lab Results Recent Labs    04/07/17 0513  WBC 8.2  HGB 8.4*  HCT 25.1*  NA 134*  K 3.8  CL 94*  CO2 33*  BUN 5*  CREATININE 0.37*    Microbiology: Results for orders placed or performed during the hospital encounter of 03/28/17  Culture, blood (routine x 2)     Status: Abnormal   Collection Time: 03/28/17  8:35 PM  Result Value Ref Range Status   Specimen Description   Final    BLOOD RIGHT FOREARM Performed at [facility], [address] [place], [place] [address]    Special Requests   Final    BOTTLES DRAWN AEROBIC AND ANAEROBIC Blood Culture adequate volume Performed at [facility], [address], [place], [place] [address]    Culture  Setup Time   Final    GRAM POSITIVE  COCCI ANAEROBIC BOTTLE ONLY CRITICAL RESULT CALLED TO, READ BACK BY AND VERIFIED WITH: Demarlo Riojas BESANTI AT 3500 03/30/17.PMH    Culture (A)  Final    STAPHYLOCOCCUS SPECIES (COAGULASE NEGATIVE) THE SIGNIFICANCE OF ISOLATING THIS ORGANISM FROM A SINGLE SET OF BLOOD CULTURES WHEN MULTIPLE SETS ARE DRAWN IS UNCERTAIN. PLEASE NOTIFY THE MICROBIOLOGY DEPARTMENT WITHIN ONE WEEK IF SPECIATION AND SENSITIVITIES ARE REQUIRED. Performed at [facility], [place] [address] [place], [place] [address]    Report Status 04/01/2017 FINAL  Final  Blood Culture ID Panel (Reflexed)     Status: Abnormal   Collection Time: 03/28/17  8:35 PM  Result Value Ref Range Status    Enterococcus species NOT DETECTED NOT DETECTED Final   Listeria monocytogenes NOT DETECTED NOT DETECTED Final   Staphylococcus species DETECTED (A) NOT DETECTED Final    Comment: Methicillin (oxacillin) susceptible coagulase negative staphylococcus. Possible blood culture contaminant (unless isolated from more than one blood culture draw or clinical case suggests pathogenicity). No antibiotic treatment is indicated for blood  culture contaminants. CRITICAL RESULT CALLED TO, READ BACK BY AND VERIFIED WITH: Starsha Morning BESANTI AT 2993 03/30/17.PMH    Staphylococcus aureus NOT DETECTED NOT DETECTED Final   Methicillin resistance NOT DETECTED NOT DETECTED Final   Streptococcus species NOT DETECTED NOT DETECTED Final   Streptococcus agalactiae NOT DETECTED NOT DETECTED Final   Streptococcus pneumoniae NOT DETECTED NOT DETECTED Final   Streptococcus pyogenes NOT DETECTED NOT DETECTED Final   Acinetobacter baumannii NOT DETECTED NOT DETECTED Final   Enterobacteriaceae species NOT DETECTED NOT DETECTED Final   Enterobacter cloacae complex NOT DETECTED NOT DETECTED Final   Escherichia coli NOT DETECTED NOT DETECTED Final   Klebsiella oxytoca NOT DETECTED NOT DETECTED Final   Klebsiella pneumoniae NOT DETECTED NOT DETECTED Final   Proteus species NOT DETECTED NOT DETECTED Final   Serratia marcescens NOT DETECTED NOT DETECTED Final   Haemophilus influenzae NOT DETECTED NOT DETECTED Final   Neisseria meningitidis NOT DETECTED NOT DETECTED Final   Pseudomonas aeruginosa NOT DETECTED NOT DETECTED Final   Candida albicans NOT DETECTED NOT DETECTED Final   Candida glabrata NOT DETECTED NOT DETECTED Final   Candida krusei NOT DETECTED NOT DETECTED Final   Candida parapsilosis NOT DETECTED NOT DETECTED Final   Candida tropicalis NOT DETECTED NOT DETECTED Final    Comment: Performed at [facility], [address] [place], [place] [address]  Culture, blood (routine x 2)     Status: None   Collection  Time: 03/28/17  8:50 PM  Result Value Ref Range Status   Specimen Description BLOOD RIGHT ANTECUBITAL  Final   Special Requests   Final    BOTTLES DRAWN AEROBIC AND ANAEROBIC Blood Culture adequate volume   Culture   Final    NO GROWTH 5 DAYS Performed at [facility], [address] [place], [place] [address]    Report Status 04/02/2017 FINAL  Final  Body fluid culture     Status: None   Collection Time: 03/29/17 11:31 AM  Result Value Ref Range Status   Specimen Description   Final    PLEURAL Performed at [facility], [address] [place], [place] [address]    Special Requests   Final    NONE Performed at [facility], [address] [place], [place] [address]    Gram Stain   Final    ABUNDANT WBC PRESENT,BOTH PMN AND MONONUCLEAR NO ORGANISMS SEEN    Culture   Final  NO GROWTH 3 DAYS Performed at [facility], [place] [address], [place], [place] [address]    Report Status 04/02/2017 FINAL  Final  MRSA PCR Screening     Status: None   Collection Time: 04/02/17  1:47 PM  Result Value Ref Range Status   MRSA by PCR NEGATIVE NEGATIVE Final    Comment:        The GeneXpert MRSA Assay (FDA approved for NASAL specimens only), is one component of a comprehensive MRSA colonization surveillance program. It is not intended to diagnose MRSA infection nor to guide or monitor treatment for MRSA infections. Performed at [facility], [address] [place], [place] [address]   Aerobic/Anaerobic Culture (surgical/deep wound)     Status: None   Collection Time: 04/03/17  2:33 PM  Result Value Ref Range Status   Specimen Description   Final    TISSUE PLEURAL PEEL Performed at [facility], [address], [place], [place] [address]    Special Requests   Final    NONE Performed at [facility], [address] [place], [place] [address]    Gram Stain   Final    ABUNDANT WBC PRESENT, PREDOMINANTLY PMN NO  ORGANISMS SEEN    Culture   Final    No growth aerobically or anaerobically. Performed at [facility], [place] [address], [place], [place] [address]    Report Status 04/09/2017 FINAL  Final  Acid Fast Smear (AFB)     Status: None   Collection Time: 04/03/17  2:33 PM  Result Value Ref Range Status   AFB Specimen Processing Comment  Final    Comment: Tissue Grinding and Digestion/Decontamination   Acid Fast Smear Negative  Final    Comment: (NOTE) Performed At: [facility] [address], [place] [id number] Rush Farmer MD CZ:[id number]    Source (AFB) TISSUE  Final    Comment: PLEURAL PEEL Performed at [facility], [address] [place], [place] [address]   Aerobic/Anaerobic Culture (surgical/deep wound)     Status: None   Collection Time: 04/03/17  3:49 PM  Result Value Ref Range Status   Specimen Description   Final    LUNG RIGHT LOWER RIGHT LOWER LOBE OF LUNG Performed at [facility], [address], [place], [place] [address]    Special Requests   Final    NONE Performed at [facility], [address] [place], [place] [address]    Gram Stain NO WBC SEEN RARE GRAM POSITIVE COCCI IN PAIRS   Final   Culture   Final    No growth aerobically or anaerobically. Performed at [facility], [place] [address], [place], [place] [address]    Report Status 04/08/2017 FINAL  Final  Acid Fast Smear (AFB)     Status: None   Collection Time: 04/03/17  3:49 PM  Result Value Ref Range Status   AFB Specimen Processing Concentration  Final   Acid Fast Smear Negative  Final    Comment: (NOTE) Performed At: [facility] [place], [place] [id number] Rush Farmer MD VO:[id number]    Source (AFB) LUNG  Final    Comment: RIGHT LOWER LOBE OF LUNG Performed at [facility], [address] [place], [place] [address]     Studies/Results: Dg Chest Port 1 View  Result Date:  04/08/2017 CLINICAL DATA:  Respiratory failure.  Decortication EXAM: PORTABLE CHEST 1 VIEW COMPARISON:  Two days ago  FINDINGS: Multiple right-sided chest tubes tips at the base and apex. Small lateral right pneumothorax, stable. Pleural thickening/loculated fluid that is stable. Incomplete expansion of the right lung. Left chest is clear with stable left apical pleural based thickening. Normal heart size and mediastinal contours. IMPRESSION: 1. Trace pneumothorax on the right. 2. No evidence of increasing pleural fluid. Electronically Signed   By: [place] Fantasia M.D.   On: 04/08/2017 07:02    Assessment/Plan: ARIYON MITTLEMAN is a 57 y.o. male with hx of heavy etoh and tobacco use admitted 3 weeks after being discharged on augmentin for PNA and found to have extensive empyema. Now s/p chest tube and then definitive decortication on 1/24. All cultures negative. This is most likely aspiration PNA and usual pathogens include the oral streps and anaerobes.  Will need prolonged abx course however since no resistant organisms can treat with oral antibiotics.  MRSA PCR negative.  Hiv neg, AFB negative to date.   Recommendations Continue planned 6 week course of augmentin and follow with me and with Dr Genevive Bi who will manage his chest tubes. Will usually treat until all chest tubes are removed and for 2 weeks after. Discussed plan with patient. Currently CT are to water seal  Thank you very much for the consult. Will follow with you.  Leonel Ramsay   04/09/2017, 4:14 PM

## 2017-04-09 NOTE — Progress Notes (Signed)
[name] at [place] NAME: Kristopher [name]    MR#:  [id number]  DATE OF BIRTH:  22-May-1960  SUBJECTIVE:   Feels well. No CP. No Cough Afebrile  Lost weight 25 lbs in 1 months REVIEW OF SYSTEMS:    Review of Systems  Constitutional: Negative for fever, chills weight loss Positive for generalized weakness and fatigue HENT: Negative for ear pain, nosebleeds, congestion, facial swelling, rhinorrhea, neck pain, neck stiffness and ear discharge.   Respiratory: no shortness of breath or cough Cardiovascular: Negative for chest pain, palpitations and leg swelling.  Gastrointestinal: Negative for heartburn, abdominal pain, vomiting, diarrhea or consitpation Genitourinary: Negative for dysuria, urgency, frequency, hematuria Musculoskeletal: Negative for back pain or joint pain Neurological: Negative for dizziness, seizures, syncope, focal weakness,  numbness and headaches.  Hematological: Does not bruise/bleed easily.  Psychiatric/Behavioral: Negative for hallucinations, confusion, dysphoric mood  Tolerating Diet: yes   DRUG ALLERGIES:   Allergies  Allergen Reactions  . Percocet [Oxycodone-Acetaminophen] Itching    VITALS:  Blood pressure 128/87, pulse 81, temperature 98 F (36.7 C), temperature source Oral, resp. rate 17, height 5\' 11"  (1.803 m), weight 58.6 kg (129 lb 3 oz), SpO2 98 %.  PHYSICAL EXAMINATION:  Constitutional: Appears thin. No distress. HENT: Normocephalic. Marland Kitchen Oropharynx is clear and moist.  Eyes: Conjunctivae and EOM are normal. PERRLA, no scleral icterus.  Neck: Normal ROM. Neck supple. No JVD. No tracheal deviation. CVS: Irregular rate and rhythm, tachycardia, no murmurs, no gallops, no carotid bruit.  Pulmonary: Chest tube placed with decreased breath sounds right upper lobe . Abdominal: Soft. BS +,  no distension, tenderness, rebound or guarding.  Musculoskeletal: Normal range of motion. No edema and no tenderness.   Neuro: Alert. CN 2-12 grossly intact. No focal deficits. Skin: Skin is warm and dry. No rash noted. Psychiatric: Normal mood and affect.    LABORATORY PANEL:   CBC Recent Labs  Lab 04/07/17 0513  WBC 8.2  HGB 8.4*  HCT 25.1*  PLT 676*   ------------------------------------------------------------------------------------------------------------------  Chemistries  Recent Labs  Lab 04/07/17 0513  NA 134*  K 3.8  CL 94*  CO2 33*  GLUCOSE 95  BUN 5*  CREATININE 0.37*  CALCIUM 8.5*  MG 1.9   ------------------------------------------------------------------------------------------------------------------  Cardiac Enzymes No results for input(s): TROPONINI in the last 168 hours. ------------------------------------------------------------------------------------------------------------------  RADIOLOGY:  Dg Chest Port 1 View  Result Date: 04/08/2017 CLINICAL DATA:  Respiratory failure.  Decortication EXAM: PORTABLE CHEST 1 VIEW COMPARISON:  Two days ago FINDINGS: Multiple right-sided chest tubes tips at the base and apex. Small lateral right pneumothorax, stable. Pleural thickening/loculated fluid that is stable. Incomplete expansion of the right lung. Left chest is clear with stable left apical pleural based thickening. Normal heart size and mediastinal contours. IMPRESSION: 1. Trace pneumothorax on the right. 2. No evidence of increasing pleural fluid. Electronically Signed   By: [place] Fantasia M.D.   On: 04/08/2017 07:02   ASSESSMENT AND PLAN:   57 year old male with EtOH abuse who presents with shortness of breath and found to have large empyema and CT scan  1. Right Empyema/ right lateral apex pneumothorax with re expansion of right upper lobe: Patient is status post chest tube placement. Decortication for empyema - 04/03/17.   Toradol for pain Zosyn, changed to augmentin. Continue chest tube  2. Hyponatremia: This is due to empyema and dehydration,  improving. Follow sodium levels  3. EtOH abuse: Uneventful detox so far  Was on CIWA protocol  4. Severe protein calorie malnutrition: Dietary consultation requested and appreciated. Continue dietary supplement  5. One out of 2 gram-positive cocci staph species which is contaminant  6. Thrombocytosis    Reactive  7. New onset Afib due to empyema has resolved after breif amiodarone drip No anti coagulation Appreciate cardiology input  Management plans discussed with the patient and he is in agreement.  CODE STATUS: FULL  TOTAL TIME TAKING CARE OF THIS PATIENT: 28 minutes.   PT recommends home with home health upon discharge.  POSSIBLE D/C 2 days, DEPENDING ON CLINICAL CONDITION.   Kristopher [name] M.D on 04/09/2017 at 12:40 PM  Between 7am to 6pm - Pager - [phone]  After 6pm go to [email or web address] - password [email or web address] [facility]  Office  [phone]  CC: Primary care physician; Patient, No Pcp Per  Note: This dictation was prepared with Dragon dictation along with smaller phrase technology. Any transcriptional errors that result from this process are unintentional.

## 2017-04-09 NOTE — Care Management (Signed)
Per Nursing post procedure patient has been ambulating independently and will not require a PT consult.

## 2017-04-10 ENCOUNTER — Inpatient Hospital Stay: Payer: 59

## 2017-04-10 NOTE — Progress Notes (Signed)
[place] at [place] NAME: Kristopher [name]    MR#:  [id number]  DATE OF BIRTH:  Apr 13, 1960  SUBJECTIVE:    No CP. Afebrile  REVIEW OF SYSTEMS:    Review of Systems  Constitutional: Negative for fever, chills weight loss Positive for generalized weakness and fatigue HENT: Negative for ear pain, nosebleeds, congestion, facial swelling, rhinorrhea, neck pain, neck stiffness and ear discharge.   Respiratory: no shortness of breath or cough Cardiovascular: Negative for chest pain, palpitations and leg swelling.  Gastrointestinal: Negative for heartburn, abdominal pain, vomiting, diarrhea or consitpation Genitourinary: Negative for dysuria, urgency, frequency, hematuria Musculoskeletal: Negative for back pain or joint pain Neurological: Negative for dizziness, seizures, syncope, focal weakness,  numbness and headaches.  Hematological: Does not bruise/bleed easily.  Psychiatric/Behavioral: Negative for hallucinations, confusion, dysphoric mood  Tolerating Diet: yes   DRUG ALLERGIES:   Allergies  Allergen Reactions  . Percocet [Oxycodone-Acetaminophen] Itching    VITALS:  Blood pressure 113/69, pulse 79, temperature 98.1 F (36.7 C), temperature source Oral, resp. rate 16, height 5\' 11"  (1.803 m), weight 58.6 kg (129 lb 3 oz), SpO2 98 %.  PHYSICAL EXAMINATION:  Constitutional: Appears thin. No distress. HENT: Normocephalic. Marland Kitchen Oropharynx is clear and moist.  Eyes: Conjunctivae and EOM are normal. PERRLA, no scleral icterus.  Neck: Normal ROM. Neck supple. No JVD. No tracheal deviation. CVS: Irregular rate and rhythm, tachycardia, no murmurs, no gallops, no carotid bruit.  Pulmonary: Chest tube placed with decreased breath sounds right upper lobe . Abdominal: Soft. BS +,  no distension, tenderness, rebound or guarding.  Musculoskeletal: Normal range of motion. No edema and no tenderness.  Neuro: Alert. CN 2-12 grossly intact. No focal  deficits. Skin: Skin is warm and dry. No rash noted. Psychiatric: Normal mood and affect.    LABORATORY PANEL:   CBC Recent Labs  Lab 04/07/17 0513  WBC 8.2  HGB 8.4*  HCT 25.1*  PLT 676*   ------------------------------------------------------------------------------------------------------------------  Chemistries  Recent Labs  Lab 04/07/17 0513  NA 134*  K 3.8  CL 94*  CO2 33*  GLUCOSE 95  BUN 5*  CREATININE 0.37*  CALCIUM 8.5*  MG 1.9   ------------------------------------------------------------------------------------------------------------------  Cardiac Enzymes No results for input(s): TROPONINI in the last 168 hours. ------------------------------------------------------------------------------------------------------------------  RADIOLOGY:  No results found. ASSESSMENT AND PLAN:   57 year old male with EtOH abuse who presents with shortness of breath and found to have large empyema and CT scan  1. Right Empyema/ right lateral apex pneumothorax with re expansion of right upper lobe: Patient is status post chest tube placement. Decortication for empyema - 04/03/17.   Toradol for pain Zosyn, changed to augmentin. Continue chest tube  2. Hyponatremia: This is due to empyema and dehydration, improving. Follow sodium levels  3. EtOH abuse: Uneventful detox so far  Was on CIWA protocol  4. Severe protein calorie malnutrition: Dietary consultation requested and appreciated. Continue dietary supplement  5.1/2 gram-positive cocci staph species which is contaminant  6. Thrombocytosis    Reactive  7. New onset Afib due to empyema has resolved after breif amiodarone drip No anti coagulation Appreciate cardiology input  Management plans discussed with the patient and he is in agreement.  CODE STATUS: FULL  TOTAL TIME TAKING CARE OF THIS PATIENT: 25 minutes.   Likely d/c home tomorrow  Neita Carp M.D on 04/10/2017 at 11:47 AM  Between 7am  to 6pm - Pager - [phone]  After 6pm go to [email or web address] -  password [email or web address] [facility]  Office  [phone]  CC: Primary care physician; Patient, No Pcp Per  Note: This dictation was prepared with Dragon dictation along with smaller phrase technology. Any transcriptional errors that result from this process are unintentional.

## 2017-04-10 NOTE — Progress Notes (Signed)
Doing well.  Ambulating in halls.  Pain under good control.  Wounds clean and dry.  Drainage is minimal.  No obvious air leak  Chest tube to water seal Repeat CXRay today  [employer]

## 2017-04-10 NOTE — Plan of Care (Signed)
Pt chest tube to water suction. Continues to be doing well

## 2017-04-11 ENCOUNTER — Inpatient Hospital Stay: Payer: 59

## 2017-04-11 ENCOUNTER — Other Ambulatory Visit: Payer: Self-pay

## 2017-04-11 ENCOUNTER — Telehealth: Payer: Self-pay | Admitting: *Deleted

## 2017-04-11 DIAGNOSIS — J851 Abscess of lung with pneumonia: Secondary | ICD-10-CM

## 2017-04-11 MED ORDER — ASPIRIN EC 81 MG PO TBEC: 81.0000 mg | DELAYED_RELEASE_TABLET | Freq: Every day | ORAL | Status: DC

## 2017-04-11 MED ORDER — OXYCODONE-ACETAMINOPHEN 7.5-325 MG PO TABS: 1.0000 | ORAL_TABLET | Freq: Three times a day (TID) | ORAL | 0 refills | Status: DC | PRN

## 2017-04-11 MED ORDER — AMIODARONE HCL 200 MG PO TABS: 200.0000 mg | ORAL_TABLET | Freq: Two times a day (BID) | ORAL | 0 refills | Status: DC

## 2017-04-11 MED ORDER — AMIODARONE HCL 200 MG PO TABS: 200.0000 mg | ORAL_TABLET | Freq: Every day | ORAL | 0 refills | Status: DC

## 2017-04-11 MED ORDER — AMOXICILLIN-POT CLAVULANATE 875-125 MG PO TABS: 1.0000 | ORAL_TABLET | Freq: Two times a day (BID) | ORAL | 0 refills | Status: DC

## 2017-04-11 NOTE — Progress Notes (Signed)
04/11/2017  1:47 PM  [name] to be D/C'd Home per MD order.  Discussed prescriptions and follow up appointments with the patient. Prescriptions given to patient, medication list explained in detail. Pt verbalized understanding.  Allergies as of 04/11/2017   No Active Allergies     Medication List    STOP taking these medications   benzonatate 200 MG capsule Commonly known as:  TESSALON     TAKE these medications   amiodarone 200 MG tablet Commonly known as:  PACERONE Take 1 tablet (200 mg total) by mouth daily.   amoxicillin-clavulanate 875-125 MG tablet Commonly known as:  AUGMENTIN Take 1 tablet by mouth 2 (two) times daily. X 14 days   aspirin EC 81 MG tablet Take 1 tablet (81 mg total) by mouth daily.   oxyCODONE-acetaminophen 7.5-325 MG tablet Commonly known as:  PERCOCET Take 1-2 tablets by mouth every 8 (eight) hours as needed for severe pain. Notes to patient:  Last dose given today at 12:15pm   PROAIR HFA 108 (90 Base) MCG/ACT inhaler Generic drug:  albuterol Inhale 2 puffs into the lungs every 6 (six) hours as needed.       Vitals:   04/11/17 0409 04/11/17 1228  BP: 117/77 120/77  Pulse: 76 80  Resp: 19 20  Temp: 98.2 F (36.8 C) 98 F (36.7 C)  SpO2: 96% 100%    Skin clean, dry and intact without evidence of skin break down, no evidence of skin tears noted. IV catheter discontinued intact. Site without signs and symptoms of complications. Dressing and pressure applied. Pt denies pain at this time. No complaints noted.  An After Visit Summary was printed and given to the patient. Patient escorted via [place], and D/C home via private auto.  Kristopher [name]

## 2017-04-11 NOTE — Telephone Encounter (Signed)
Patient currently admitted

## 2017-04-11 NOTE — Telephone Encounter (Signed)
-----   Message from Blain Pais sent at 04/11/2017 11:03 AM EST ----- Regarding: tcm/ph 2/11 2:00 Christell Faith, PA

## 2017-04-11 NOTE — Care Management (Signed)
Patient was discharged home today.  Chest tube was converted to empyema tubes and drainage back.  Bedside nurse provided education prior to discharge.  Patient is scheduled for follow up with dr Genevive Bi in 1 week.   Bedside RN confirmed with MD that patient is not to have home health services arranged at discharge.

## 2017-04-11 NOTE — Progress Notes (Signed)
Feels good and wants to go home.  No air leak today Minimal drainage from tubes  Converted to open atmospheric tubes and will repeat CXRay now.  If OK, convert to empyema tubes.  Will need to see in the office in one week.  [employer].

## 2017-04-14 ENCOUNTER — Telehealth: Payer: Self-pay

## 2017-04-14 ENCOUNTER — Telehealth: Payer: Self-pay | Admitting: Cardiothoracic Surgery

## 2017-04-14 MED ORDER — OXYCODONE-ACETAMINOPHEN 7.5-325 MG PO TABS: 1.0000 | ORAL_TABLET | Freq: Three times a day (TID) | ORAL | 0 refills | Status: DC | PRN

## 2017-04-14 NOTE — Telephone Encounter (Signed)
Patient came by and picked up his rx.

## 2017-04-14 NOTE — Telephone Encounter (Signed)
Patient notified of prescription refill and placed at front desk for pick up.

## 2017-04-14 NOTE — Telephone Encounter (Signed)
Patient is calling asking for a refill on pain medication or something else for the pain. Pain level is at a 2 right now. Patient is coming in on Friday to see Dr. Genevive Bi. Please call patient and advise.

## 2017-04-14 NOTE — Telephone Encounter (Signed)
Notified patient  regarding chest xray prior to visit 04/18/17 with Dr.Oaks. Patient verbalized understanding.

## 2017-04-14 NOTE — Telephone Encounter (Signed)
No answer. Voicemail box not set up yet.

## 2017-04-15 NOTE — Discharge Summary (Signed)
[facility] NAME: Kristopher [name]    MR#:  [id number]  DATE OF BIRTH:  03/20/1960  DATE OF ADMISSION:  03/28/2017 ADMITTING PHYSICIAN: Kristopher Flock, MD  DATE OF DISCHARGE: 04/11/2017  1:48 PM  PRIMARY CARE PHYSICIAN: [name], No Pcp Per   ADMISSION DIAGNOSIS:  Empyema ([place]) [J86.9]  DISCHARGE DIAGNOSIS:  Active Problems:   Empyema ([place])   Lung abscess ([place])   SECONDARY DIAGNOSIS:   Past Medical History:  Diagnosis Date  . Electric injury   . PNA (pneumonia)      ADMITTING HISTORY  HISTORY OF PRESENT ILLNESS: Kristopher [name]  is a 57 y.o. male with a known history of pneumonia requiring admission in December.  [name] has been admitted twice for same.  At that time he was treated for H CAP pneumonia.  He was discharged home on Augmentin continue to not feel well he started getting short of breath with exertion.  [name] has not had any cough fevers or chills however he just not does not feel well.  He states that he was smoking before he has stopped smoking he used to drink 12 beers a day but now he is cut down to 6 beers a day.  He denies any nausea vomiting or diarrhea.    HOSPITAL COURSE:   57 year old male with EtOH abuse who presents with shortness of breath and found to have large empyema and CT scan  1. Right Empyema/ right lateral apex pneumothorax with re expansion of right upper lobe: [name] is status post chest tube placement. Decortication for empyema - 04/03/17.  On the day of discharge chest x-ray was repeated and looks significantly better. Zosyn, changed to augmentin.  Prescription given for 4 weeks.  Follow-up with Dr. Genevive Bi in 1 week and Dr. Ola Spurr in 2 weeks for further care. Continue chest tube with Pleur-evac Afebrile and normal WBC  2. Hyponatremia: This is due to empyema and dehydration, resolved.  3. EtOH abuse: Uneventful detox Was on CIWA protocol  4. Severe protein calorie malnutrition:  Dietary consultation requested and appreciated. Continue dietary supplement  5.1/2 gram-positive cocci staph species which is contaminant  6. Thrombocytosis    Reactive  7. New onset Afib due to empyema has resolved after breif amiodarone drip No anti coagulation Appreciate cardiology input Sent home on amiodarone tablets.  Follow-up with cardiology in 1 week.  [name] stable for discharge home.  CONSULTS OBTAINED:  Treatment Team:  Florene Glen, MD Leonel Ramsay, MD  DRUG ALLERGIES:  No Active Allergies  DISCHARGE MEDICATIONS:   Allergies as of 04/11/2017   No Active Allergies     Medication List    STOP taking these medications   benzonatate 200 MG capsule Commonly known as:  TESSALON     TAKE these medications   amiodarone 200 MG tablet Commonly known as:  PACERONE Take 1 tablet (200 mg total) by mouth daily.   amoxicillin-clavulanate 875-125 MG tablet Commonly known as:  AUGMENTIN Take 1 tablet by mouth 2 (two) times daily. X 14 days   aspirin EC 81 MG tablet Take 1 tablet (81 mg total) by mouth daily.   PROAIR HFA 108 (90 Base) MCG/ACT inhaler Generic drug:  albuterol Inhale 2 puffs into the lungs every 6 (six) hours as needed.       Today   VITAL SIGNS:  Blood pressure 120/77, pulse 80, temperature 98 F (36.7 C), temperature source Oral, resp. rate 20, height 5\' 11"  (1.803  m), weight 58.6 kg (129 lb 3 oz), SpO2 100 %.  I/O:  No intake or output data in the 24 hours ending 04/15/17 1715  PHYSICAL EXAMINATION:  Physical Exam  GENERAL:  Kristopher [name] lying in the bed with no acute distress.  LUNGS: Normal breath sounds bilaterally, no wheezing, rales,rhonchi or crepitation. No use of accessory muscles of respiration.  Chest tubes in place CARDIOVASCULAR: S1, S2 normal. No murmurs, rubs, or gallops.  ABDOMEN: Soft, non-tender, non-distended. Bowel sounds present. No organomegaly or mass.  NEUROLOGIC: Moves all 4  extremities. PSYCHIATRIC: [name] is alert and oriented x 3.  SKIN: No obvious rash, lesion, or ulcer.   DATA REVIEW:   CBC No results for input(s): WBC, HGB, HCT, PLT in the last 168 hours.  Chemistries  No results for input(s): NA, K, CL, CO2, GLUCOSE, BUN, CREATININE, CALCIUM, MG, AST, ALT, ALKPHOS, BILITOT in the last 168 hours.  Invalid input(s): GFRCGP  Cardiac Enzymes No results for input(s): TROPONINI in the last 168 hours.  Microbiology Results  Results for orders placed or performed during the hospital encounter of 03/28/17  Culture, blood (routine x 2)     Status: Abnormal   Collection Time: 03/28/17  8:35 PM  Result Value Ref Range Status   Specimen Description   Final    BLOOD RIGHT FOREARM Performed at [facility], [address] [place], [place] [address]    Special Requests   Final    BOTTLES DRAWN AEROBIC AND ANAEROBIC Blood Culture adequate volume Performed at [facility], [address], [place], [place] [address]    Culture  Setup Time   Final    GRAM POSITIVE COCCI ANAEROBIC BOTTLE ONLY CRITICAL RESULT CALLED TO, READ BACK BY AND VERIFIED WITH: DAVID BESANTI AT 8416 03/30/17.PMH    Culture (A)  Final    STAPHYLOCOCCUS SPECIES (COAGULASE NEGATIVE) THE SIGNIFICANCE OF ISOLATING THIS ORGANISM FROM A SINGLE SET OF BLOOD CULTURES WHEN MULTIPLE SETS ARE DRAWN IS UNCERTAIN. PLEASE NOTIFY THE MICROBIOLOGY DEPARTMENT WITHIN ONE WEEK IF SPECIATION AND SENSITIVITIES ARE REQUIRED. Performed at [facility], [place] [address] [place], [place] [address]    Report Status 04/01/2017 FINAL  Final  Blood Culture ID Panel (Reflexed)     Status: Abnormal   Collection Time: 03/28/17  8:35 PM  Result Value Ref Range Status   Enterococcus species NOT DETECTED NOT DETECTED Final   Listeria monocytogenes NOT DETECTED NOT DETECTED Final   Staphylococcus species DETECTED (A) NOT DETECTED Final    Comment: Methicillin (oxacillin) susceptible  coagulase negative staphylococcus. Possible blood culture contaminant (unless isolated from more than one blood culture draw or clinical case suggests pathogenicity). No antibiotic treatment is indicated for blood  culture contaminants. CRITICAL RESULT CALLED TO, READ BACK BY AND VERIFIED WITH: DAVID BESANTI AT 1601 03/30/17.PMH    Staphylococcus aureus NOT DETECTED NOT DETECTED Final   Methicillin resistance NOT DETECTED NOT DETECTED Final   Streptococcus species NOT DETECTED NOT DETECTED Final   Streptococcus agalactiae NOT DETECTED NOT DETECTED Final   Streptococcus pneumoniae NOT DETECTED NOT DETECTED Final   Streptococcus pyogenes NOT DETECTED NOT DETECTED Final   Acinetobacter baumannii NOT DETECTED NOT DETECTED Final   Enterobacteriaceae species NOT DETECTED NOT DETECTED Final   Enterobacter cloacae complex NOT DETECTED NOT DETECTED Final   Escherichia coli NOT DETECTED NOT DETECTED Final   Klebsiella oxytoca NOT DETECTED NOT DETECTED Final   Klebsiella pneumoniae NOT DETECTED NOT DETECTED Final   Proteus species  NOT DETECTED NOT DETECTED Final   Serratia marcescens NOT DETECTED NOT DETECTED Final   Haemophilus influenzae NOT DETECTED NOT DETECTED Final   Neisseria meningitidis NOT DETECTED NOT DETECTED Final   Pseudomonas aeruginosa NOT DETECTED NOT DETECTED Final   Candida albicans NOT DETECTED NOT DETECTED Final   Candida glabrata NOT DETECTED NOT DETECTED Final   Candida krusei NOT DETECTED NOT DETECTED Final   Candida parapsilosis NOT DETECTED NOT DETECTED Final   Candida tropicalis NOT DETECTED NOT DETECTED Final    Comment: Performed at [facility], [address] [place], [place] [address]  Culture, blood (routine x 2)     Status: None   Collection Time: 03/28/17  8:50 PM  Result Value Ref Range Status   Specimen Description BLOOD RIGHT ANTECUBITAL  Final   Special Requests   Final    BOTTLES DRAWN AEROBIC AND ANAEROBIC Blood Culture adequate volume    Culture   Final    NO GROWTH 5 DAYS Performed at [facility], [address], [place], [place] [address]    Report Status 04/02/2017 FINAL  Final  Body fluid culture     Status: None   Collection Time: 03/29/17 11:31 AM  Result Value Ref Range Status   Specimen Description   Final    PLEURAL Performed at [facility], [address], [place], [place] [address]    Special Requests   Final    NONE Performed at [facility], [address] [place], [place] [address]    Gram Stain   Final    ABUNDANT WBC PRESENT,BOTH PMN AND MONONUCLEAR NO ORGANISMS SEEN    Culture   Final    NO GROWTH 3 DAYS Performed at [facility], [place] [address], [place], [place] [address]    Report Status 04/02/2017 FINAL  Final  MRSA PCR Screening     Status: None   Collection Time: 04/02/17  1:47 PM  Result Value Ref Range Status   MRSA by PCR NEGATIVE NEGATIVE Final    Comment:        The GeneXpert MRSA Assay (FDA approved for NASAL specimens only), is one component of a comprehensive MRSA colonization surveillance program. It is not intended to diagnose MRSA infection nor to guide or monitor treatment for MRSA infections. Performed at [facility], [address] [place], [place] [address]   Aerobic/Anaerobic Culture (surgical/deep wound)     Status: None   Collection Time: 04/03/17  2:33 PM  Result Value Ref Range Status   Specimen Description   Final    TISSUE PLEURAL PEEL Performed at [facility], [address] [place], [place] [address]    Special Requests   Final    NONE Performed at [facility], [address] [place], [place] [address]    Gram Stain   Final    ABUNDANT WBC PRESENT, PREDOMINANTLY PMN NO ORGANISMS SEEN    Culture   Final    No growth aerobically or anaerobically. Performed at [facility], [place] [address] [place],  [address]    Report Status 04/09/2017 FINAL  Final  Acid Fast  Smear (AFB)     Status: None   Collection Time: 04/03/17  2:33 PM  Result Value Ref Range Status   AFB Specimen Processing Comment  Final    Comment: Tissue Grinding and Digestion/Decontamination   Acid Fast Smear Negative  Final    Comment: (NOTE) Performed At: [place]  [place], [place] [id number] Rush Farmer MD [id number]    Source (AFB) TISSUE  Final    Comment: PLEURAL PEEL Performed at [facility], [address] [place], [place] [address]   Aerobic/Anaerobic Culture (surgical/deep wound)     Status: None   Collection Time: 04/03/17  3:49 PM  Result Value Ref Range Status   Specimen Description   Final    LUNG RIGHT LOWER RIGHT LOWER LOBE OF LUNG Performed at [facility], [address], [place], [place] [address]    Special Requests   Final    NONE Performed at [facility], [place], [place] [address]    Gram Stain NO WBC SEEN RARE GRAM POSITIVE COCCI IN PAIRS   Final   Culture   Final    No growth aerobically or anaerobically. Performed at [facility], [place] [address], [place], [place] [address]    Report Status 04/08/2017 FINAL  Final  Acid Fast Smear (AFB)     Status: None   Collection Time: 04/03/17  3:49 PM  Result Value Ref Range Status   AFB Specimen Processing Concentration  Final   Acid Fast Smear Negative  Final    Comment: (NOTE) Performed At: [facility] [place], [place] [id number] Rush Farmer MD VV:[id number]    Source (AFB) LUNG  Final    Comment: RIGHT LOWER LOBE OF LUNG Performed at [facility], [address] [place] [address], [place],  [address]     RADIOLOGY:  No results found.  Follow up with PCP in 1 week.  Management plans discussed with [name], family and they are in agreement.  CODE STATUS:  Code Status History    Date Active Date Inactive Code Status Order ID Comments User Context   03/28/2017 21:13 04/11/2017  16:53 Full Code [id number]  Kristopher Flock, MD ED   03/01/2017 17:58 03/04/2017 21:32 Full Code [id number]  Nicholes Mango, MD Inpatient      TOTAL TIME TAKING CARE OF THIS [name] ON DAY OF DISCHARGE: more than 30 minutes.   Leia Alf Rishawn Walck M.D on 04/15/2017 at 5:15 PM  Between 7am to 6pm - Pager - [phone]  After 6pm go to [email or web address] - password [email or web address] [facility]  Office  [phone]  CC: Primary care physician; [name], No Pcp Per  Note: This dictation was prepared with Dragon dictation along with smaller phrase technology. Any transcriptional errors that result from this process are unintentional.

## 2017-04-15 NOTE — Telephone Encounter (Signed)
Patient contacted regarding discharge from blank on blank.   Patient understands to follow up with provider ? On 04/21/17 at 2:00pm at [facility].  Patient understands discharge instructions? Yes   Patient understands medications and regiment? Yes but stating the following; "I told the doctor in the hospital you can put me on whatever you want to but I'm going to go home and throw it in the trash."   Patient understands to bring all medications to this visit? Patient indifferent.  Patient does not feel he needs this appointment. I explained it was in his best interest to keep appointment and then it can be decided if further cardiology f/u is needed. He verbalized understanding.

## 2017-04-17 ENCOUNTER — Encounter: Payer: Self-pay | Admitting: Physician Assistant

## 2017-04-17 NOTE — Progress Notes (Deleted)
Cardiology Office Note Date:  04/17/2017  Patient ID:  Kristopher [name], Kristopher [name] 08-14-1960, MRN [id number] PCP:  Patient, No Pcp Per  Cardiologist:  Dr. Saunders Revel, MD  ***refresh   Chief Complaint: Hospital follow up  History of Present Illness: Kristopher [name] is a 57 y.o. male with history of recently diagnosed Afib in the setting of PNA complicated by empyema, anemia of uncertain etiology, alcohol abuse, and electrical injury who presents for hospital follow up after recent admission to [facility] from 3/7-1/6 for PNA complicated by empyema.   Patient had reported a long history of intermittent palpitations for several years that he had always attributed to Afib, as his mother also had Afib. He had never been evaluated by a cardiologist prior to the above admission or sought treatment for his palpitations. He was previously admitted to the hospital in 02/2017 with PNA after failing outpatient treatment with Avelox. He was treated with IV antibiotics and discharged with Augmentin with outpatient follow up with ID. Due to continued SOB, he was readmitted on 03/28/2017 with workup revealing a right-sided empyema which was treated with chest tube placement and subsequent bronchoscopy and right thoractomy with decortication and pleural drainage of lung abscess. His post-operative course was complicated by Afib with RVR which was treated with amiodarone with subsequent conversion to sinus rhythm. Echo showed an EF of 55-60%, unable to exlcude RWMA, mitral valve with systolic bowing without prolapse, mildly dilated RV cavity size with normal RV systolic function, mildly dilated right atrium. Discharge labs showed a normal TSH, free T4 1.30, K+ 3.8, Mg++ 1.9, WBC 8.2, HGB 8.4 (baseline 13-14), PLT 676, A1c 6.1. Given his CHADS2VASC of 0, and with him spontaneously converting to sinus rhythm prior to cardiology seeing him, he was not placed on long term, full-dose anticoagulation. He was continued on PO amidoarone in the acute  setting.   ***   Past Medical History:  Diagnosis Date  . Alcohol abuse   . Anemia   . Electric injury   . PAF (paroxysmal atrial fibrillation) (HCC)    a. in the setting of PNA with empyema; b. CHADS2VASc => 0; c. TTE 1/19: EF of 55-60%, unable to exlcude RWMA, mitral valve with systolic bowing without prolapse, mildly dilated RV cavity size with normal RV systolic function, mildly dilated right atrium  . PNA (pneumonia)    a. 9/67: complicated by right-sided empyema which was treated with chest tube placement and subsequent bronchoscopy and right thoractomy with decortication and pleural drainage of lung abscess  . Thrombocytosis ([place])     Past Surgical History:  Procedure Laterality Date  . DECORTICATION N/A 04/03/2017   Procedure: DECORTICATION;  Surgeon: Nestor Lewandowsky, MD;  Location: [facility] ORS;  Service: Thoracic;  Laterality: N/A;  debridement of lung abscess  . SKIN GRAFT    . THORACOTOMY Right 04/03/2017   Procedure: THORACOTOMY MAJOR;  Surgeon: Nestor Lewandowsky, MD;  Location: [facility] ORS;  Service: Thoracic;  Laterality: Right;  Marland Kitchen VIDEO BRONCHOSCOPY N/A 04/03/2017   Procedure: PREOP BRONCHOSCOPY;  Surgeon: Nestor Lewandowsky, MD;  Location: [facility] ORS;  Service: Thoracic;  Laterality: N/A;    No outpatient medications have been marked as taking for the 04/21/17 encounter (Appointment) with Rise Mu, PA-C.    Allergies:   Patient has no active allergies.   Social History:  The patient  reports that he has quit smoking. he has never used smokeless tobacco. He reports that he drinks alcohol. He reports that he does not use drugs.  Family History:  The patient's family history includes Atrial fibrillation in his mother; Dementia in his mother.  ROS:   ROS   PHYSICAL EXAM: *** VS:  There were no vitals taken for this visit. BMI: There is no height or weight on file to calculate BMI.  Physical Exam    EKG:  Was ordered and interpreted by me today. Shows ***  Recent  Labs: 03/28/2017: ALT 25 04/07/2017: BUN 5; Creatinine, Ser 0.37; Hemoglobin 8.4; Magnesium 1.9; Platelets 676; Potassium 3.8; Sodium 134 04/08/2017: TSH 4.072  No results found for requested labs within last 8760 hours.   Estimated Creatinine Clearance: 83.4 mL/min (A) (by C-G formula based on SCr of 0.37 mg/dL (L)).   Wt Readings from Last 3 Encounters:  04/08/17 129 lb 3 oz (58.6 kg)  03/02/17 121 lb 6.4 oz (55.1 kg)     Other studies reviewed: Additional studies/records reviewed today include: summarized above  ASSESSMENT AND PLAN:  1. ***  Disposition: F/u with *** in   Current medicines are reviewed at length with the patient today.  The patient did not have any concerns regarding medicines.  Signed, Christell Faith, PA-C 04/17/2017 11:00 AM     [place], [place] [address] [phone]

## 2017-04-18 ENCOUNTER — Ambulatory Visit (INDEPENDENT_AMBULATORY_CARE_PROVIDER_SITE_OTHER): Payer: 59 | Admitting: Cardiothoracic Surgery

## 2017-04-18 ENCOUNTER — Ambulatory Visit
Admission: RE | Admit: 2017-04-18 | Discharge: 2017-04-18 | Disposition: A | Discharge status: Home / Self Care | Payer: 59 | Source: Ambulatory Visit | Attending: Cardiothoracic Surgery | Admitting: Cardiothoracic Surgery

## 2017-04-18 ENCOUNTER — Encounter: Payer: Self-pay | Admitting: Cardiothoracic Surgery

## 2017-04-18 VITALS — BP 93/63 | HR 113 | Temp 98.4°F | Ht 71.0 in | Wt 121.8 lb

## 2017-04-18 DIAGNOSIS — J90 Pleural effusion, not elsewhere classified: Secondary | ICD-10-CM | POA: Insufficient documentation

## 2017-04-18 DIAGNOSIS — J851 Abscess of lung with pneumonia: Secondary | ICD-10-CM

## 2017-04-18 DIAGNOSIS — J869 Pyothorax without fistula: Secondary | ICD-10-CM

## 2017-04-18 DIAGNOSIS — J9811 Atelectasis: Secondary | ICD-10-CM | POA: Diagnosis not present

## 2017-04-18 MED ORDER — OXYCODONE-ACETAMINOPHEN 7.5-325 MG PO TABS: 1.0000 | ORAL_TABLET | Freq: Three times a day (TID) | ORAL | 0 refills | Status: DC | PRN

## 2017-04-18 NOTE — Patient Instructions (Signed)
Please have your Chest xray prior  to seeing Dr.Oaks. Please see your follow up appointment listed below.

## 2017-04-18 NOTE — Progress Notes (Signed)
He returns today in follow-up.  He states he has been getting along pretty well.  His breathing is much improved from when he first came in.  He has had no fevers or chills.  He has noticed an air leak from his tubes and some whistling with Valsalva maneuvers along the Heimlich valve.  His lungs are clear on the left and slightly diminished on the right.  His thoracotomy wound is well-healed.  We removed the staples and placed Steri-Strips.  His chest tube sites are clean and dry.  We did not advance his tubes today.  His chest x-ray shows some loculated air pockets which are quite small and produce around the tube.  I think I would like to leave these intact for another week.  I did refill his prescription for his narcotics and I will see him back again in 1 week.  I also counseled him on taking Motrin 400 mg every 8 hours to assist with his pain control.  Nestor Lewandowsky

## 2017-04-21 ENCOUNTER — Ambulatory Visit: Payer: 59 | Admitting: Physician Assistant

## 2017-04-25 ENCOUNTER — Ambulatory Visit
Admission: RE | Admit: 2017-04-25 | Discharge: 2017-04-25 | Disposition: A | Discharge status: Home / Self Care | Payer: 59 | Source: Ambulatory Visit | Attending: Cardiothoracic Surgery | Admitting: Cardiothoracic Surgery

## 2017-04-25 ENCOUNTER — Telehealth: Payer: Self-pay | Admitting: Cardiothoracic Surgery

## 2017-04-25 ENCOUNTER — Ambulatory Visit (INDEPENDENT_AMBULATORY_CARE_PROVIDER_SITE_OTHER): Payer: 59 | Admitting: Cardiothoracic Surgery

## 2017-04-25 ENCOUNTER — Encounter: Payer: Self-pay | Admitting: Cardiothoracic Surgery

## 2017-04-25 VITALS — BP 110/79 | HR 108 | Temp 97.8°F | Resp 20 | Ht 71.0 in | Wt 122.6 lb

## 2017-04-25 DIAGNOSIS — J90 Pleural effusion, not elsewhere classified: Secondary | ICD-10-CM | POA: Diagnosis not present

## 2017-04-25 DIAGNOSIS — J869 Pyothorax without fistula: Secondary | ICD-10-CM

## 2017-04-25 DIAGNOSIS — Z9889 Other specified postprocedural states: Secondary | ICD-10-CM | POA: Diagnosis not present

## 2017-04-25 DIAGNOSIS — Z978 Presence of other specified devices: Secondary | ICD-10-CM | POA: Insufficient documentation

## 2017-04-25 DIAGNOSIS — J851 Abscess of lung with pneumonia: Secondary | ICD-10-CM

## 2017-04-25 NOTE — Patient Instructions (Signed)
Please continue to change your dressing every day as often as you need to.

## 2017-04-25 NOTE — Progress Notes (Signed)
Mr. Fackler returns today in follow-up.  He states that he is not had any fevers or chills.  He has no cough.  He cut the Heimlich valve off of his tubes and placed the tube into a can so that he could carry it around easier.  He currently lives alone and has no primary care physician.  We will try to get him some help in that regard.  I have independently reviewed his chest x-ray.  This shows an improving right lower lobe pneumonia.  We cut his stitches today and advance the tube several centimeters.  We cut the tubes at the skin level.  They were then secured again.  He will come back in 1 week for further follow-up.

## 2017-04-25 NOTE — Telephone Encounter (Signed)
Patient dropped off disability paperwork he needs filled out, payment has been paid and placed in folder up front.

## 2017-05-02 ENCOUNTER — Ambulatory Visit (INDEPENDENT_AMBULATORY_CARE_PROVIDER_SITE_OTHER): Payer: 59 | Admitting: Cardiothoracic Surgery

## 2017-05-02 ENCOUNTER — Ambulatory Visit
Admission: RE | Admit: 2017-05-02 | Discharge: 2017-05-02 | Disposition: A | Discharge status: Home / Self Care | Payer: 59 | Source: Ambulatory Visit | Attending: Cardiothoracic Surgery | Admitting: Cardiothoracic Surgery

## 2017-05-02 ENCOUNTER — Encounter: Payer: Self-pay | Admitting: Cardiothoracic Surgery

## 2017-05-02 VITALS — BP 110/75 | HR 112 | Temp 98.0°F | Ht 71.0 in | Wt 120.0 lb

## 2017-05-02 DIAGNOSIS — J851 Abscess of lung with pneumonia: Secondary | ICD-10-CM

## 2017-05-02 MED ORDER — OXYCODONE-ACETAMINOPHEN 5-325 MG PO TABS: 1.0000 | ORAL_TABLET | Freq: Four times a day (QID) | ORAL | 0 refills | Status: DC | PRN

## 2017-05-02 NOTE — Progress Notes (Signed)
He returns today in follow-up.  He states he missed his appointment with Dr. Ola Spurr and he will try to reschedule that.  He denied any fevers or chills.  His thoracotomy wound is healing as expected.  His chest tube sites are clean.  We advance his tubes some.  He did have a chest x-ray today which shows only some postoperative changes.  I will see him back again in 1 week.

## 2017-05-02 NOTE — Patient Instructions (Signed)
We will see you back as listed below. Please have your chest x-ray prior to appointment.

## 2017-05-09 ENCOUNTER — Ambulatory Visit (INDEPENDENT_AMBULATORY_CARE_PROVIDER_SITE_OTHER): Payer: 59 | Admitting: Cardiothoracic Surgery

## 2017-05-09 ENCOUNTER — Ambulatory Visit
Admission: RE | Admit: 2017-05-09 | Discharge: 2017-05-09 | Disposition: A | Discharge status: Home / Self Care | Payer: 59 | Source: Ambulatory Visit | Attending: Cardiothoracic Surgery | Admitting: Cardiothoracic Surgery

## 2017-05-09 ENCOUNTER — Encounter: Payer: Self-pay | Admitting: Cardiothoracic Surgery

## 2017-05-09 VITALS — BP 106/72 | HR 108 | Temp 97.3°F | Wt 124.0 lb

## 2017-05-09 DIAGNOSIS — J851 Abscess of lung with pneumonia: Secondary | ICD-10-CM | POA: Diagnosis present

## 2017-05-09 DIAGNOSIS — J869 Pyothorax without fistula: Secondary | ICD-10-CM

## 2017-05-09 MED ORDER — OXYCODONE-ACETAMINOPHEN 5-325 MG PO TABS: 1.0000 | ORAL_TABLET | ORAL | 0 refills | Status: DC | PRN

## 2017-05-09 NOTE — Patient Instructions (Signed)
Please continue to change your dressing once a day.  We will schedule you an appointment with Dr. Enid Derry (primary care doctor).

## 2017-05-09 NOTE — Progress Notes (Signed)
No problems.  Last day of antibiotics is today.  He has not made appointment with Dr. Ola Spurr in ID despite our requests to do so.  He also has not made an appointment with a primary care physician as we have asked.  He still has some discomfort but not severe.  No fever.  Wounds are very clean.   Chest XRAy from today looks OK but still with mid lung opacities  Advanced his chest tubes today and resecured them.  Will see again on Monday.  Will make appointment with Dr. Sanda Klein for primary care and give 20 tablests of Percocet.  [employer].

## 2017-05-12 ENCOUNTER — Encounter: Payer: Self-pay | Admitting: Cardiothoracic Surgery

## 2017-05-12 ENCOUNTER — Ambulatory Visit (INDEPENDENT_AMBULATORY_CARE_PROVIDER_SITE_OTHER): Payer: 59 | Admitting: Cardiothoracic Surgery

## 2017-05-12 VITALS — BP 122/78 | HR 94 | Temp 98.1°F | Resp 13 | Ht 71.0 in | Wt 124.4 lb

## 2017-05-12 DIAGNOSIS — J869 Pyothorax without fistula: Secondary | ICD-10-CM

## 2017-05-12 NOTE — Progress Notes (Signed)
He returns today in follow-up.  We advanced his chest tubes.  His sites are all clean dry and intact.  He will come back to see [place] on Friday.

## 2017-05-12 NOTE — Patient Instructions (Signed)
We will send the referrals to [facility] and [facility] today. Please see your follow up appointment listed below.

## 2017-05-13 ENCOUNTER — Telehealth: Payer: Self-pay

## 2017-05-13 NOTE — Telephone Encounter (Signed)
F[facility]ed the below to [facility].  Order, demographic sheet, insurance card, last office note.  Spoke with Angie ( referral)  Has been received and she will verify coverage and call me back.

## 2017-05-14 NOTE — Telephone Encounter (Signed)
Spoke with [facility] -patient denied to [employer].     Spoke with [facility] and all necessary documents faxed to [phone] at this time.

## 2017-05-16 ENCOUNTER — Telehealth: Payer: Self-pay

## 2017-05-16 ENCOUNTER — Ambulatory Visit (INDEPENDENT_AMBULATORY_CARE_PROVIDER_SITE_OTHER): Payer: 59 | Admitting: Cardiothoracic Surgery

## 2017-05-16 ENCOUNTER — Encounter: Payer: Self-pay | Admitting: Cardiothoracic Surgery

## 2017-05-16 VITALS — BP 128/88 | HR 103 | Temp 98.3°F | Wt 123.0 lb

## 2017-05-16 DIAGNOSIS — J869 Pyothorax without fistula: Secondary | ICD-10-CM

## 2017-05-16 MED ORDER — OXYCODONE-ACETAMINOPHEN 5-325 MG PO TABS: 1.0000 | ORAL_TABLET | ORAL | 0 refills | Status: DC | PRN

## 2017-05-16 NOTE — Progress Notes (Signed)
He returns today in follow-up.  We removed all of his chest tubes.  They were only in approximately 4-5 cm each.  We packed the wounds.  He did request a refill on his narcotics which we provided.  He states he did see Dr. Ola Spurr and he gave him 2 additional weeks of antibiotics.  He will come back to see me in 2 weeks and he will be seen by the visiting nurses on a daily basis for wound care management.  He also has an appointment with Dr. Robby Sermon in primary care for establishment of his long-term follow-up.

## 2017-05-16 NOTE — Telephone Encounter (Signed)
Placed calls to the below for home health care agencies-  [facility] no longer in [place] care- denied [facility]- no response [facility] by [facility]- they will call back if they take Jennette call to [employer] and was provided [place] care and [place] as being in network- reference number [id number]  Spoke with Dr.Oaks and per Dr.Oaks Freda Munro will pack wounds Saturday and Sunday.

## 2017-05-16 NOTE — Patient Instructions (Signed)
Home health nurse will be coming to your house to change your strip daily.  We will see you back in 2 weeks and remember that you will need a chest X-Ray done prior to your appointment.

## 2017-05-17 ENCOUNTER — Telehealth: Payer: Self-pay

## 2017-05-17 LAB — ACID FAST CULTURE WITH REFLEXED SENSITIVITIES (MYCOBACTERIA)
Acid Fast Culture: NEGATIVE
Acid Fast Culture: NEGATIVE

## 2017-05-17 NOTE — Telephone Encounter (Signed)
Patient came into office today for wound check and to remove packing and place new packing.  Packing was removed and  Wound was cleaned with Hibiclens and water. Patted dry. Placed 1/4 inch plain packing material into all 4 open wound areas. Dry dressing was applied and secured with tape.   A little redness was observed, and yellowish color drainage on the old packing and dressing.  Patient denies fever, chills.   He will return to office Sunday 05/18/17 @ 10 am for wound care.

## 2017-05-18 NOTE — Telephone Encounter (Signed)
Patient came into office today for wound check and to remove packing and place new packing.  Packing was removed and  Wound was cleaned with Hibiclens and water. Patted dry. Placed 1/4 inch plain packing material into all 4 open wound areas. Dry dressing was applied and secured with tape.   A little redness was observed, and yellowish color drainage on the old packing and dressing.  Patient denies fever, chills.   He will return to office Monday 05/19/17 @ 8:15  am for wound care and packing.

## 2017-05-19 ENCOUNTER — Ambulatory Visit: Payer: 59

## 2017-05-19 NOTE — Telephone Encounter (Signed)
Patient came into office today for wound check and to remove packing and place new packing.  Packing was removed and Wound was cleaned with Hibiclens and water. Patted dry. Placed 1/4 inch plain packing material into all 4 open wound areas. Dry dressing was applied and secured with tape.  A little redness was observed, and yellowish color drainage on the old packing and dressing. Patient denies fever, chills.  He will return to office 3/11//19 @ 3 pm  for wound care and packing.

## 2017-05-20 ENCOUNTER — Telehealth: Payer: Self-pay

## 2017-05-20 ENCOUNTER — Ambulatory Visit: Payer: 59

## 2017-05-20 NOTE — Telephone Encounter (Signed)
Patient came into office today for wound check and to remove packing and place new packing.  Packing was removed and Wound was cleaned with Hibiclens and water. Patted dry. Placed 1/4 inch plain packing material into all 4 open wound areas. Dry dressing was applied and secured with tape.  A little redness was observed, and yellowish color drainage on the old packing and dressing. Patient denies fever, chills.  He will return to office3/02/2018 at 9:00 AM for wound care and packing at our [facility] location.

## 2017-05-21 ENCOUNTER — Ambulatory Visit: Payer: 59

## 2017-05-21 ENCOUNTER — Telehealth: Payer: Self-pay

## 2017-05-21 NOTE — Telephone Encounter (Signed)
Patient came into office today for wound check and to remove packing and place new packing.  Packing was removed and Wound was cleaned with Hibiclens and water. Patted dry. Placed 1/4 inch plain packing material into all 4 open wound areas. Dry dressing was applied and secured with tape.  A little redness was observed, pinkish watery drainage minimal amount,  and yellowish color drainage on the old packing and dressing. Patient denies fever, chills.  He will return to office3/13/2019 at 8:15 AMfor wound care and packing at our [facility] location.

## 2017-05-30 ENCOUNTER — Ambulatory Visit (INDEPENDENT_AMBULATORY_CARE_PROVIDER_SITE_OTHER): Payer: 59 | Admitting: Cardiothoracic Surgery

## 2017-05-30 ENCOUNTER — Encounter: Payer: Self-pay | Admitting: Cardiothoracic Surgery

## 2017-05-30 ENCOUNTER — Ambulatory Visit
Admission: RE | Admit: 2017-05-30 | Discharge: 2017-05-30 | Disposition: A | Discharge status: Home / Self Care | Payer: 59 | Source: Ambulatory Visit | Attending: Cardiothoracic Surgery | Admitting: Cardiothoracic Surgery

## 2017-05-30 VITALS — BP 134/102 | HR 108 | Temp 97.9°F | Resp 18 | Ht 71.0 in | Wt 123.6 lb

## 2017-05-30 DIAGNOSIS — J869 Pyothorax without fistula: Secondary | ICD-10-CM

## 2017-05-30 DIAGNOSIS — J984 Other disorders of lung: Secondary | ICD-10-CM | POA: Insufficient documentation

## 2017-05-30 NOTE — Patient Instructions (Signed)
Please give us a call in case you have any questions or concerns.  

## 2017-05-30 NOTE — Progress Notes (Signed)
He returns today in follow-up.  His only complaint is that he has some occasional muscle spasms in the front and the back.  He continues on his antibiotics which are almost finished.  He has not been using any oral narcotics.  He states that his breathing is been good.  He does have an appointment with a primary care physician for next month.  Today his chest x-ray shows continued improvement.  There remains some opacity in the right midlung aspect.  His lungs are clear but diminished slightly at the right base.  His heart is regular.  At the present time I did not see any need for any acute surgical intervention.  All of his wounds are healed as expected.  We will see him back on an as-needed basis.

## 2017-06-25 ENCOUNTER — Encounter: Payer: Self-pay | Admitting: Family Medicine

## 2017-06-25 ENCOUNTER — Ambulatory Visit (INDEPENDENT_AMBULATORY_CARE_PROVIDER_SITE_OTHER): Payer: 59 | Admitting: Family Medicine

## 2017-06-25 VITALS — BP 132/80 | HR 96 | Temp 98.1°F | Ht 71.0 in | Wt 136.0 lb

## 2017-06-25 DIAGNOSIS — R7303 Prediabetes: Secondary | ICD-10-CM | POA: Insufficient documentation

## 2017-06-25 DIAGNOSIS — Z87891 Personal history of nicotine dependence: Secondary | ICD-10-CM

## 2017-06-25 DIAGNOSIS — R946 Abnormal results of thyroid function studies: Secondary | ICD-10-CM

## 2017-06-25 DIAGNOSIS — I700 Atherosclerosis of aorta: Secondary | ICD-10-CM

## 2017-06-25 DIAGNOSIS — F1090 Alcohol use, unspecified, uncomplicated: Secondary | ICD-10-CM

## 2017-06-25 DIAGNOSIS — Z7289 Other problems related to lifestyle: Secondary | ICD-10-CM | POA: Insufficient documentation

## 2017-06-25 DIAGNOSIS — Z789 Other specified health status: Secondary | ICD-10-CM | POA: Diagnosis not present

## 2017-06-25 DIAGNOSIS — Z23 Encounter for immunization: Secondary | ICD-10-CM

## 2017-06-25 DIAGNOSIS — Z8709 Personal history of other diseases of the respiratory system: Secondary | ICD-10-CM | POA: Diagnosis not present

## 2017-06-25 DIAGNOSIS — D649 Anemia, unspecified: Secondary | ICD-10-CM | POA: Diagnosis not present

## 2017-06-25 DIAGNOSIS — Z1159 Encounter for screening for other viral diseases: Secondary | ICD-10-CM

## 2017-06-25 DIAGNOSIS — E871 Hypo-osmolality and hyponatremia: Secondary | ICD-10-CM | POA: Insufficient documentation

## 2017-06-25 NOTE — Assessment & Plan Note (Signed)
Check level today 

## 2017-06-25 NOTE — Progress Notes (Signed)
BP 132/80 (BP Location: Left Arm, Patient Position: Sitting, Cuff Size: Normal)   Pulse 96   Temp 98.1 F (36.7 C) (Oral)   Ht 5\' 11"  (1.803 m)   Wt 136 lb (61.7 kg)   SpO2 99%   BMI 18.97 kg/m    Subjective:    Patient ID: Kristopher [name], male    DOB: 1960-07-09, 57 y.o.   MRN: [id number]  HPI: Kristopher [name] is a 57 y.o. male  Chief Complaint  Patient presents with  . Establish Care    Please discuss colonoscopy and tdap     HPI Patient is here to establish care Had pneumonia around Christmas, and then Dr. 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, gets a little winded Quit smoking 02/25/18 Prediabetes; no one in the family; drinks some calories; regular sodas; not much Coca-Cola, some lemonade; likes iced tea, not much; likes beer Anemia; reviewed CBC readaings; no blood in the stool; no blood in the urine Na+ and Cl- were low in the hospital Pain along the right flank; continuous annoyance; loosens up with movement; gets stiff when sitting still CAD and aortic athero noted on the chest CT; he was not aware; mother may have had high chol; no hx of MI or stroke  Depression screen [facility] 2/9 06/25/2017  Decreased Interest 0  Down, Depressed, Hopeless 0  PHQ - 2 Score 0    Relevant past medical, surgical, family and social history reviewed Past Medical History:  Diagnosis Date  . Alcohol abuse   . Anemia   . Electric injury   . PAF (paroxysmal atrial fibrillation) (HCC)    a. in the setting of PNA with empyema; b. CHADS2VASc => 0; c. TTE 1/19: EF of 55-60%, unable to exlcude RWMA, mitral valve with  systolic bowing without prolapse, mildly dilated RV cavity size with normal RV systolic function, mildly dilated right atrium  . PNA (pneumonia)    a. 2/20: complicated by right-sided empyema which was treated with chest tube placement and subsequent bronchoscopy and right thoractomy with decortication and pleural drainage of lung abscess  . Thrombocytosis ([place])    Past Surgical History:  Procedure Laterality Date  . DECORTICATION N/A 04/03/2017   Procedure: DECORTICATION;  Surgeon: Nestor Lewandowsky, MD;  Location: [facility] ORS;  Service: Thoracic;  Laterality: N/A;  debridement of lung abscess  . SKIN GRAFT    . THORACOTOMY Right 04/03/2017   Procedure: THORACOTOMY MAJOR;  Surgeon: Nestor Lewandowsky, MD;  Location: [facility] ORS;  Service: Thoracic;  Laterality: Right;  Marland Kitchen VIDEO BRONCHOSCOPY N/A 04/03/2017   Procedure: PREOP BRONCHOSCOPY;  Surgeon: Nestor Lewandowsky, MD;  Location: [facility] ORS;  Service: Thoracic;  Laterality: N/A;   Family History  Problem Relation Age of Onset  . Atrial fibrillation Mother   . Dementia Mother    Social History   Tobacco Use  . Smoking status: Former Smoker    Packs/day: 1.50    Years: 35.00    Pack years: 52.50    Types: Cigarettes    Last attempt to quit: 02/25/2017  Years since quitting: 0.3  . Smokeless tobacco: Never Used  Substance Use Topics  . Alcohol use: Yes  . Drug use: No    Interim medical history since last visit reviewed. Allergies and medications reviewed  Review of Systems Per HPI unless specifically indicated above     Objective:    BP 132/80 (BP Location: Left Arm, Patient Position: Sitting, Cuff Size: Normal)   Pulse 96   Temp 98.1 F (36.7 C) (Oral)   Ht 5\' 11"  (1.803 m)   Wt 136 lb (61.7 kg)   SpO2 99%   BMI 18.97 kg/m   Wt Readings from Last 3 Encounters:  06/25/17 136 lb (61.7 kg)  05/30/17 123 lb 9.6 oz (56.1 kg)  05/16/17 123 lb (55.8 kg)    Physical Exam  Constitutional: He appears well-developed and well-nourished. No  distress.  HENT:  Head: Normocephalic and atraumatic.  Eyes: EOM are normal. No scleral icterus.  Neck: No thyromegaly present.  Cardiovascular: Normal rate and regular rhythm.  Pulmonary/Chest: Effort normal and breath sounds normal.  Abdominal: Soft. Bowel sounds are normal. He exhibits no distension.  Musculoskeletal: He exhibits no edema.  Neurological: Coordination normal.  Skin: Skin is warm and dry. No pallor.  Psychiatric: He has a normal mood and affect. His behavior is normal. Judgment and thought content normal.       Assessment & Plan:   Problem List Items Addressed This Visit      Cardiovascular and Mediastinum   Aortic atherosclerosis ([place])    Reviewed with patient; goal LDL less than 70      Relevant Orders   Lipid panel (Completed)     Other   Prediabetes - Primary   Relevant Orders   Hemoglobin A1c (Completed)   Hyponatremia    Check level today      Relevant Orders   COMPLETE METABOLIC PANEL WITH GFR (Completed)   Hx of tobacco use, presenting hazards to health    Yearly chest CT until he has been quit for 15 years (2033)      Hx of pleural empyema    Followed by Dr. Genevive Bi      Anemia    Check labs, reviewed last 4 H/H readings; never had a colonoscopy; patient declined colonoscopy for now; stool cards given, patient to return      Relevant Orders   CBC with Differential/Platelet (Completed)   Fe+TIBC+Fer (Completed)   B12 and Folate Panel (Completed)   Alcohol use    Limit alcohol to no more than 14 drinks per week, and he says it will be a goal; offered help if needed      Abnormal thyroid exam    Check TSH and free T4      Relevant Orders   TSH + free T4 (Completed)    Other Visit Diagnoses    Need for hepatitis C screening test       Relevant Orders   Hepatitis C Antibody   Need for diphtheria-tetanus-pertussis (Tdap) vaccine       Relevant Orders   Tdap vaccine greater than or equal to 7yo IM (Completed)       Follow up  plan: Return in about 3 months (around 09/24/2017) for complete physical.  An after-visit summary was printed and given to the patient at [place].  Please see the patient instructions which may contain other information and recommendations beyond what is mentioned above in the assessment and plan.  No orders of the defined types were placed  in this encounter.   Orders Placed This Encounter  Procedures  . Tdap vaccine greater than or equal to 7yo IM  . CBC with Differential/Platelet  . COMPLETE METABOLIC PANEL WITH GFR  . Hemoglobin A1c  . Lipid panel  . TSH + free T4  . Fe+TIBC+Fer  . B12 and Folate Panel  . Hepatitis C Antibody  . Hepatitis C antibody

## 2017-06-25 NOTE — Assessment & Plan Note (Signed)
Check labs, reviewed last 4 H/H readings; never had a colonoscopy; patient declined colonoscopy for now; stool cards given, patient to return

## 2017-06-25 NOTE — Assessment & Plan Note (Signed)
Reviewed with patient; goal LDL less than 70

## 2017-06-25 NOTE — Assessment & Plan Note (Signed)
Limit alcohol to no more than 14 drinks per week, and he says it will be a goal; offered help if needed

## 2017-06-25 NOTE — Patient Instructions (Addendum)
We'll get labs today If you have not heard anything from my staff in a week about any orders/referrals/studies from today, please contact us here to follow-up ([phone]) [phone] You received the vaccine to protect against tetanus and diphtheria and pertussis today; the tetanus and diphtheria portions will provide protection up to ten years, and the pertussis component will give you protection against whooping cough for life  Atherosclerosis Atherosclerosis is narrowing and hardening of the blood vessels (arteries). Arteries are tubes that carry blood that contains oxygen from the heart to all parts of the body. Arteries can become narrow or clogged with a buildup of fat, cholesterol, calcium, or other substances (plaque). Plaque decreases the amount of blood that can flow through the artery. Atherosclerosis can affect any artery in the body, including:  Heart arteries (coronary artery disease), which may cause heart attack.  Brain arteries, which may cause stroke.  Leg, arm, and pelvis arteries (peripheral artery disease), which may cause pain and numbness.  Kidney arteries, which may cause kidney (renal) failure.  Treatment may slow the disease and prevent further damage to the heart, brain, peripheral arteries, and kidneys. What are the causes? Atherosclerosis develops when plaque forms in an artery. This damages the inside wall of the artery. Over time, the plaque grows and hardens. It may break through the artery wall. This causes a blood clot to form over the break, which narrows the artery more. The clot may also break loose and travel to other arteries, causing more damage. What increases the risk? This condition is more likely to develop in people who:  Are middle age or older.  Have a family history of atherosclerosis.  Have high cholesterol.  Have high blood fats (triglycerides).  Have diabetes.  Are overweight.  Smoke tobacco.  Do not exercise enough.  Have a substance in  the blood that indicates increased levels of inflammation in the body (C-reactive protein, or CRP).  Have sleep apnea.  Are stressed.  Drink too much alcohol.  What are the signs or symptoms? This condition may not cause any symptoms. If you do have symptoms, they are caused by damage to an area of your body that is not getting enough blood. The following symptoms are possible:  Coronary artery disease may cause chest pain and shortness of breath.  Decreased blood supply to your brain may cause a stroke. Signs and symptoms of stroke may include sudden: ? Weakness on one side of the body. ? Confusion. ? Changes in vision. ? Inability to speak or understand speech. ? Loss of balance, coordination, or ability to walk. ? Severe headache. ? Loss of consciousness.  Peripheral artery disease may cause pain and numbness, often in the legs and hips.  Renal failure may cause fatigue, nausea, swelling, and itchy skin.  How is this diagnosed? This condition is diagnosed based on your medical history and a physical exam. During the exam, your health care provider will check your pulses and listen for a "whooshing" sound over your arteries (bruit). You may have tests, such as:  Blood tests to check your levels of cholesterol, triglycerides, and CRP.  Electrocardiogram (ECG) to check for heart damage.  Chest X-ray to see if your heart is enlarged, which is a sign of heart failure.  Stress test to see how your heart reacts to exercise.  Echocardiogram to get images of the inside of your heart.  Ankle-Brachial index to compare blood pressure in your arms to blood pressure in your ankles.  Ultrasound of  your peripheral arteries to check blood flow.  CT scan to check for damage to your heart or brain.  X-rays of blood vessels after dye has been injected (angiogram) to check blood flow.  How is this treated? Treatment starts with lifestyle changes, which may include:  Changing your  diet.  Losing weight.  Reducing stress.  Exercising and being more physically active.  Not smoking.  You also may need medicine to:  Lower triglycerides and cholesterol.  Lower and control blood pressure.  Prevent blood clots.  Lower inflammation in your body.  Lower and control your blood sugar.  Sometimes, surgery is needed to remove plaque, widen your artery, or create a new path for your blood (bypass). Surgical treatment may include:  Removing plaque from an artery (endarterectomy).  Opening a narrowed heart artery (angioplasty).  Heart or peripheral artery bypass graft surgery.  Follow these instructions at home: Lifestyle   Eat a heart-healthy diet. Talk to your health care provider or a diet specialist (dietitian) if you need help. A heart-healthy diet includes: ? Limiting unhealthy fats and increasing healthy fats. Some examples of healthy fats are olive oil and canola oil. ? Eating plant-based foods, such as fruits, vegetables, nuts, legumes, and whole grains.  Follow an exercise program as told by your health care provider.  Maintain a healthy weight. Lose weight if directed by your health care provider.  Rest when you are tired.  Learn to manage your stress.  Do not use any tobacco products, such as cigarettes, chewing tobacco, and e-cigarettes. If you need help quitting, ask your health care provider.  Limit alcohol intake to no more than 1 drink a day for nonpregnant women and 2 drinks a day for men. One drink equals 12 oz of beer, 5 oz of wine, or 1 oz of hard liquor.  Do not abuse drugs. General instructions  Take over-the-counter and prescription medicines only as told by your health care provider.  Manage other health conditions as told by your health care provider.  Keep all follow-up visits as told by your health care provider. This is important. Contact a health care provider if:  You have chest pain or discomfort. This includes  squeezing chest pain that may feel like indigestion (angina).  You have shortness of breath.  You have an irregular heartbeat.  You have unexplained fatigue.  You have unexplained pain or numbness in an arm, leg, or hip.  You have nausea, swelling of your hands or feet, and itchy skin. Get help right away if:  You have symptoms of a heart attack, such as: ? Chest pain. ? Shortness of breath. ? Pain in your neck, jaw, arms, back, or stomach. ? Cold sweat. ? Nausea. ? Light-headedness.  You have symptoms of a stroke, such as sudden: ? Weakness on one side of your body. ? Confusion. ? Changes in vision. ? Inability to speak or understand speech. ? Loss of balance, coordination, or ability to walk. ? Severe headache. ? Loss of consciousness. These symptoms may represent a serious problem that is an emergency. Do not wait to see if the symptoms will go away. Get medical help right away. Call your local emergency services (911 in the [place] Do not drive yourself to the hospital. This information is not intended to replace advice given to you by your health care provider. Make sure you discuss any questions you have with your health care provider. Document Released: 05/18/2003 Document Revised: 08/03/2015 Document Reviewed: 01/16/2015 Elsevier Interactive Patient  Education  2018 [employer].  Alcohol Use Disorder Alcohol use disorder is when your drinking disrupts your daily life. When you have this condition, you drink too much alcohol and you cannot control your drinking. Alcohol use disorder can cause serious problems with your physical health. It can affect your brain, heart, liver, pancreas, immune system, stomach, and intestines. Alcohol use disorder can increase your risk for certain cancers and cause problems with your mental health, such as depression, anxiety, psychosis, delirium, and dementia. People with this disorder risk hurting themselves and others. What are the  causes? This condition is caused by drinking too much alcohol over time. It is not caused by drinking too much alcohol only one or two times. Some people with this condition drink alcohol to cope with or escape from negative life events. Others drink to relieve pain or symptoms of mental illness. What increases the risk? You are more likely to develop this condition if:  You have a family history of alcohol use disorder.  Your culture encourages drinking to the point of intoxication, or makes alcohol easy to get.  You had a mood or conduct disorder in childhood.  You have been a victim of abuse.  You are an adolescent and: ? You have poor grades or difficulties in school. ? Your caregivers do not talk to you about saying no to alcohol, or supervise your activities. ? You are impulsive or you have trouble with self-control.  What are the signs or symptoms? Symptoms of this condition include:  Drinkingmore than you want to.  Drinking for longer than you want to.  Trying several times to drink less or to control your drinking.  Spending a lot of time getting alcohol, drinking, or recovering from drinking.  Craving alcohol.  Having problems at work, at school, or at home due to drinking.  Having problems in relationships due to drinking.  Drinking when it is dangerous to drink, such as before driving a car.  Continuing to drink even though you know you might have a physical or mental problem related to drinking.  Needing more and more alcohol to get the same effect you want from the alcohol (building up tolerance).  Having symptoms of withdrawal when you stop drinking. Symptoms of withdrawal include: ? Fatigue. ? Nightmares. ? Trouble sleeping. ? Depression. ? Anxiety. ? Fever. ? Seizures. ? Severe confusion. ? Feeling or seeing things that are not there (hallucinations). ? Tremors. ? Rapid heart rate. ? Rapid breathing. ? High blood pressure.  Drinking to avoid  symptoms of withdrawal.  How is this diagnosed? This condition is diagnosed with an assessment. Your health care provider may start the assessment by asking three or four questions about your drinking. Your health care provider may perform a physical exam or do lab tests to see if you have physical problems resulting from alcohol use. She or he may refer you to a mental health professional for evaluation. How is this treated? Some people with alcohol use disorder are able to reduce their alcohol use to low-risk levels. Others need to completely quit drinking alcohol. When necessary, mental health professionals with specialized training in substance use treatment can help. Your health care provider can help you decide how severe your alcohol use disorder is and what type of treatment you need. The following forms of treatment are available:  Detoxification. Detoxification involves quitting drinking and using prescription medicines within the first week to help lessen withdrawal symptoms. This treatment is important for people who  have had withdrawal symptoms before and for heavy drinkers who are likely to have withdrawal symptoms. Alcohol withdrawal can be dangerous, and in severe cases, it can cause death. Detoxification may be provided in a home, community, or primary care setting, or in a hospital or substance use treatment facility.  Counseling. This treatment is also called talk therapy. It is provided by substance use treatment counselors. A counselor can address the reasons you use alcohol and suggest ways to keep you from drinking again or to prevent problem drinking. The goals of talk therapy are to: ? Find healthy activities and ways for you to cope with stress. ? Identify and avoid the things that trigger your alcohol use. ? Help you learn how to handle cravings.  Medicines.Medicines can help treat alcohol use disorder by: ? Decreasing alcohol cravings. ? Decreasing the positive feeling  you have when you drink alcohol. ? Causing an uncomfortable physical reaction when you drink alcohol (aversion therapy).  Support groups. Support groups are led by people who have quit drinking. They provide emotional support, advice, and guidance.  These forms of treatment are often combined. Some people with this condition benefit from a combination of treatments provided by specialized substance use treatment centers. Follow these instructions at home:  Take over-the-counter and prescription medicines only as told by your health care provider.  Check with your health care provider before starting any new medicines.  Ask friends and family members not to offer you alcohol.  Avoid situations where alcohol is served, including gatherings where others are drinking alcohol.  Create a plan for what to do when you are tempted to use alcohol.  Find hobbies or activities that you enjoy that do not include alcohol.  Keep all follow-up visits as told by your health care provider. This is important. How is this prevented?  If you drink, limit alcohol intake to no more than 1 drink a day for nonpregnant women and 2 drinks a day for men. One drink equals 12 oz of beer, 5 oz of wine, or 1 oz of hard liquor.  If you have a mental health condition, get treatment and support.  Do not give alcohol to adolescents.  If you are an adolescent: ? Do not drink alcohol. ? Do not be afraid to say no if someone offers you alcohol. Speak up about why you do not want to drink. You can be a positive role model for your friends and set a good example for those around you by not drinking alcohol. ? If your friends drink, spend time with others who do not drink alcohol. Make new friends who do not use alcohol. ? Find healthy ways to manage stress and emotions, such as meditation or deep breathing, exercise, spending time in nature, listening to music, or talking with a trusted friend or family member. Contact a  health care provider if:  You are not able to take your medicines as told.  Your symptoms get worse.  You return to drinking alcohol (relapse) and your symptoms get worse. Get help right away if:  You have thoughts about hurting yourself or others. If you ever feel like you may hurt yourself or others, or have thoughts about taking your own life, get help right away. You can go to your nearest emergency department or call:  Your local emergency services (911 in the [place]  A suicide crisis helpline, such as the [facility] at 915-669-2379. This is open 24 hours a day.  Summary  Alcohol use disorder is when your drinking disrupts your daily life. When you have this condition, you drink too much alcohol and you cannot control your drinking.  Treatment may include detoxification, counseling, medicine, and support groups.  Ask friends and family members not to offer you alcohol. Avoid situations where alcohol is served.  Get help right away if you have thoughts about hurting yourself or others. This information is not intended to replace advice given to you by your health care provider. Make sure you discuss any questions you have with your health care provider. Document Released: 04/04/2004 Document Revised: 11/23/2015 Document Reviewed: 11/23/2015 Elsevier Interactive Patient Education  [employer].

## 2017-06-25 NOTE — Assessment & Plan Note (Signed)
Yearly chest CT until he has been quit for 15 years (2033)

## 2017-06-25 NOTE — Assessment & Plan Note (Signed)
Followed by Dr. Genevive Bi

## 2017-06-25 NOTE — Assessment & Plan Note (Signed)
Check TSH and free T4 

## 2017-06-26 LAB — CBC WITH DIFFERENTIAL/PLATELET
Basophils Absolute: 47 cells/uL (ref 0–200)
Basophils Relative: 0.6 %
EOS ABS: 94 {cells}/uL (ref 15–500)
EOS PCT: 1.2 %
HEMATOCRIT: 44.4 % (ref 38.5–50.0)
HEMOGLOBIN: 15 g/dL (ref 13.2–17.1)
LYMPHS ABS: 1279 {cells}/uL (ref 850–3900)
MCH: 30.2 pg (ref 27.0–33.0)
MCHC: 33.8 g/dL (ref 32.0–36.0)
MCV: 89.3 fL (ref 80.0–100.0)
MPV: 9.4 fL (ref 7.5–12.5)
Monocytes Relative: 11.9 %
NEUTROS PCT: 69.9 %
Neutro Abs: 5452 cells/uL (ref 1500–7800)
Platelets: 298 10*3/uL (ref 140–400)
RBC: 4.97 10*6/uL (ref 4.20–5.80)
RDW: 15.6 % — AB (ref 11.0–15.0)
Total Lymphocyte: 16.4 %
WBC mixed population: 928 cells/uL (ref 200–950)
WBC: 7.8 10*3/uL (ref 3.8–10.8)

## 2017-06-26 LAB — COMPLETE METABOLIC PANEL WITH GFR
AG RATIO: 1.5 (calc) (ref 1.0–2.5)
ALBUMIN MSPROF: 4.5 g/dL (ref 3.6–5.1)
ALKALINE PHOSPHATASE (APISO): 81 U/L (ref 40–115)
ALT: 26 U/L (ref 9–46)
AST: 28 U/L (ref 10–35)
BILIRUBIN TOTAL: 0.3 mg/dL (ref 0.2–1.2)
BUN: 10 mg/dL (ref 7–25)
CO2: 30 mmol/L (ref 20–32)
Calcium: 9.7 mg/dL (ref 8.6–10.3)
Chloride: 100 mmol/L (ref 98–110)
Creat: 0.76 mg/dL (ref 0.70–1.33)
GFR, Est African American: 117 mL/min/{1.73_m2} (ref >=60)
GFR, Est Non African American: 101 mL/min/{1.73_m2} (ref >=60)
GLOBULIN: 3.1 g/dL (ref 1.9–3.7)
Glucose, Bld: 85 mg/dL (ref 65–139)
POTASSIUM: 4.3 mmol/L (ref 3.5–5.3)
SODIUM: 138 mmol/L (ref 135–146)
Total Protein: 7.6 g/dL (ref 6.1–8.1)

## 2017-06-26 LAB — LIPID PANEL
CHOL/HDL RATIO: 2.5 (calc) (ref <5.0)
Cholesterol: 229 mg/dL — ABNORMAL HIGH (ref <200)
HDL: 91 mg/dL (ref >40)
LDL Cholesterol (Calc): 107 mg/dL (calc) — ABNORMAL HIGH
Non-HDL Cholesterol (Calc): 138 mg/dL (calc) — ABNORMAL HIGH (ref <130)
Triglycerides: 184 mg/dL — ABNORMAL HIGH (ref <150)

## 2017-06-26 LAB — B12 AND FOLATE PANEL
Folate: 23.3 ng/mL
VITAMIN B 12: 382 pg/mL (ref 200–1100)

## 2017-06-26 LAB — HEMOGLOBIN A1C
HEMOGLOBIN A1C: 5.1 %{Hb} (ref <5.7)
MEAN PLASMA GLUCOSE: 100 (calc)
eAG (mmol/L): 5.5 (calc)

## 2017-06-26 LAB — IRON,TIBC AND FERRITIN PANEL
%SAT: 33 % (ref 15–60)
FERRITIN: 330 ng/mL (ref 20–380)
IRON: 110 ug/dL (ref 50–180)
TIBC: 329 mcg/dL (calc) (ref 250–425)

## 2017-06-26 LAB — HEPATITIS C ANTIBODY
Hepatitis C Ab: NONREACTIVE
SIGNAL TO CUT-OFF: 0.07 (ref <1.00)

## 2017-06-26 LAB — TSH+FREE T4: TSH W/REFLEX TO FT4: 1.37 mIU/L (ref 0.40–4.50)

## 2017-06-28 ENCOUNTER — Other Ambulatory Visit: Payer: Self-pay | Admitting: Family Medicine

## 2017-06-28 DIAGNOSIS — E785 Hyperlipidemia, unspecified: Secondary | ICD-10-CM | POA: Insufficient documentation

## 2017-06-28 DIAGNOSIS — E538 Deficiency of other specified B group vitamins: Secondary | ICD-10-CM | POA: Insufficient documentation

## 2017-06-28 MED ORDER — ATORVASTATIN CALCIUM 10 MG PO TABS: 10.0000 mg | ORAL_TABLET | Freq: Every day | ORAL | 1 refills | Status: DC

## 2017-06-28 NOTE — Assessment & Plan Note (Signed)
Start OTC 

## 2017-06-28 NOTE — Assessment & Plan Note (Signed)
Start 10 mg atorvastatin

## 2017-06-28 NOTE — Progress Notes (Signed)
Atorvastatin, recommended OTC vit B12, decrease EtOH

## 2017-09-09 ENCOUNTER — Encounter: Payer: Self-pay | Admitting: Family Medicine

## 2017-09-09 DIAGNOSIS — Z1159 Encounter for screening for other viral diseases: Secondary | ICD-10-CM | POA: Insufficient documentation

## 2017-09-25 ENCOUNTER — Ambulatory Visit (INDEPENDENT_AMBULATORY_CARE_PROVIDER_SITE_OTHER): Payer: 59 | Admitting: Family Medicine

## 2017-09-25 ENCOUNTER — Encounter: Payer: Self-pay | Admitting: Family Medicine

## 2017-09-25 VITALS — BP 144/82 | HR 93 | Resp 14 | Ht 70.38 in | Wt 139.9 lb

## 2017-09-25 DIAGNOSIS — Z789 Other specified health status: Secondary | ICD-10-CM

## 2017-09-25 DIAGNOSIS — Z87891 Personal history of nicotine dependence: Secondary | ICD-10-CM

## 2017-09-25 DIAGNOSIS — Z125 Encounter for screening for malignant neoplasm of prostate: Secondary | ICD-10-CM | POA: Insufficient documentation

## 2017-09-25 DIAGNOSIS — Z0001 Encounter for general adult medical examination with abnormal findings: Secondary | ICD-10-CM | POA: Diagnosis not present

## 2017-09-25 DIAGNOSIS — G8929 Other chronic pain: Secondary | ICD-10-CM

## 2017-09-25 DIAGNOSIS — M546 Pain in thoracic spine: Secondary | ICD-10-CM

## 2017-09-25 DIAGNOSIS — D485 Neoplasm of uncertain behavior of skin: Secondary | ICD-10-CM | POA: Diagnosis not present

## 2017-09-25 DIAGNOSIS — Z0000 Encounter for general adult medical examination without abnormal findings: Secondary | ICD-10-CM | POA: Insufficient documentation

## 2017-09-25 DIAGNOSIS — Z7289 Other problems related to lifestyle: Secondary | ICD-10-CM

## 2017-09-25 DIAGNOSIS — I700 Atherosclerosis of aorta: Secondary | ICD-10-CM | POA: Diagnosis not present

## 2017-09-25 DIAGNOSIS — Z1211 Encounter for screening for malignant neoplasm of colon: Secondary | ICD-10-CM

## 2017-09-25 NOTE — Patient Instructions (Addendum)
I've put in a referral to the pain clinic and the dermatologist Someone should contact you soon If you have not heard anything from my staff in a week about any orders/referrals/studies from today, please contact us here to follow-up [phone]  Consider getting the new shingles vaccine called Shingrix; that is available for individuals 58 years of age and older, and is recommended even if you have had shingles in the past and/or already received the old shingles vaccine (Zostavax); it is a two-part series, and is available at many local pharmacies  I'll recommend that you get a low dose chest CT in January 2020; call us for the orders and how to get that scheduled   Alcohol Use Disorder Alcohol use disorder is when your drinking disrupts your daily life. When you have this condition, you drink too much alcohol and you cannot control your drinking. Alcohol use disorder can cause serious problems with your physical health. It can affect your brain, heart, liver, pancreas, immune system, stomach, and intestines. Alcohol use disorder can increase your risk for certain cancers and cause problems with your mental health, such as depression, anxiety, psychosis, delirium, and dementia. People with this disorder risk hurting themselves and others. What are the causes? This condition is caused by drinking too much alcohol over time. It is not caused by drinking too much alcohol only one or two times. Some people with this condition drink alcohol to cope with or escape from negative life events. Others drink to relieve pain or symptoms of mental illness. What increases the risk? You are more likely to develop this condition if:  You have a family history of alcohol use disorder.  Your culture encourages drinking to the point of intoxication, or makes alcohol easy to get.  You had a mood or conduct disorder in childhood.  You have been a victim of abuse.  You are an adolescent and: ? You have poor  grades or difficulties in school. ? Your caregivers do not talk to you about saying no to alcohol, or supervise your activities. ? You are impulsive or you have trouble with self-control.  What are the signs or symptoms? Symptoms of this condition include:  Drinkingmore than you want to.  Drinking for longer than you want to.  Trying several times to drink less or to control your drinking.  Spending a lot of time getting alcohol, drinking, or recovering from drinking.  Craving alcohol.  Having problems at work, at school, or at home due to drinking.  Having problems in relationships due to drinking.  Drinking when it is dangerous to drink, such as before driving a car.  Continuing to drink even though you know you might have a physical or mental problem related to drinking.  Needing more and more alcohol to get the same effect you want from the alcohol (building up tolerance).  Having symptoms of withdrawal when you stop drinking. Symptoms of withdrawal include: ? Fatigue. ? Nightmares. ? Trouble sleeping. ? Depression. ? Anxiety. ? Fever. ? Seizures. ? Severe confusion. ? Feeling or seeing things that are not there (hallucinations). ? Tremors. ? Rapid heart rate. ? Rapid breathing. ? High blood pressure.  Drinking to avoid symptoms of withdrawal.  How is this diagnosed? This condition is diagnosed with an assessment. Your health care provider may start the assessment by asking three or four questions about your drinking. Your health care provider may perform a physical exam or do lab tests to see if you have physical  problems resulting from alcohol use. She or he may refer you to a mental health professional for evaluation. How is this treated? Some people with alcohol use disorder are able to reduce their alcohol use to low-risk levels. Others need to completely quit drinking alcohol. When necessary, mental health professionals with specialized training in substance  use treatment can help. Your health care provider can help you decide how severe your alcohol use disorder is and what type of treatment you need. The following forms of treatment are available:  Detoxification. Detoxification involves quitting drinking and using prescription medicines within the first week to help lessen withdrawal symptoms. This treatment is important for people who have had withdrawal symptoms before and for heavy drinkers who are likely to have withdrawal symptoms. Alcohol withdrawal can be dangerous, and in severe cases, it can cause death. Detoxification may be provided in a home, community, or primary care setting, or in a hospital or substance use treatment facility.  Counseling. This treatment is also called talk therapy. It is provided by substance use treatment counselors. A counselor can address the reasons you use alcohol and suggest ways to keep you from drinking again or to prevent problem drinking. The goals of talk therapy are to: ? Find healthy activities and ways for you to cope with stress. ? Identify and avoid the things that trigger your alcohol use. ? Help you learn how to handle cravings.  Medicines.Medicines can help treat alcohol use disorder by: ? Decreasing alcohol cravings. ? Decreasing the positive feeling you have when you drink alcohol. ? Causing an uncomfortable physical reaction when you drink alcohol (aversion therapy).  Support groups. Support groups are led by people who have quit drinking. They provide emotional support, advice, and guidance.  These forms of treatment are often combined. Some people with this condition benefit from a combination of treatments provided by specialized substance use treatment centers. Follow these instructions at home:  Take over-the-counter and prescription medicines only as told by your health care provider.  Check with your health care provider before starting any new medicines.  Ask friends and family  members not to offer you alcohol.  Avoid situations where alcohol is served, including gatherings where others are drinking alcohol.  Create a plan for what to do when you are tempted to use alcohol.  Find hobbies or activities that you enjoy that do not include alcohol.  Keep all follow-up visits as told by your health care provider. This is important. How is this prevented?  If you drink, limit alcohol intake to no more than 1 drink a day for nonpregnant women and 2 drinks a day for men. One drink equals 12 oz of beer, 5 oz of wine, or 1 oz of hard liquor.  If you have a mental health condition, get treatment and support.  Do not give alcohol to adolescents.  If you are an adolescent: ? Do not drink alcohol. ? Do not be afraid to say no if someone offers you alcohol. Speak up about why you do not want to drink. You can be a positive role model for your friends and set a good example for those around you by not drinking alcohol. ? If your friends drink, spend time with others who do not drink alcohol. Make new friends who do not use alcohol. ? Find healthy ways to manage stress and emotions, such as meditation or deep breathing, exercise, spending time in nature, listening to music, or talking with a trusted friend or family  member. Contact a health care provider if:  You are not able to take your medicines as told.  Your symptoms get worse.  You return to drinking alcohol (relapse) and your symptoms get worse. Get help right away if:  You have thoughts about hurting yourself or others. If you ever feel like you may hurt yourself or others, or have thoughts about taking your own life, get help right away. You can go to your nearest emergency department or call:  Your local emergency services (911 in the [place]  A suicide crisis helpline, such as the [place] at 716-181-6410. This is open 24 hours a day.  Summary  Alcohol use disorder is when  your drinking disrupts your daily life. When you have this condition, you drink too much alcohol and you cannot control your drinking.  Treatment may include detoxification, counseling, medicine, and support groups.  Ask friends and family members not to offer you alcohol. Avoid situations where alcohol is served.  Get help right away if you have thoughts about hurting yourself or others. This information is not intended to replace advice given to you by your health care provider. Make sure you discuss any questions you have with your health care provider. Document Released: 04/04/2004 Document Revised: 11/23/2015 Document Reviewed: 11/23/2015 Elsevier Interactive Patient Education  2018 [place] Eating Plan DASH stands for "Dietary Approaches to Stop Hypertension." The DASH eating plan is a healthy eating plan that has been shown to reduce high blood pressure (hypertension). It may also reduce your risk for type 2 diabetes, heart disease, and stroke. The DASH eating plan may also help with weight loss. What are tips for following this plan? General guidelines  Avoid eating more than 2,300 mg (milligrams) of salt (sodium) a day. If you have hypertension, you may need to reduce your sodium intake to 1,500 mg a day.  Limit alcohol intake to no more than 1 drink a day for nonpregnant women and 2 drinks a day for men. One drink equals 12 oz of beer, 5 oz of wine, or 1 oz of hard liquor.  Work with your health care provider to maintain a healthy body weight or to lose weight. Ask what an ideal weight is for you.  Get at least 30 minutes of exercise that causes your heart to beat faster (aerobic exercise) most days of the week. Activities may include walking, swimming, or biking.  Work with your health care provider or diet and nutrition specialist (dietitian) to adjust your eating plan to your individual calorie needs. Reading food labels  Check food labels for the amount of  sodium per serving. Choose foods with less than 5 percent of the Daily Value of sodium. Generally, foods with less than 300 mg of sodium per serving fit into this eating plan.  To find whole grains, look for the word "whole" as the first word in the ingredient list. Shopping  Buy products labeled as "low-sodium" or "no salt added."  Buy fresh foods. Avoid canned foods and premade or frozen meals. Cooking  Avoid adding salt when cooking. Use salt-free seasonings or herbs instead of table salt or sea salt. Check with your health care provider or pharmacist before using salt substitutes.  Do not fry foods. Cook foods using healthy methods such as baking, boiling, grilling, and broiling instead.  Cook with heart-healthy oils, such as olive, canola, soybean, or sunflower oil. Meal planning   Eat a balanced diet that includes: ? 5  or more servings of fruits and vegetables each day. At each meal, try to fill half of your plate with fruits and vegetables. ? Up to 6-8 servings of whole grains each day. ? Less than 6 oz of lean meat, poultry, or fish each day. A 3-oz serving of meat is about the same size as a deck of cards. One egg equals 1 oz. ? 2 servings of low-fat dairy each day. ? A serving of nuts, seeds, or beans 5 times each week. ? Heart-healthy fats. Healthy fats called Omega-3 fatty acids are found in foods such as flaxseeds and coldwater fish, like sardines, salmon, and mackerel.  Limit how much you eat of the following: ? Canned or prepackaged foods. ? Food that is high in trans fat, such as fried foods. ? Food that is high in saturated fat, such as fatty meat. ? Sweets, desserts, sugary drinks, and other foods with added sugar. ? Full-fat dairy products.  Do not salt foods before eating.  Try to eat at least 2 vegetarian meals each week.  Eat more home-cooked food and less restaurant, buffet, and fast food.  When eating at a restaurant, ask that your food be prepared with  less salt or no salt, if possible. What foods are recommended? The items listed may not be a complete list. Talk with your dietitian about what dietary choices are best for you. Grains Whole-grain or whole-wheat bread. Whole-grain or whole-wheat pasta. Brown rice. [place]. Bulgur. Whole-grain and low-sodium cereals. Pita bread. Low-fat, low-sodium crackers. Whole-wheat flour tortillas. Vegetables Fresh or frozen vegetables (raw, steamed, roasted, or grilled). Low-sodium or reduced-sodium tomato and vegetable juice. Low-sodium or reduced-sodium tomato sauce and tomato paste. Low-sodium or reduced-sodium canned vegetables. Fruits All fresh, dried, or frozen fruit. Canned fruit in natural juice (without added sugar). Meat and other protein foods Skinless chicken or [place]. Ground chicken or [place]. Pork with fat trimmed off. Fish and seafood. Egg whites. Dried beans, peas, or lentils. Unsalted nuts, nut butters, and seeds. Unsalted canned beans. Lean cuts of beef with fat trimmed off. Low-sodium, lean deli meat. Dairy Low-fat (1%) or fat-free (skim) milk. Fat-free, low-fat, or reduced-fat cheeses. Nonfat, low-sodium ricotta or cottage cheese. Low-fat or nonfat yogurt. Low-fat, low-sodium cheese. Fats and oils Soft margarine without trans fats. Vegetable oil. Low-fat, reduced-fat, or light mayonnaise and salad dressings (reduced-sodium). Canola, safflower, olive, soybean, and sunflower oils. Avocado. Seasoning and other foods Herbs. Spices. Seasoning mixes without salt. Unsalted popcorn and pretzels. Fat-free sweets. What foods are not recommended? The items listed may not be a complete list. Talk with your dietitian about what dietary choices are best for you. Grains Baked goods made with fat, such as croissants, muffins, or some breads. Dry pasta or rice meal packs. Vegetables Creamed or fried vegetables. Vegetables in a cheese sauce. Regular canned vegetables (not low-sodium or  reduced-sodium). Regular canned tomato sauce and paste (not low-sodium or reduced-sodium). Regular tomato and vegetable juice (not low-sodium or reduced-sodium). Angie Fava. Olives. Fruits Canned fruit in a light or heavy syrup. Fried fruit. Fruit in cream or butter sauce. Meat and other protein foods Fatty cuts of meat. Ribs. Fried meat. Berniece Salines. Sausage. Bologna and other processed lunch meats. Salami. Fatback. Hotdogs. Bratwurst. Salted nuts and seeds. Canned beans with added salt. Canned or smoked fish. Whole eggs or egg yolks. Chicken or [place] with skin. Dairy Whole or 2% milk, cream, and half-and-half. Whole or full-fat cream cheese. Whole-fat or sweetened yogurt. Full-fat cheese. Nondairy creamers. Whipped toppings. Processed cheese  and cheese spreads. Fats and oils Butter. Stick margarine. Lard. Shortening. Ghee. Bacon fat. Tropical oils, such as coconut, palm kernel, or palm oil. Seasoning and other foods Salted popcorn and pretzels. Onion salt, garlic salt, seasoned salt, table salt, and sea salt. Worcestershire sauce. Tartar sauce. Barbecue sauce. Teriyaki sauce. Soy sauce, including reduced-sodium. Steak sauce. Canned and packaged gravies. Fish sauce. Oyster sauce. Cocktail sauce. Horseradish that you find on the shelf. Ketchup. Mustard. Meat flavorings and tenderizers. Bouillon cubes. Hot sauce and Tabasco sauce. Premade or packaged marinades. Premade or packaged taco seasonings. Relishes. Regular salad dressings. Where to find more information:  National Heart, Lung, and [place]: https://wilson-eaton.com/  American Heart Association: www.heart.org Summary  The DASH eating plan is a healthy eating plan that has been shown to reduce high blood pressure (hypertension). It may also reduce your risk for type 2 diabetes, heart disease, and stroke.  With the DASH eating plan, you should limit salt (sodium) intake to 2,300 mg a day. If you have hypertension, you may need to reduce your sodium  intake to 1,500 mg a day.  When on the DASH eating plan, aim to eat more fresh fruits and vegetables, whole grains, lean proteins, low-fat dairy, and heart-healthy fats.  Work with your health care provider or diet and nutrition specialist (dietitian) to adjust your eating plan to your individual calorie needs. This information is not intended to replace advice given to you by your health care provider. Make sure you discuss any questions you have with your health care provider. Document Released: 02/14/2011 Document Revised: 02/19/2016 Document Reviewed: 02/19/2016 Elsevier Interactive Patient Education  [employer].

## 2017-09-25 NOTE — Assessment & Plan Note (Signed)
Check PSA today. 

## 2017-09-25 NOTE — Progress Notes (Signed)
Patient ID: Kristopher [name], male   DOB: 1960-11-01, 57 y.o.   MRN: [id number]   Subjective:   Kristopher [name] is a 57 y.o. male here for a complete physical exam  Interim issues since last visit: no medical excitement since last visit He talked about hurting; he has pain along the right side; numbness along the right side and up the back; from the surgery; open to pain clinic referral  USPSTF grade A and B recommendations Depression:  Depression screen [facility] 2/9 09/25/2017 06/25/2017  Decreased Interest 0 0  Down, Depressed, Hopeless 3 0  PHQ - 2 Score 3 0  Altered sleeping 0 -  Tired, decreased energy 3 -  Change in appetite 1 -  Feeling bad or failure about yourself  0 -  Trouble concentrating 0 -  Moving slowly or fidgety/restless 0 -  Suicidal thoughts 0 -  PHQ-9 Score 7 -  Difficult doing work/chores Not difficult at all -  used to take zoloft; willing to consider but not right now  Hypertension: not checking BP at home; not much salt BP Readings from Last 3 Encounters:  09/25/17 (!) 144/82  06/25/17 132/80  05/30/17 (!) 134/102   Obesity: gaining weight back Wt Readings from Last 3 Encounters:  09/25/17 139 lb 14.4 oz (63.5 kg)  06/25/17 136 lb (61.7 kg)  05/30/17 123 lb 9.6 oz (56.1 kg)   BMI Readings from Last 3 Encounters:  09/25/17 19.86 kg/m  06/25/17 18.97 kg/m  05/30/17 17.24 kg/m    Immunizations: tetatnus UTD; discussed shingles vaccine Skin cancer: he has a dark spot on the left cheek  Lung cancer:  Just had CT scan Jan 2019; quit smoking Dec 2018; smoked 40 years Prostate cancer: no fam hx; no symptoms No results found for: PSA Colorectal cancer: interested in cologuard AAA: n/a Aspirin: taking aspirin for pain, follow directions on bottle Diet: pretty good eater, not big on fast food; likes his veggies Exercise: works and is active, very active Alcohol: six pack a day; using it as a crutch for pain   Office Visit from 09/25/2017 in [facility]  AUDIT-C Score  7    Tobacco use: quit 2018 HIV, hep B, hep C:  STD testing and prevention (chl/gon/syphilis):  Glucose:  Glucose, Bld  Date Value Ref Range Status  06/25/2017 85 65 - 139 mg/dL Final    Comment:    .        Non-fasting reference interval .   04/07/2017 95 65 - 99 mg/dL Final  04/05/2017 128 (H) 65 - 99 mg/dL Final   Glucose-Capillary  Date Value Ref Range Status  04/03/2017 99 65 - 99 mg/dL Final  03/29/2017 143 (H) 65 - 99 mg/dL Final   Lipids:  Lab Results  Component Value Date   CHOL 229 (H) 06/25/2017   Lab Results  Component Value Date   HDL 91 06/25/2017   Lab Results  Component Value Date   LDLCALC 107 (H) 06/25/2017   Lab Results  Component Value Date   TRIG 184 (H) 06/25/2017   Lab Results  Component Value Date   CHOLHDL 2.5 06/25/2017   No results found for: LDLDIRECT   Past Medical History:  Diagnosis Date  . Alcohol abuse   . Anemia   . Electric injury   . PAF (paroxysmal atrial fibrillation) (HCC)    a. in the setting of PNA with empyema; b. CHADS2VASc => 0; c. TTE 1/19: EF of 55-60%, unable  to exlcude RWMA, mitral valve with systolic bowing without prolapse, mildly dilated RV cavity size with normal RV systolic function, mildly dilated right atrium  . PNA (pneumonia)    a. 7/06: complicated by right-sided empyema which was treated with chest tube placement and subsequent bronchoscopy and right thoractomy with decortication and pleural drainage of lung abscess  . Thrombocytosis ([place])    Past Surgical History:  Procedure Laterality Date  . DECORTICATION N/A 04/03/2017   Procedure: DECORTICATION;  Surgeon: Nestor Lewandowsky, MD;  Location: [facility] ORS;  Service: Thoracic;  Laterality: N/A;  debridement of lung abscess  . SKIN GRAFT    . THORACOTOMY Right 04/03/2017   Procedure: THORACOTOMY MAJOR;  Surgeon: Nestor Lewandowsky, MD;  Location: [facility] ORS;  Service: Thoracic;  Laterality: Right;  Marland Kitchen VIDEO BRONCHOSCOPY N/A  04/03/2017   Procedure: PREOP BRONCHOSCOPY;  Surgeon: Nestor Lewandowsky, MD;  Location: [facility] ORS;  Service: Thoracic;  Laterality: N/A;   Family History  Problem Relation Age of Onset  . Atrial fibrillation Mother   . Dementia Mother    Social History   Tobacco Use  . Smoking status: Former Smoker    Packs/day: 1.50    Years: 35.00    Pack years: 52.50    Types: Cigarettes    Last attempt to quit: 02/25/2017    Years since quitting: 0.5  . Smokeless tobacco: Never Used  Substance Use Topics  . Alcohol use: Yes  . Drug use: No   Review of Systems  Objective:   Vitals:   09/25/17 1530  BP: (!) 144/82  Pulse: 93  Resp: 14  SpO2: 96%  Weight: 139 lb 14.4 oz (63.5 kg)  Height: 5' 10.38" (1.788 m)   Body mass index is 19.86 kg/m. Wt Readings from Last 3 Encounters:  09/25/17 139 lb 14.4 oz (63.5 kg)  06/25/17 136 lb (61.7 kg)  05/30/17 123 lb 9.6 oz (56.1 kg)   Physical Exam  Constitutional: He appears well-developed and well-nourished. No distress.  HENT:  Head: Normocephalic and atraumatic.  Nose: Nose normal.  Mouth/Throat: Oropharynx is clear and moist.  Eyes: EOM are normal. No scleral icterus.  Neck: No JVD present. No thyromegaly present.  Cardiovascular: Normal rate, regular rhythm and normal heart sounds.  Pulmonary/Chest: Effort normal and breath sounds normal. No respiratory distress. He has no wheezes. He has no rales.  Tenderness along anterolateral thoracic region (site of previous surgery)  Abdominal: Soft. Bowel sounds are normal. He exhibits no distension. There is tenderness (over lower ribs, costal margin up into chest, site of previous surgey). There is no guarding.  Musculoskeletal: Normal range of motion. He exhibits no edema.  Lymphadenopathy:    He has no cervical adenopathy.  Neurological: He is alert. He displays normal reflexes.  Skin: Skin is warm and dry. No rash noted. He is not diaphoretic. No erythema. No pallor.  Aged beyond years,  solar dermatitis; several nicks and abrasions on arms; no jaundice; keratotic lesion RIGHT side posterior neck; macular light brown lesion LEFT cheek  Psychiatric: He has a normal mood and affect. His behavior is normal. Judgment and thought content normal.    Assessment/Plan:   Problem List Items Addressed This Visit      Cardiovascular and Mediastinum   Aortic atherosclerosis (HCC) (Chronic)    Check lipids        Other   Prostate cancer screening    Check PSA today      Relevant Orders   PSA   Preventative health care -  Primary    USPSTF grade A and B recommendations reviewed with patient; age-appropriate recommendations, preventive care, screening tests, etc discussed and encouraged; healthy living encouraged; see AVS for patient education given to patient       Relevant Orders   COMPLETE METABOLIC PANEL WITH GFR   Lipid panel   Hx of tobacco use, presenting hazards to health    Due for chest CT in January 2020; patient to call for orders and scheduling      Alcohol use    I am here to help; advised safe limits       Other Visit Diagnoses    Screen for colon cancer       Relevant Orders   Cologuard   Chronic right-sided thoracic back pain       Relevant Orders   Ambulatory referral to Pain Clinic   Neoplasm of uncertain behavior of skin       Relevant Orders   Ambulatory referral to Dermatology      No orders of the defined types were placed in this encounter.  Orders Placed This Encounter  Procedures  . Cologuard  . PSA  . COMPLETE METABOLIC PANEL WITH GFR  . Lipid panel  . Ambulatory referral to Pain Clinic    Referral Priority:   Routine    Referral Type:   Consultation    Referral Reason:   Specialty Services Required    Requested Specialty:   Pain Medicine    Number of Visits Requested:   1  . Ambulatory referral to Dermatology    Referral Priority:   Routine    Referral Type:   Consultation    Referral Reason:   Specialty Services Required     Requested Specialty:   Dermatology    Number of Visits Requested:   1    Follow up plan: Return in about 1 year (around 09/26/2018) for complete physical; 6 weeks for BP and check-up.  An After Visit Summary was printed and given to the patient.

## 2017-09-25 NOTE — Assessment & Plan Note (Signed)
Check lipids 

## 2017-09-25 NOTE — Assessment & Plan Note (Signed)
USPSTF grade A and B recommendations reviewed with patient; age-appropriate recommendations, preventive care, screening tests, etc discussed and encouraged; healthy living encouraged; see AVS for patient education given to patient  

## 2017-09-25 NOTE — Assessment & Plan Note (Signed)
I am here to help; advised safe limits

## 2017-09-25 NOTE — Assessment & Plan Note (Signed)
Due for chest CT in January 2020; patient to call for orders and scheduling

## 2017-09-26 LAB — COMPLETE METABOLIC PANEL WITH GFR
AG RATIO: 1.5 (calc) (ref 1.0–2.5)
ALT: 20 U/L (ref 9–46)
AST: 26 U/L (ref 10–35)
Albumin: 4.5 g/dL (ref 3.6–5.1)
Alkaline phosphatase (APISO): 75 U/L (ref 40–115)
BUN: 9 mg/dL (ref 7–25)
CO2: 25 mmol/L (ref 20–32)
CREATININE: 0.84 mg/dL (ref 0.70–1.33)
Calcium: 9.6 mg/dL (ref 8.6–10.3)
Chloride: 101 mmol/L (ref 98–110)
GFR, EST AFRICAN AMERICAN: 112 mL/min/{1.73_m2} (ref >=60)
GFR, EST NON AFRICAN AMERICAN: 97 mL/min/{1.73_m2} (ref >=60)
GLUCOSE: 95 mg/dL (ref 65–139)
Globulin: 3 g/dL (calc) (ref 1.9–3.7)
POTASSIUM: 3.9 mmol/L (ref 3.5–5.3)
Sodium: 137 mmol/L (ref 135–146)
TOTAL PROTEIN: 7.5 g/dL (ref 6.1–8.1)
Total Bilirubin: 0.6 mg/dL (ref 0.2–1.2)

## 2017-09-26 LAB — LIPID PANEL
Cholesterol: 203 mg/dL — ABNORMAL HIGH (ref <200)
HDL: 90 mg/dL (ref >40)
LDL CHOLESTEROL (CALC): 88 mg/dL
NON-HDL CHOLESTEROL (CALC): 113 mg/dL (ref <130)
TRIGLYCERIDES: 149 mg/dL (ref <150)
Total CHOL/HDL Ratio: 2.3 (calc) (ref <5.0)

## 2017-09-26 LAB — PSA: PSA: 0.3 ng/mL (ref <=4.0)

## 2017-10-30 ENCOUNTER — Ambulatory Visit: Payer: 59

## 2018-04-24 ENCOUNTER — Telehealth: Payer: Self-pay | Admitting: Family Medicine

## 2018-04-24 NOTE — Telephone Encounter (Signed)
Patient has care gap for colon cancer screening and an unresolved Cologuard order Please contact patient, urge them to complete the Cologuard kit; offer to re-order if needed Thank you

## 2018-04-24 NOTE — Telephone Encounter (Signed)
Patient notified to do

## 2019-10-07 IMAGING — CR DG CHEST 2V
2 series · 2 of 2 positions shown · non-contrast
Comparison: Report of two-view chest x-ray from [REDACTED]
on 02/24/2017 describing a right lower lobe pneumonia.

CLINICAL DATA: 56-year-old recently diagnosed with right lower lobe
pneumonia on 02/24/2017, presenting with worsening shortness of
breath and generalized weakness despite treatment. Current smoker.

EXAM:
CHEST  2 VIEW

[chest pa]
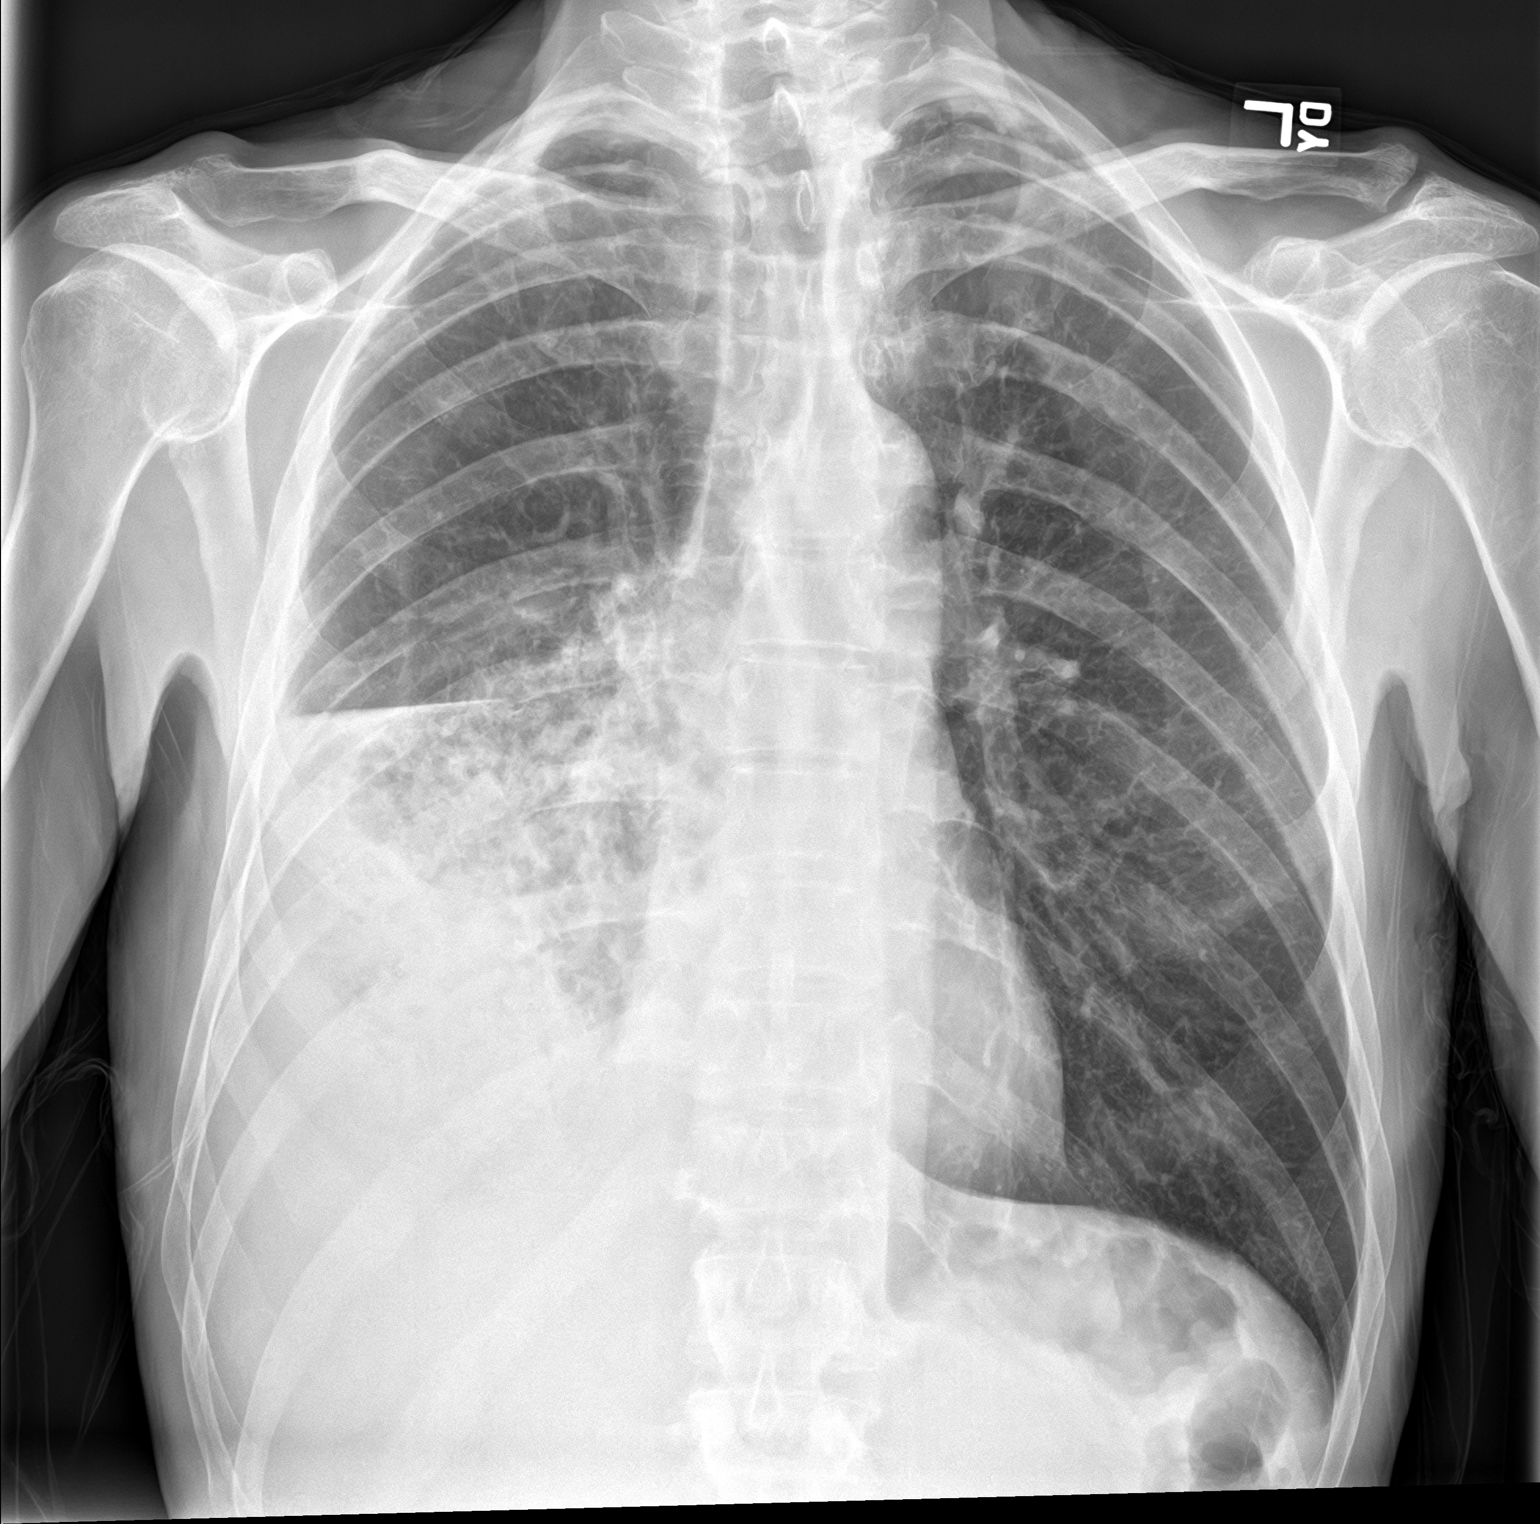

[chest lat]
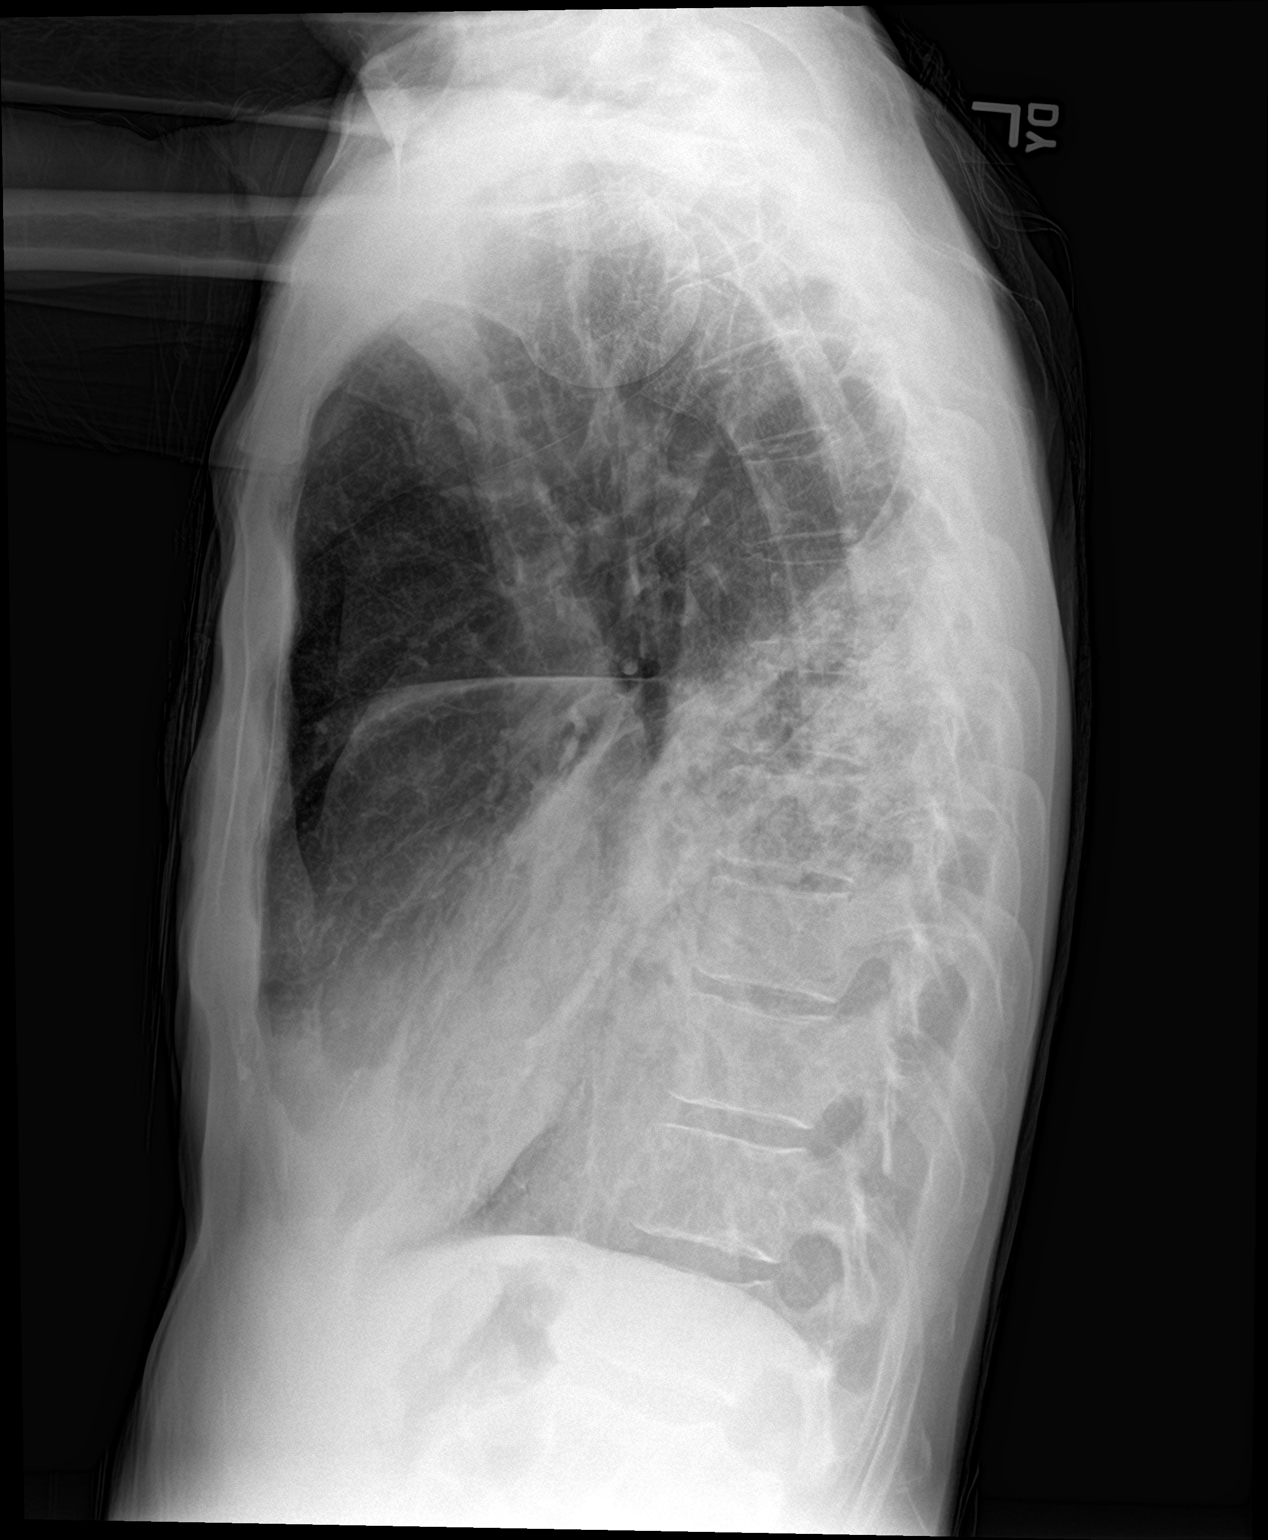

[2 of 2 positions shown; findings below may reference images not displayed]

FINDINGS: Cardiomediastinal silhouette unremarkable. Extensive airspace
consolidation throughout the right lower lobe, associated with a
moderate-sized right pleural effusion. No airspace consolidation
elsewhere in either lung. Emphysematous changes throughout both
lungs. No left pleural effusion. Visualized bony thorax intact.
IMPRESSION: 1. Pneumonia involving the entire right lower lobe associated with a
moderate-sized right parapneumonic effusion.
2.  Emphysema. ([id number])

## 2019-10-10 IMAGING — CR DG CHEST 2V
1 series · 2 of 2 positions shown · non-contrast
Comparison: CT 03/02/2017

CLINICAL DATA: Pneumonia

EXAM:
CHEST  2 VIEW

[Series 1: dg chest 2 view · 0.14mm/px · 2 of 2 slices shown]
[im 1/2]
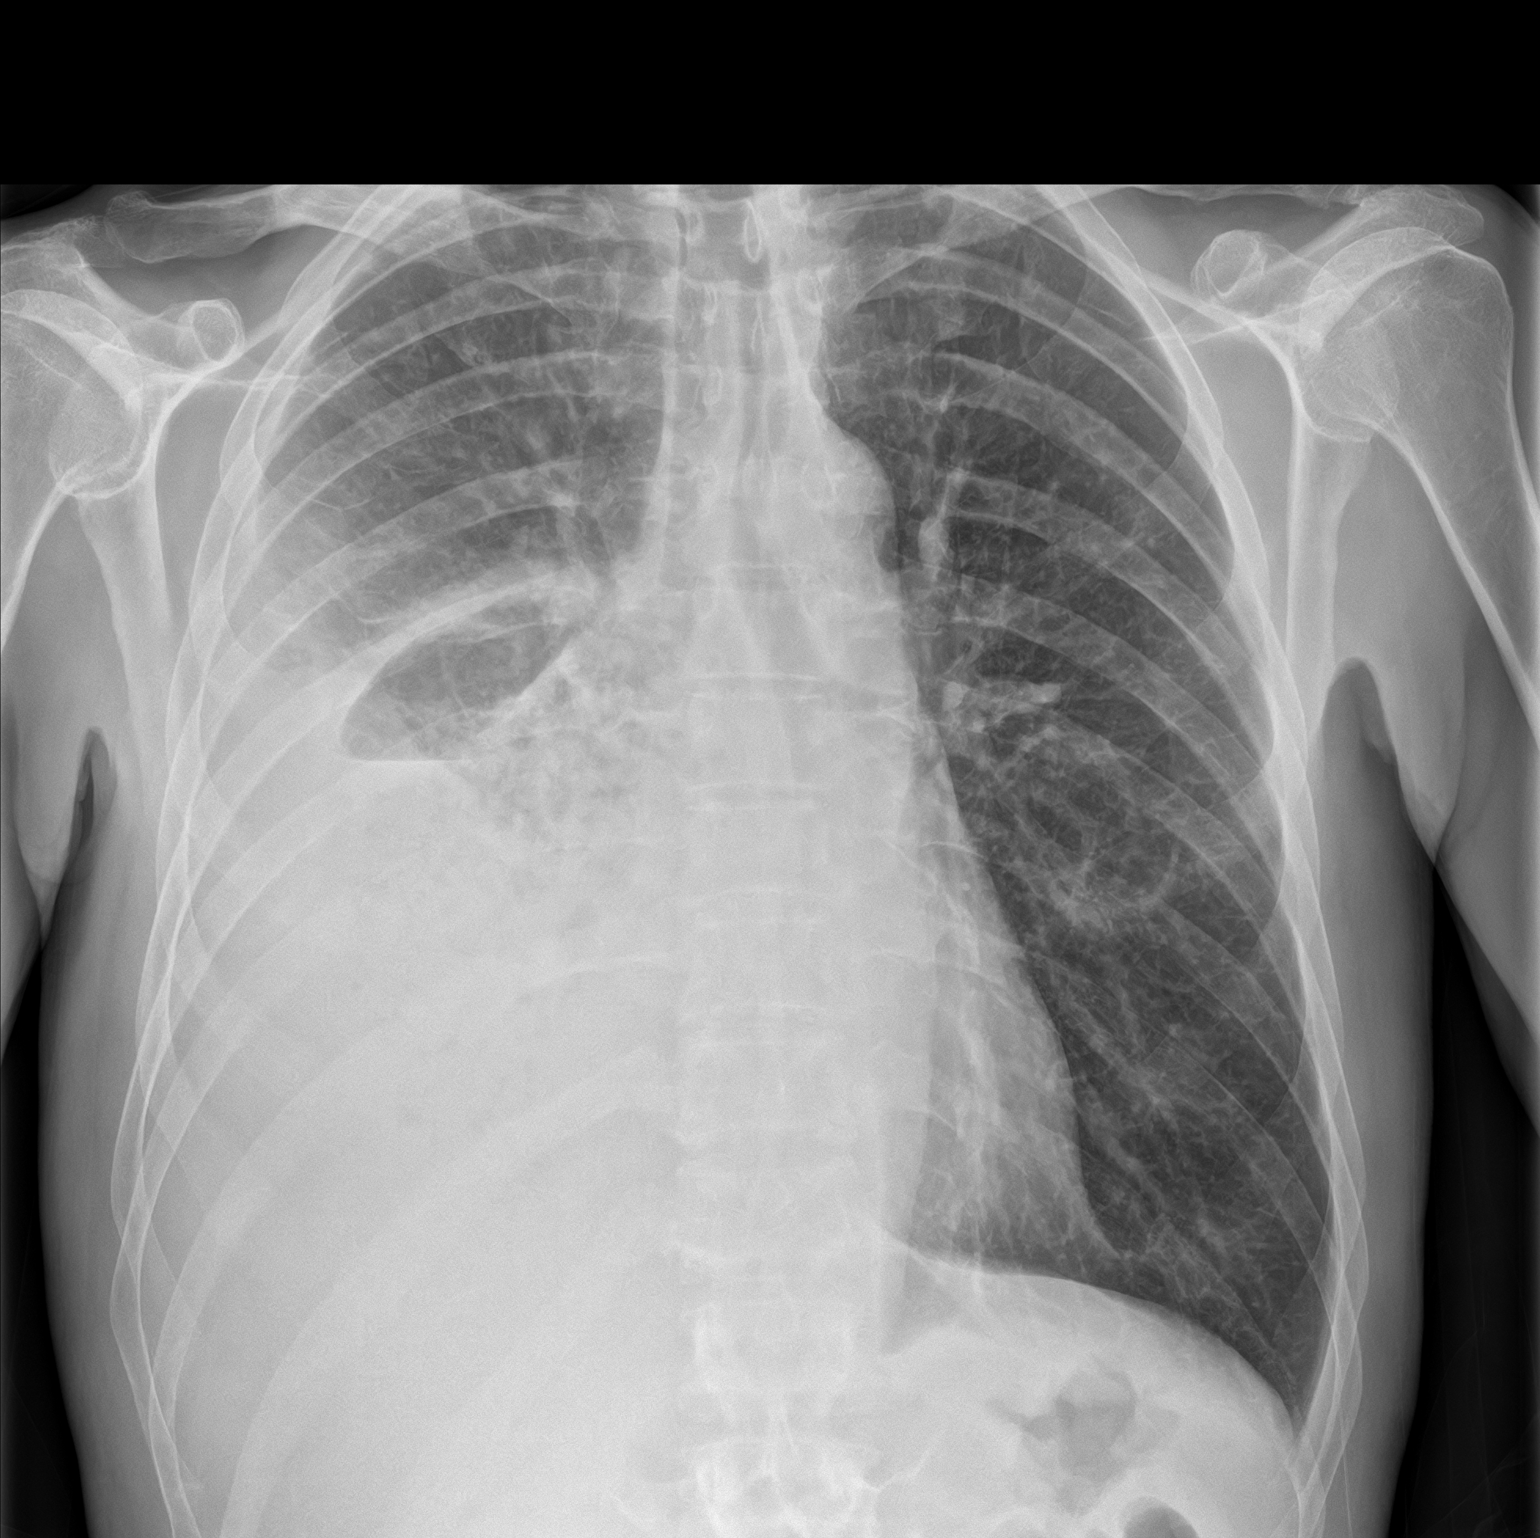
[im 2/2]
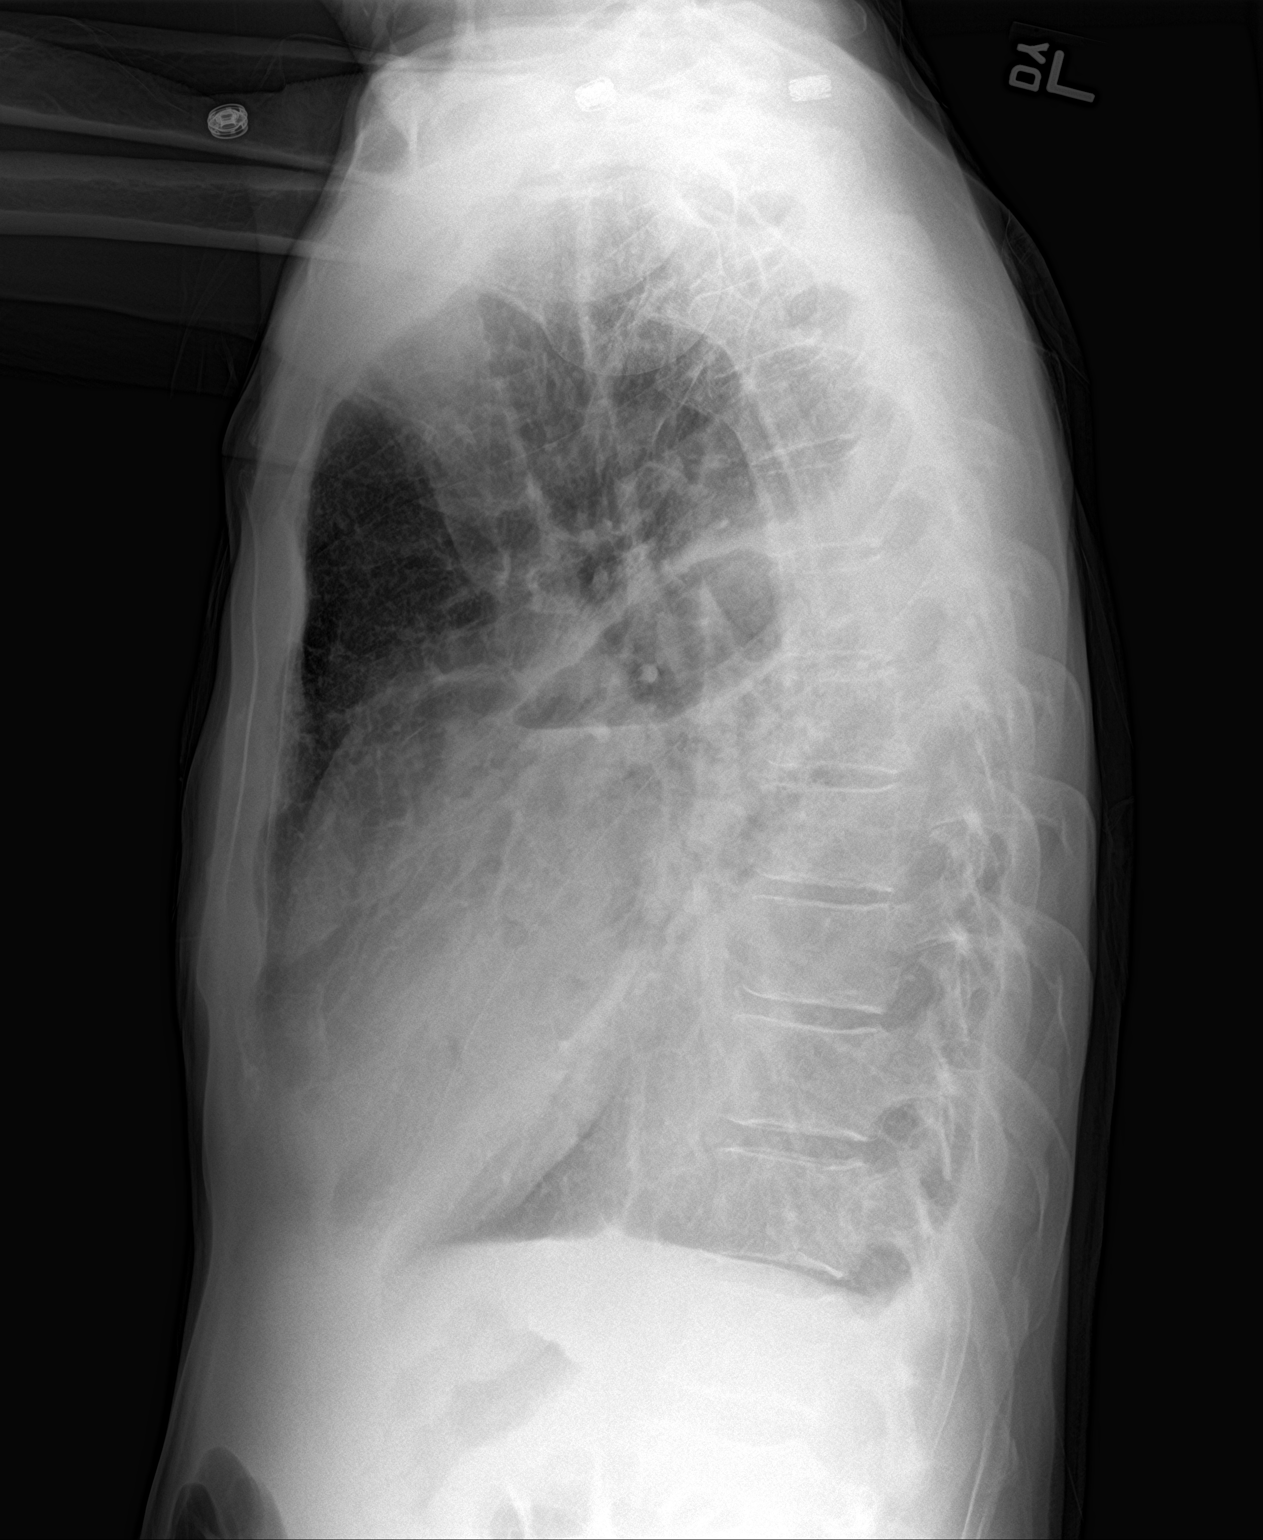

[2 of 2 positions shown; findings below may reference images not displayed]

FINDINGS: Dense consolidation in the right lower lobe. Air-fluid level noted,
similar to prior study compatible with necrotic pneumonia.
Associated right pleural effusion. Left lung is clear. Heart is
normal size.
IMPRESSION: Continued dense consolidation in the right lower lobe. Air-fluid
level noted in the superior right lower lobe in an area of necrotic
or cystic change seen on prior CT.

Associated right pleural effusion.

## 2019-11-06 IMAGING — DX DG CHEST 1V PORT
1 series · 2 of 2 positions shown · non-contrast
Comparison: Chest x-ray 03/30/2017.

CLINICAL DATA: 57-year-old male with history of pneumonia and
empyema. Follow-up study.

EXAM:
PORTABLE CHEST 1 VIEW

[Series 1: chest ap · 0.14mm/px · 2 of 2 slices shown]
[im 1/2]
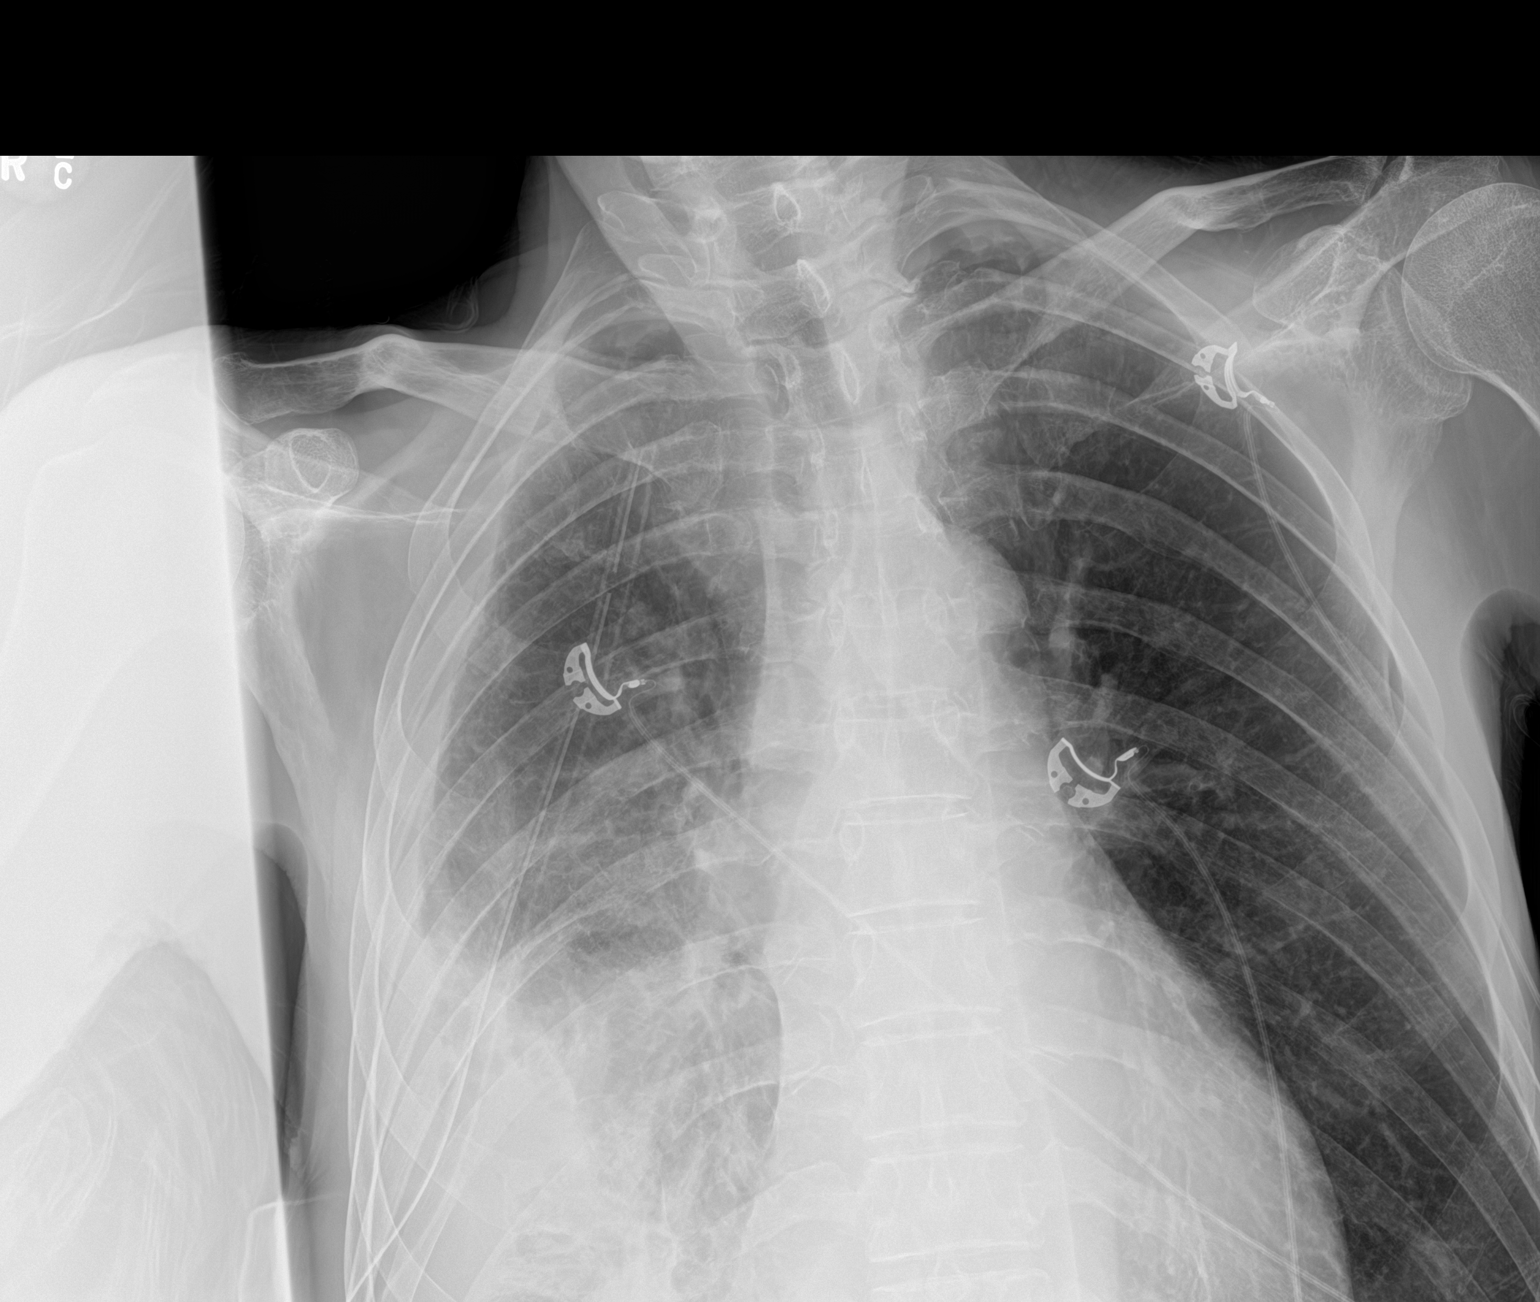
[im 2/2]
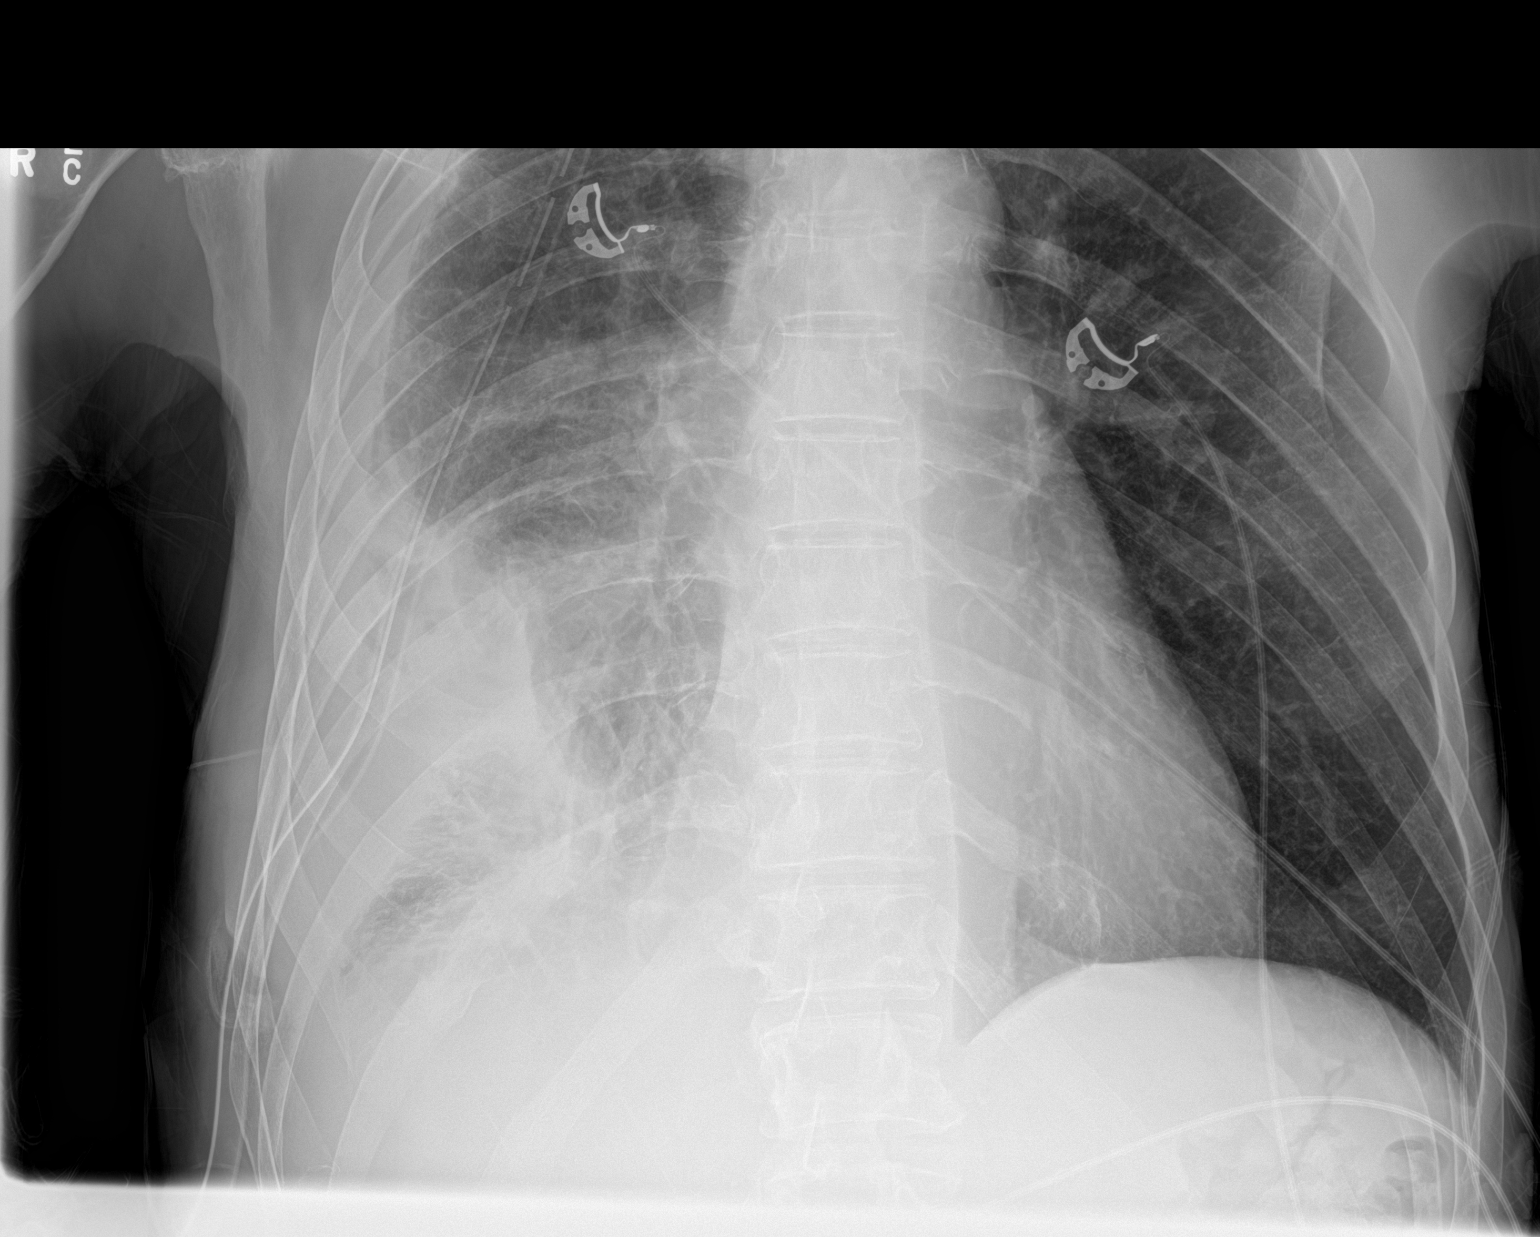

[2 of 2 positions shown; findings below may reference images not displayed]

FINDINGS: Previously noted right-sided chest tube remains stable in position
with tip near the apex of the right hemithorax. Complex right
pleural fluid and gas collection (empyema) appears slightly
decreased in size compared to the prior study. Continued improved
aeration throughout the right lung compatible with resolving areas
of atelectasis and consolidation. Left lung remains clear. No left
pleural effusion. No evidence of pulmonary edema. Heart size is
normal. Upper mediastinal contours are within normal limits.
IMPRESSION: 1. Decreasing size of right-sided empyema with continued
re-expansion of the right lung and resolving areas of atelectasis
and consolidation, as above.

## 2019-11-07 IMAGING — DX DG CHEST 1V PORT
1 series · 2 of 2 positions shown · non-contrast
Comparison: Chest x-ray of March 31, 2017

CLINICAL DATA: Follow-up right chest tube positioning

EXAM:
PORTABLE CHEST 1 VIEW

[Series 1: chest ap · 0.14mm/px · 2 of 2 slices shown]
[im 1/2]
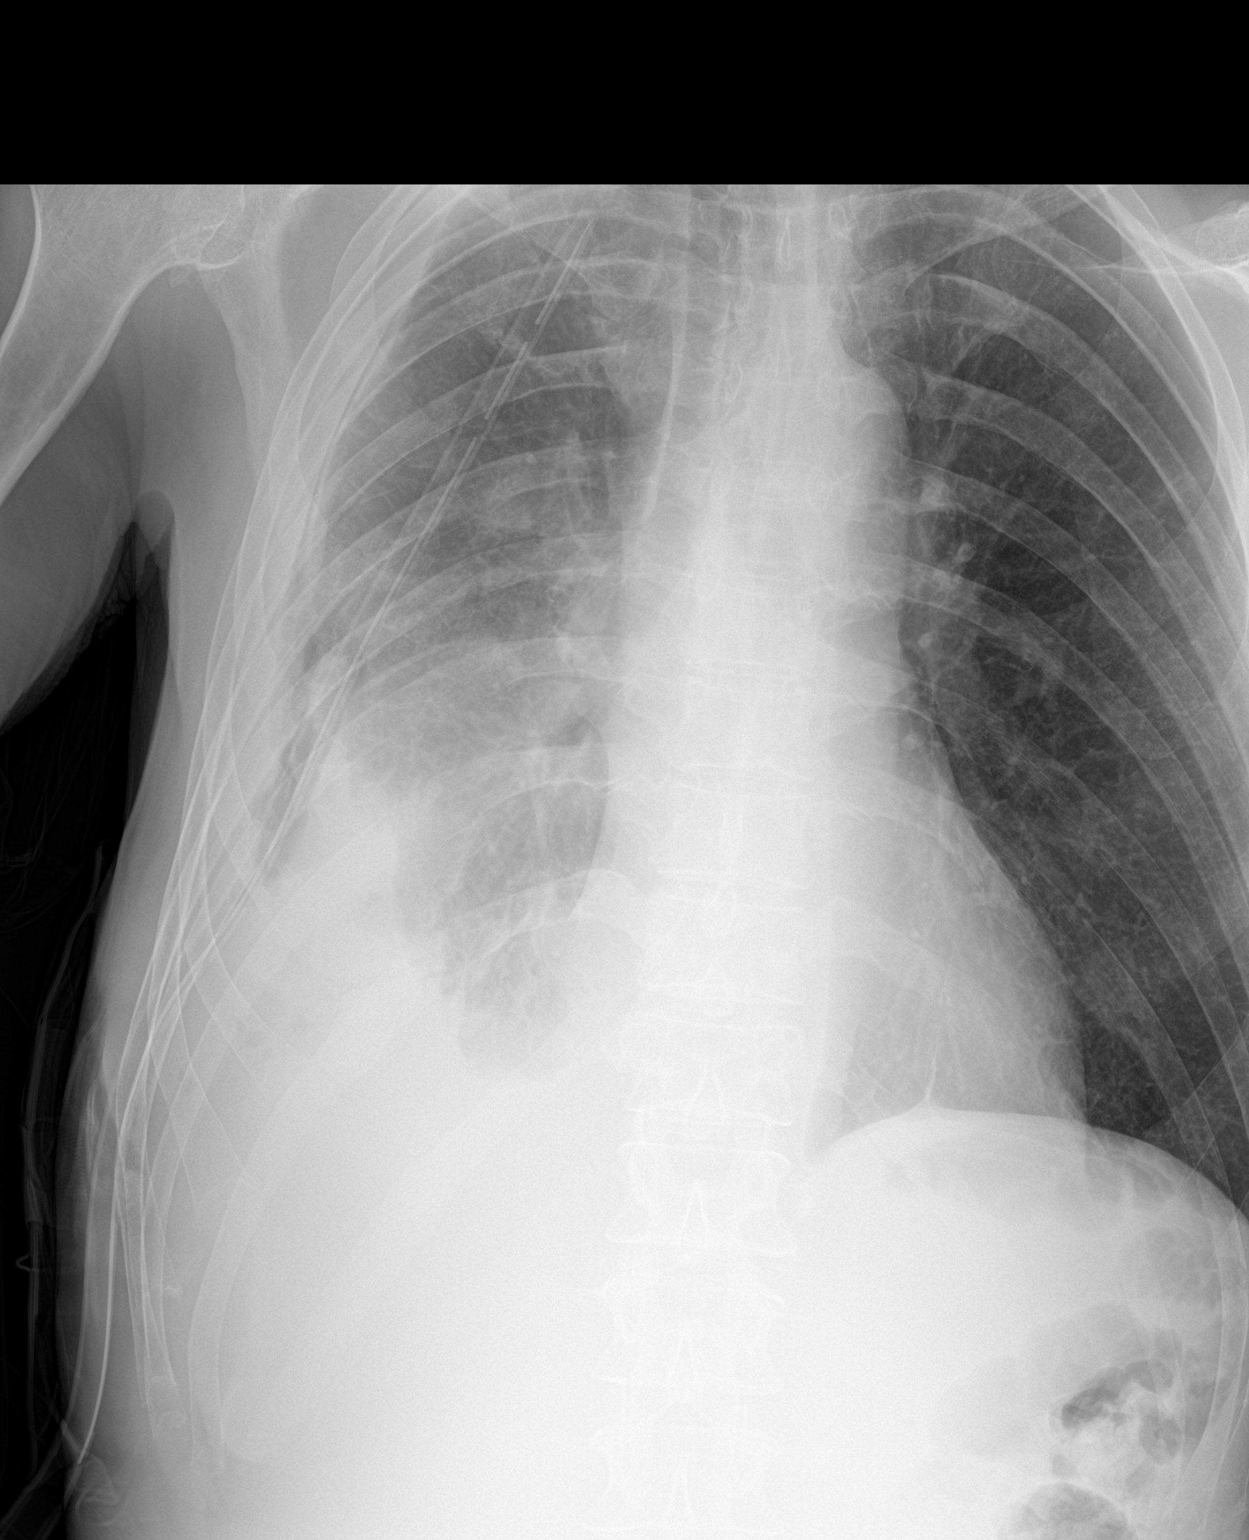
[im 2/2]
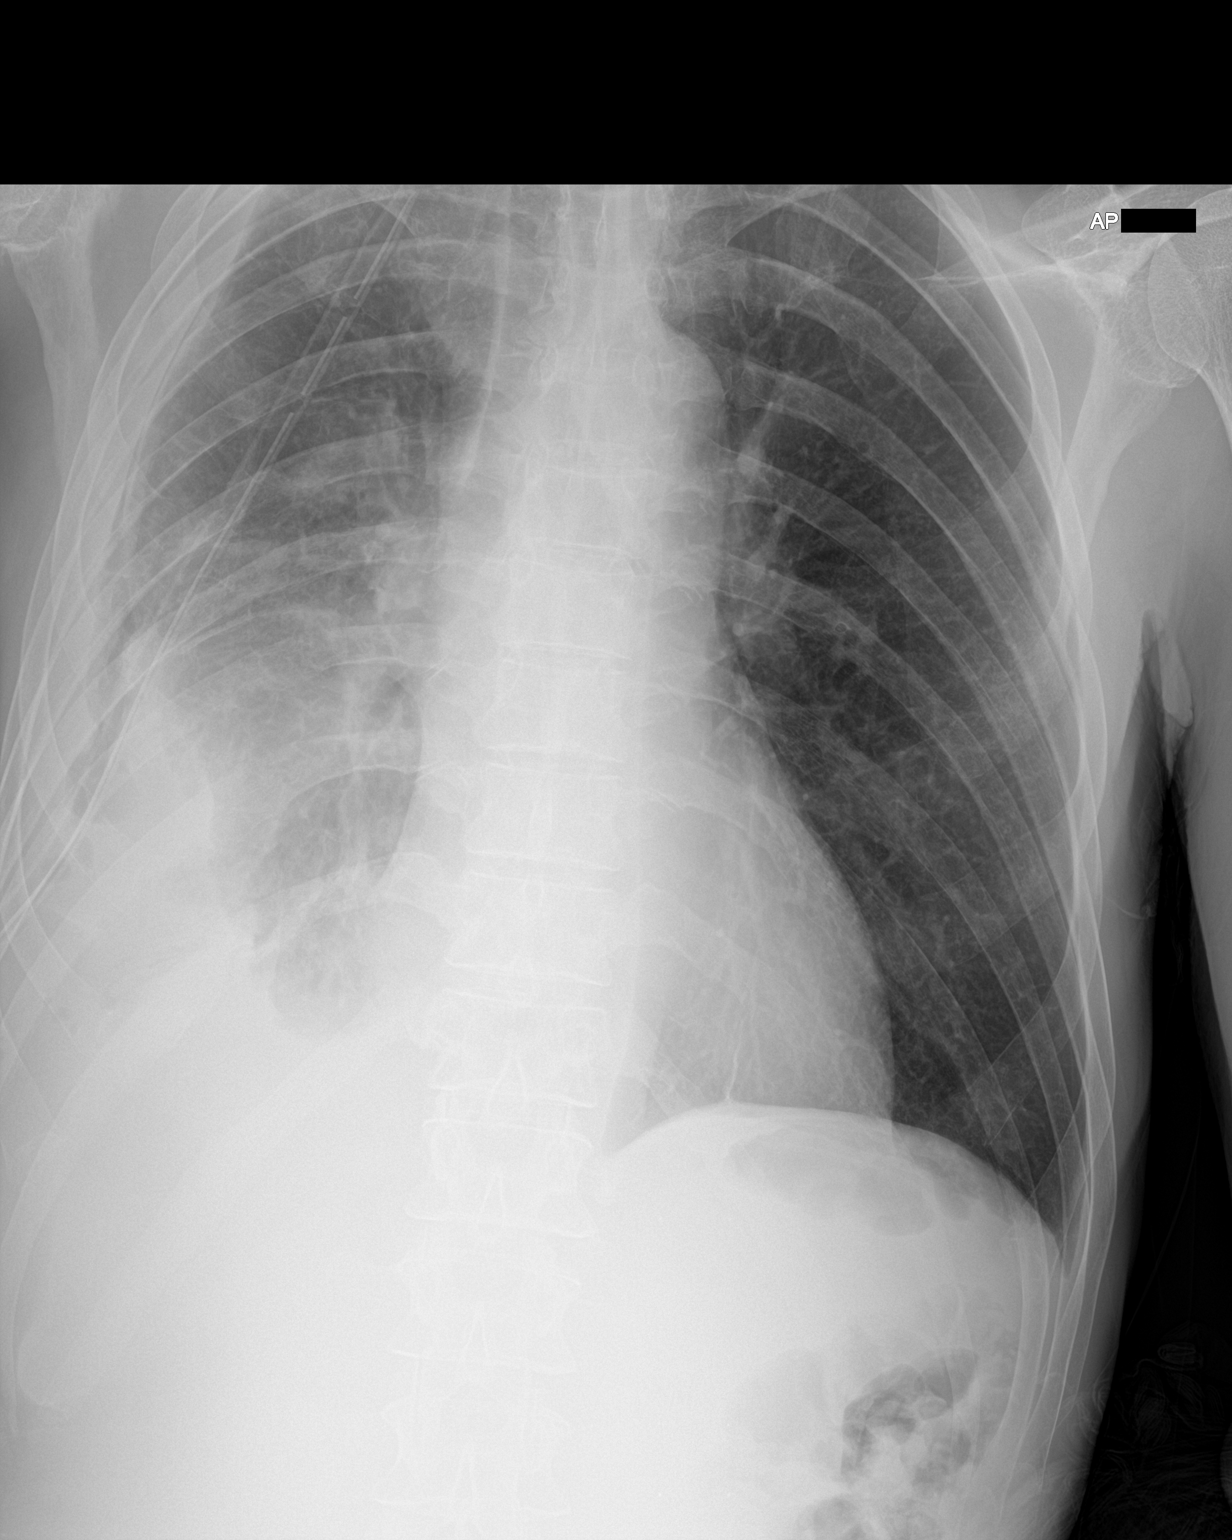

[2 of 2 positions shown; findings below may reference images not displayed]

FINDINGS: The right chest tube tip projects over the posteromedial aspect of
the fourth rib. There remains a small to moderate-sized right
pleural effusion. There is no pneumothorax. There is right basilar
parenchymal density. The mediastinum is not shifted. The left lung
is well-expanded and clear. There is stable left apical pleural
thickening. The heart and pulmonary vascularity are normal. The
mediastinum is normal in width. The observed bony thorax is
unremarkable.
IMPRESSION: Stable appearance of the moderate-sized right pleural
effusion/empyema. Persistent right basilar atelectasis or pneumonia.
No pneumothorax. Stable positioning of the right-sided chest tube.

## 2019-11-30 NOTE — Progress Notes (Addendum)
Name: Kristopher [name]   MRN: [id number]    DOB: 18-May-1960   Date:12/01/2019       Progress Note  Subjective  Chief Complaint  Chief Complaint  Patient presents with  . Dyslipidemia  . Medication Refill  . Breathing Problem    He has sob when it is humid outside.  . Fatigue    HPI  Atherosclerosis of Aorta: he never filled rx of Atorvastatin given by Dr. Sanda Klein, previous PCP, back in 2019. Reviewed CT chest done and results including 3 vessel heart disease. We will recheck labs and start today on Atorvastatin 20 mg daily   Chronic bronchitis: he is a smoker, he has a history of empyema and chest tubes with scarring back in 2019 . He states he still smokes about half pack daily, has daily productive cough, yellow/green and some sob with activity. Denies wheezing , but states humid and hot days makes symptoms much worse . Discussed starting on inhaler and he is willing to try it. Also discussed tobacco cessation. 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 He denies chest pain, palpitation , he never took bp medication in the past. We will try ARB and adjust dose as tolerated  Left arm paresthesia: he denies neck pain, no chest pain, explained we will discuss it further on his follow up since he is new to me and multiple issues going on  Paroxysmal afib: during hospital stay for empyema, his CHA2DS2.VAS today is considered medium risk , not having symptoms of afib, we will start aspirin for now   CHA2DS2/VAS Stroke Risk Points  Current as of 7 minutes ago     1 >= 2 Points: High Risk  1 - 1.99 Points: Medium Risk  0 Points: Low Risk    No Change      Details    This score determines the  patient's risk of having a stroke if the  patient has atrial fibrillation.       Points Metrics  0 Has Congestive Heart Failure:  No    Current as of 7 minutes ago  0 Has Vascular Disease:  No    Current as of 7 minutes ago  1 Has Hypertension:  Yes    Current as of 7 minutes ago  0 Age:  59    Current as of 7 minutes ago  0 Has Diabetes:  No    Current as of 7 minutes ago  0 Had Stroke:  No  Had TIA:  No  Had thromboembolism:  No    Current as of 7 minutes ago  0 Male:  No    Current as of 7 minutes ago            Patient Active Problem List   Diagnosis Date Noted  . Preventative health care 09/25/2017  . Prostate cancer screening 09/25/2017  . Encounter for hepatitis C screening test for low risk patient 09/09/2017  . Vitamin B12 deficiency 06/28/2017  . Dyslipidemia 06/28/2017  . Hx of pleural empyema 06/25/2017  . Hx of tobacco use, presenting hazards to health 06/25/2017  . Aortic atherosclerosis ([place]) 06/25/2017  .  Alcohol use 06/25/2017  . Abnormal thyroid exam 06/25/2017  . Protein-calorie malnutrition, severe 03/03/2017    Past Surgical History:  Procedure Laterality Date  . DECORTICATION N/A 04/03/2017   Procedure: DECORTICATION;  Surgeon: Nestor Lewandowsky, MD;  Location: [facility] ORS;  Service: Thoracic;  Laterality: N/A;  debridement of lung abscess  . SKIN GRAFT    . THORACOTOMY Right 04/03/2017   Procedure: THORACOTOMY MAJOR;  Surgeon: Nestor Lewandowsky, MD;  Location: [facility] ORS;  Service: Thoracic;  Laterality: Right;  Marland Kitchen VIDEO BRONCHOSCOPY N/A 04/03/2017   Procedure: PREOP BRONCHOSCOPY;  Surgeon: Nestor Lewandowsky, MD;  Location: [facility] ORS;  Service: Thoracic;  Laterality: N/A;    Family History  Problem Relation Age of Onset  . Atrial fibrillation Mother   . Dementia Mother     Social History   Tobacco Use  . Smoking status: Former Smoker    Packs/day: 1.50    Years: 35.00    Pack years: 52.50    Types: Cigarettes    Quit date: 02/25/2017    Years since  quitting: 2.7  . Smokeless tobacco: Never Used  Substance Use Topics  . Alcohol use: Yes     Current Outpatient Medications:  Marland Kitchen  Multiple Vitamins-Minerals (MULTIVITAMIN ADULT PO), Take by mouth daily., Disp: , Rfl:  .  aspirin EC 81 MG tablet, Take 1 tablet (81 mg total) by mouth daily. (Patient not taking: Reported on 12/01/2019), Disp: , Rfl:  .  atorvastatin (LIPITOR) 10 MG tablet, Take 1 tablet (10 mg total) by mouth at bedtime. (Patient not taking: Reported on 12/01/2019), Disp: 30 tablet, Rfl: 1  No Known Allergies  I personally reviewed active problem list, medication list, allergies, family history, social history, health maintenance with the patient/caregiver today.   ROS  Constitutional: Negative for fever or weight change.  Respiratory: positive  for cough and intermittent  shortness of breath.   Cardiovascular: Negative for chest pain or palpitations.  Gastrointestinal: Negative for abdominal pain, no bowel changes.  Musculoskeletal: Negative for gait problem or joint swelling.  Skin: Negative for rash.  Neurological: Negative for dizziness or headache.  No other specific complaints in a complete review of systems (except as listed in HPI above).  Objective  Vitals:   12/01/19 1204  BP: (!) 150/88  Pulse: 95  Resp: 16  Temp: 98.5 F (36.9 C)  TempSrc: Oral  SpO2: 99%  Weight: 131 lb (59.4 kg)  Height: 5\' 10"  (1.778 m)    Body mass index is 18.8 kg/m.  Physical Exam  Constitutional: Patient appears well-developed and very thin No distress.  HEENT: head atraumatic, normocephalic, pupils equal and reactive to light, neck supple Cardiovascular: Normal rate, regular rhythm and normal heart sounds.  No murmur heard. No BLE edema. Pulmonary/Chest: Effort normal and breath sounds normal. No respiratory distress. Skin: senile purpura both arms Abdominal: Soft.  There is no tenderness. Psychiatric: Patient has a normal mood and affect. behavior is normal.  Judgment and thought content normal.  PHQ2/9: Depression screen [facility] 2/9 12/01/2019 09/25/2017 06/25/2017  Decreased Interest 0 0 0  Down, Depressed, Hopeless 0 3 0  PHQ - 2 Score 0 3 0  Altered sleeping - 0 -  Tired, decreased energy - 3 -  Change in appetite - 1 -  Feeling bad or failure about yourself  - 0 -  Trouble concentrating - 0 -  Moving slowly or fidgety/restless - 0 -  Suicidal thoughts - 0 -  PHQ-9 Score - 7 -  Difficult doing  work/chores - Not difficult at all -    phq 9 is negative  Fall Risk: Fall Risk  12/01/2019 09/25/2017 06/25/2017  Falls in the past year? 0 Yes No  Number falls in past yr: 0 1 -  Injury with Fall? 0 No -     Functional Status Survey: Is the patient deaf or have difficulty hearing?: No Does the patient have difficulty seeing, even when wearing glasses/contacts?: Yes Does the patient have difficulty concentrating, remembering, or making decisions?: No Does the patient have difficulty walking or climbing stairs?: No Does the patient have difficulty dressing or bathing?: No Does the patient have difficulty doing errands alone such as visiting a doctor's office or shopping?: No    Assessment & Plan  1. Dyslipidemia  - atorvastatin (LIPITOR) 20 MG tablet; Take 1 tablet (20 mg total) by mouth at bedtime.  Dispense: 90 tablet; Refill: 1  2. Screening for colon cancer  - Cologuard  3. Aortic atherosclerosis (HCC)  - Lipid panel  4. Hypertension, benign  - TSH - CBC with Differential/Platelet - COMPLETE METABOLIC PANEL WITH GFR - valsartan (DIOVAN) 160 MG tablet; Take 1 tablet (160 mg total) by mouth daily.  Dispense: 30 tablet; Refill: 2  5. Senile purpura ([place])   6. Arm paresthesia, left   7. Tobacco use  Discussed importance of quitting   8. Mucopurulent chronic bronchitis ([place])  - umeclidinium-vilanterol (ANORO ELLIPTA) 62.5-25 MCG/INH AEPB; Inhale 1 puff into the lungs daily.  Dispense: 60 each; Refill: 5 - albuterol  (VENTOLIN HFA) 108 (90 Base) MCG/ACT inhaler; Inhale 2 puffs into the lungs every 6 (six) hours as needed for wheezing or shortness of breath.  Dispense: 8 g; Refill: 0  9. Other fatigue  Check labs  10. Paroxysmal atrial fibrillation (HCC)   11. Hyperglycemia  - Hemoglobin A1c  12. Need for immunization against influenza  - Flu Vaccine QUAD 36+ mos IM

## 2019-12-01 ENCOUNTER — Encounter: Payer: Self-pay | Admitting: Family Medicine

## 2019-12-01 ENCOUNTER — Other Ambulatory Visit: Payer: Self-pay

## 2019-12-01 ENCOUNTER — Ambulatory Visit (INDEPENDENT_AMBULATORY_CARE_PROVIDER_SITE_OTHER): Payer: Self-pay | Admitting: Family Medicine

## 2019-12-01 VITALS — BP 150/88 | HR 95 | Temp 98.5°F | Resp 16 | Ht 70.0 in | Wt 131.0 lb

## 2019-12-01 DIAGNOSIS — I10 Essential (primary) hypertension: Secondary | ICD-10-CM

## 2019-12-01 DIAGNOSIS — R739 Hyperglycemia, unspecified: Secondary | ICD-10-CM

## 2019-12-01 DIAGNOSIS — I700 Atherosclerosis of aorta: Secondary | ICD-10-CM

## 2019-12-01 DIAGNOSIS — Z1211 Encounter for screening for malignant neoplasm of colon: Secondary | ICD-10-CM

## 2019-12-01 DIAGNOSIS — I480 Paroxysmal atrial fibrillation: Secondary | ICD-10-CM

## 2019-12-01 DIAGNOSIS — R202 Paresthesia of skin: Secondary | ICD-10-CM

## 2019-12-01 DIAGNOSIS — J411 Mucopurulent chronic bronchitis: Secondary | ICD-10-CM

## 2019-12-01 DIAGNOSIS — R5383 Other fatigue: Secondary | ICD-10-CM

## 2019-12-01 DIAGNOSIS — E785 Hyperlipidemia, unspecified: Secondary | ICD-10-CM

## 2019-12-01 DIAGNOSIS — Z23 Encounter for immunization: Secondary | ICD-10-CM

## 2019-12-01 DIAGNOSIS — D692 Other nonthrombocytopenic purpura: Secondary | ICD-10-CM

## 2019-12-01 DIAGNOSIS — Z720 Tobacco use: Secondary | ICD-10-CM

## 2019-12-01 MED ORDER — ALBUTEROL SULFATE HFA 108 (90 BASE) MCG/ACT IN AERS: 2.0000 | INHALATION_SPRAY | Freq: Four times a day (QID) | RESPIRATORY_TRACT | 0 refills | Status: DC | PRN

## 2019-12-01 MED ORDER — ATORVASTATIN CALCIUM 20 MG PO TABS: 20.0000 mg | ORAL_TABLET | Freq: Every day | ORAL | 1 refills | Status: DC

## 2019-12-01 MED ORDER — ANORO ELLIPTA 62.5-25 MCG/INH IN AEPB: 1.0000 | INHALATION_SPRAY | Freq: Every day | RESPIRATORY_TRACT | 5 refills | Status: DC

## 2019-12-01 MED ORDER — VALSARTAN 160 MG PO TABS: 160.0000 mg | ORAL_TABLET | Freq: Every day | ORAL | 2 refills | Status: DC

## 2019-12-02 LAB — COMPLETE METABOLIC PANEL WITH GFR
AG Ratio: 1.7 (calc) (ref 1.0–2.5)
ALT: 18 U/L (ref 9–46)
AST: 34 U/L (ref 10–35)
Albumin: 4.8 g/dL (ref 3.6–5.1)
Alkaline phosphatase (APISO): 75 U/L (ref 35–144)
BUN/Creatinine Ratio: 10 (calc) (ref 6–22)
BUN: 7 mg/dL (ref 7–25)
CO2: 28 mmol/L (ref 20–32)
Calcium: 10.2 mg/dL (ref 8.6–10.3)
Chloride: 97 mmol/L — ABNORMAL LOW (ref 98–110)
Creat: 0.67 mg/dL — ABNORMAL LOW (ref 0.70–1.25)
GFR, Est African American: 121 mL/min/{1.73_m2} (ref >=60)
GFR, Est Non African American: 104 mL/min/{1.73_m2} (ref >=60)
Globulin: 2.8 g/dL (calc) (ref 1.9–3.7)
Glucose, Bld: 87 mg/dL (ref 65–99)
Potassium: 5.1 mmol/L (ref 3.5–5.3)
Sodium: 134 mmol/L — ABNORMAL LOW (ref 135–146)
Total Bilirubin: 0.7 mg/dL (ref 0.2–1.2)
Total Protein: 7.6 g/dL (ref 6.1–8.1)

## 2019-12-02 LAB — CBC WITH DIFFERENTIAL/PLATELET
Absolute Monocytes: 766 cells/uL (ref 200–950)
Basophils Absolute: 59 cells/uL (ref 0–200)
Basophils Relative: 0.9 %
Eosinophils Absolute: 99 cells/uL (ref 15–500)
Eosinophils Relative: 1.5 %
HCT: 47.9 % (ref 38.5–50.0)
Hemoglobin: 16.5 g/dL (ref 13.2–17.1)
Lymphs Abs: 1129 cells/uL (ref 850–3900)
MCH: 33.7 pg — ABNORMAL HIGH (ref 27.0–33.0)
MCHC: 34.4 g/dL (ref 32.0–36.0)
MCV: 98 fL (ref 80.0–100.0)
MPV: 9.4 fL (ref 7.5–12.5)
Monocytes Relative: 11.6 %
Neutro Abs: 4547 cells/uL (ref 1500–7800)
Neutrophils Relative %: 68.9 %
Platelets: 276 10*3/uL (ref 140–400)
RBC: 4.89 10*6/uL (ref 4.20–5.80)
RDW: 11.6 % (ref 11.0–15.0)
Total Lymphocyte: 17.1 %
WBC: 6.6 10*3/uL (ref 3.8–10.8)

## 2019-12-02 LAB — TSH: TSH: 1.87 mIU/L (ref 0.40–4.50)

## 2019-12-02 LAB — LIPID PANEL
Cholesterol: 195 mg/dL (ref <200)
HDL: 104 mg/dL (ref >=40)
LDL Cholesterol (Calc): 70 mg/dL (calc)
Non-HDL Cholesterol (Calc): 91 mg/dL (calc) (ref <130)
Total CHOL/HDL Ratio: 1.9 (calc) (ref <5.0)
Triglycerides: 120 mg/dL (ref <150)

## 2019-12-02 LAB — HEMOGLOBIN A1C
Hgb A1c MFr Bld: 5.1 % of total Hgb (ref <5.7)
Mean Plasma Glucose: 100 (calc)
eAG (mmol/L): 5.5 (calc)

## 2019-12-03 ENCOUNTER — Other Ambulatory Visit: Payer: Self-pay

## 2019-12-03 ENCOUNTER — Telehealth: Payer: Self-pay | Admitting: Family Medicine

## 2019-12-03 DIAGNOSIS — J411 Mucopurulent chronic bronchitis: Secondary | ICD-10-CM

## 2019-12-03 NOTE — Telephone Encounter (Signed)
Pts daughter called stating that the pt could not pick up his Anoro inhaler due to the price ($450). She is requesting to have something else sent in for him. Please advise.       [id number] [address], [place] AT [facility] OF [address]  [place] [address]  Phone: [phone] Fax: [phone]  Hours: Not open 24 hours

## 2020-03-29 ENCOUNTER — Ambulatory Visit: Payer: 59 | Admitting: Family Medicine

## 2020-03-29 NOTE — Progress Notes (Deleted)
Name: Kristopher [name]   MRN: [id number]    DOB: [birth year]   Date:03/29/2020       Progress Note  Subjective  Chief Complaint  Follow Up  HPI  Atherosclerosis of Aorta: he never filled rx of Atorvastatin given by Dr. Sanda Klein, previous PCP, back in 2019. Reviewed CT chest done and results including 3 vessel heart disease. We will recheck labs and start today on Atorvastatin 20 mg daily   Chronic bronchitis: he is a smoker, he has a history of empyema and chest tubes with scarring back in 2019 . He states he still smokes about half pack daily, has daily productive cough, yellow/green and some sob with activity. Denies wheezing , but states humid and hot days makes symptoms much worse . Discussed starting on inhaler and he is willing to try it. Also discussed tobacco cessation. 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 He denies chest pain, palpitation , he never took bp medication in the past. We will try ARB and adjust dose as tolerated  Left arm paresthesia: he denies neck pain, no chest pain, explained we will discuss it further on his follow up since he is new to me and multiple issues going on  Paroxysmal afib: during hospital stay for empyema, his CHA2DS2.VAS today is considered medium risk , not having symptoms of afib, we will start aspirin for now   CHA2DS2/VAS Stroke Risk Points  Current as of 7 minutes ago     1 >= 2 Points: High Risk  1 - 1.99 Points: Medium Risk  0 Points: Low Risk    No Change      Details    This score determines the patient's risk of having a stroke if the  patient has atrial fibrillation.       Points Metrics  0 Has Congestive Heart Failure:  No    Current as of  7 minutes ago  0 Has Vascular Disease:  No    Current as of 7 minutes ago  1 Has Hypertension:  Yes    Current as of 7 minutes ago  0 Age:  60    Current as of 7 minutes ago  0 Has Diabetes:  No    Current as of 7 minutes ago  0 Had Stroke:  No  Had TIA:  No  Had thromboembolism:  No    Current as of 7 minutes ago  0 Male:  No    Current as of 7 minutes ago     Patient Active Problem List   Diagnosis Date Noted  . Paroxysmal atrial fibrillation ([place]) 12/01/2019  . Prostate cancer screening 09/25/2017  . Vitamin B12 deficiency 06/28/2017  . Dyslipidemia 06/28/2017  . Hx of pleural empyema 06/25/2017  . Hx of tobacco use, presenting hazards to health 06/25/2017  . Aortic atherosclerosis ([place]) 06/25/2017  . Alcohol use 06/25/2017  . Abnormal thyroid exam 06/25/2017    Past Surgical History:  Procedure Laterality Date  . DECORTICATION N/A 04/03/2017   Procedure: DECORTICATION;  Surgeon: Nestor Lewandowsky, MD;  Location: [facility] ORS;  Service: Thoracic;  Laterality: N/A;  debridement of lung abscess  .  SKIN GRAFT    . THORACOTOMY Right 04/03/2017   Procedure: THORACOTOMY MAJOR;  Surgeon: Nestor Lewandowsky, MD;  Location: [facility] ORS;  Service: Thoracic;  Laterality: Right;  Marland Kitchen VIDEO BRONCHOSCOPY N/A 04/03/2017   Procedure: PREOP BRONCHOSCOPY;  Surgeon: Nestor Lewandowsky, MD;  Location: [facility] OR[facility];  Service: Thoracic;  Laterality: N/A;    Family History  Problem Relation Age of Onset  . Atrial fibrillation Mother   . Dementia Mother     Social History   Tobacco Use  . Smoking status: Former Smoker    Packs/day: 1.50    Years: 35.00    Pack years: 52.50    Types: Cigarettes    Quit date: 02/25/2017    Years since quitting: 3.0  . Smokeless tobacco: Never Used  Substance Use Topics  . Alcohol use: Yes     Current Outpatient Medications:  .  albuterol (VENTOLIN HFA) 108 (90 Base) MCG/ACT inhaler, Inhale 2 puffs into the lungs every 6 (six) hours as needed for wheezing or shortness of  breath., Disp: 8 g, Rfl: 0 .  aspirin EC 81 MG tablet, Take 1 tablet (81 mg total) by mouth daily. (Patient not taking: Reported on 12/01/2019), Disp: , Rfl:  .  atorvastatin (LIPITOR) 20 MG tablet, Take 1 tablet (20 mg total) by mouth at bedtime., Disp: 90 tablet, Rfl: 1 .  Multiple Vitamins-Minerals (MULTIVITAMIN ADULT PO), Take by mouth daily., Disp: , Rfl:  .  umeclidinium-vilanterol (ANORO ELLIPTA) 62.5-25 MCG/INH AEPB, Inhale 1 puff into the lungs daily., Disp: 60 each, Rfl: 5 .  valsartan (DIOVAN) 160 MG tablet, Take 1 tablet (160 mg total) by mouth daily., Disp: 30 tablet, Rfl: 2  No Known Allergies  I personally reviewed {Reviewed:14835} with the patient/caregiver today.   ROS  ***  Objective  There were no vitals filed for this visit.  There is no height or weight on file to calculate BMI.  Physical Exam ***  No results found for this or any previous visit (from the past 2160 hour(s)).  Diabetic Foot Exam: Diabetic Foot Exam - Simple   No data filed    ***  PHQ2/9: Depression screen [facility] 2/9 12/01/2019 12/01/2019 09/25/2017 06/25/2017  Decreased Interest 2 0 0 0  Down, Depressed, Hopeless 1 0 3 0  PHQ - 2 Score 3 0 3 0  Altered sleeping 0 - 0 -  Tired, decreased energy 1 - 3 -  Change in appetite 1 - 1 -  Feeling bad or failure about yourself  0 - 0 -  Trouble concentrating 0 - 0 -  Moving slowly or fidgety/restless 0 - 0 -  Suicidal thoughts 0 - 0 -  PHQ-9 Score 5 - 7 -  Difficult doing work/chores Somewhat difficult - Not difficult at all -    phq 9 is {gen pos LFY:[id number]} ***  Fall Risk: Fall Risk  12/01/2019 09/25/2017 06/25/2017  Falls in the past year? 0 Yes No  Number falls in past yr: 0 1 -  Injury with Fall? 0 No -   ***   Functional Status Survey:   ***   Assessment & Plan  *** There are no diagnoses linked to this encounter.

## 2020-07-19 ENCOUNTER — Encounter: Payer: Self-pay | Admitting: Emergency Medicine

## 2020-07-19 ENCOUNTER — Other Ambulatory Visit: Payer: Self-pay

## 2020-07-19 ENCOUNTER — Inpatient Hospital Stay
Admission: EM | Admit: 2020-07-19 | Discharge: 2020-07-21 | DRG: 511 | Disposition: A | Discharge status: Home / Self Care | Payer: PRIVATE HEALTH INSURANCE | Attending: Internal Medicine | Admitting: Internal Medicine

## 2020-07-19 ENCOUNTER — Emergency Department: Payer: PRIVATE HEALTH INSURANCE

## 2020-07-19 DIAGNOSIS — Z7982 Long term (current) use of aspirin: Secondary | ICD-10-CM

## 2020-07-19 DIAGNOSIS — S299XXA Unspecified injury of thorax, initial encounter: Secondary | ICD-10-CM

## 2020-07-19 DIAGNOSIS — D72829 Elevated white blood cell count, unspecified: Secondary | ICD-10-CM | POA: Diagnosis not present

## 2020-07-19 DIAGNOSIS — Y929 Unspecified place or not applicable: Secondary | ICD-10-CM

## 2020-07-19 DIAGNOSIS — F1010 Alcohol abuse, uncomplicated: Secondary | ICD-10-CM | POA: Diagnosis present

## 2020-07-19 DIAGNOSIS — S52202A Unspecified fracture of shaft of left ulna, initial encounter for closed fracture: Secondary | ICD-10-CM | POA: Diagnosis not present

## 2020-07-19 DIAGNOSIS — Z9114 Patient's other noncompliance with medication regimen: Secondary | ICD-10-CM | POA: Diagnosis not present

## 2020-07-19 DIAGNOSIS — I480 Paroxysmal atrial fibrillation: Secondary | ICD-10-CM | POA: Diagnosis not present

## 2020-07-19 DIAGNOSIS — E538 Deficiency of other specified B group vitamins: Secondary | ICD-10-CM | POA: Diagnosis present

## 2020-07-19 DIAGNOSIS — S42111A Displaced fracture of body of scapula, right shoulder, initial encounter for closed fracture: Secondary | ICD-10-CM | POA: Diagnosis present

## 2020-07-19 DIAGNOSIS — S52201A Unspecified fracture of shaft of right ulna, initial encounter for closed fracture: Secondary | ICD-10-CM | POA: Diagnosis not present

## 2020-07-19 DIAGNOSIS — I10 Essential (primary) hypertension: Secondary | ICD-10-CM | POA: Diagnosis present

## 2020-07-19 DIAGNOSIS — R42 Dizziness and giddiness: Secondary | ICD-10-CM | POA: Diagnosis present

## 2020-07-19 DIAGNOSIS — G5602 Carpal tunnel syndrome, left upper limb: Secondary | ICD-10-CM | POA: Diagnosis present

## 2020-07-19 DIAGNOSIS — E46 Unspecified protein-calorie malnutrition: Secondary | ICD-10-CM | POA: Diagnosis present

## 2020-07-19 DIAGNOSIS — S5291XA Unspecified fracture of right forearm, initial encounter for closed fracture: Secondary | ICD-10-CM

## 2020-07-19 DIAGNOSIS — Y900 Blood alcohol level of less than 20 mg/100 ml: Secondary | ICD-10-CM | POA: Diagnosis present

## 2020-07-19 DIAGNOSIS — Z20822 Contact with and (suspected) exposure to covid-19: Secondary | ICD-10-CM | POA: Diagnosis present

## 2020-07-19 DIAGNOSIS — I951 Orthostatic hypotension: Secondary | ICD-10-CM | POA: Diagnosis present

## 2020-07-19 DIAGNOSIS — S52501A Unspecified fracture of the lower end of right radius, initial encounter for closed fracture: Secondary | ICD-10-CM | POA: Diagnosis present

## 2020-07-19 DIAGNOSIS — Z681 Body mass index (BMI) 19 or less, adult: Secondary | ICD-10-CM | POA: Diagnosis not present

## 2020-07-19 DIAGNOSIS — W19XXXA Unspecified fall, initial encounter: Secondary | ICD-10-CM | POA: Diagnosis present

## 2020-07-19 DIAGNOSIS — F17210 Nicotine dependence, cigarettes, uncomplicated: Secondary | ICD-10-CM | POA: Diagnosis present

## 2020-07-19 DIAGNOSIS — E860 Dehydration: Secondary | ICD-10-CM | POA: Diagnosis present

## 2020-07-19 DIAGNOSIS — W132XXA Fall from, out of or through roof, initial encounter: Secondary | ICD-10-CM | POA: Diagnosis present

## 2020-07-19 DIAGNOSIS — S5292XA Unspecified fracture of left forearm, initial encounter for closed fracture: Secondary | ICD-10-CM | POA: Diagnosis not present

## 2020-07-19 DIAGNOSIS — S42101A Fracture of unspecified part of scapula, right shoulder, initial encounter for closed fracture: Secondary | ICD-10-CM | POA: Diagnosis present

## 2020-07-19 DIAGNOSIS — Z79899 Other long term (current) drug therapy: Secondary | ICD-10-CM

## 2020-07-19 DIAGNOSIS — S52502A Unspecified fracture of the lower end of left radius, initial encounter for closed fracture: Principal | ICD-10-CM | POA: Diagnosis present

## 2020-07-19 DIAGNOSIS — I712 Thoracic aortic aneurysm, without rupture: Secondary | ICD-10-CM | POA: Diagnosis present

## 2020-07-19 DIAGNOSIS — E785 Hyperlipidemia, unspecified: Secondary | ICD-10-CM | POA: Diagnosis present

## 2020-07-19 DIAGNOSIS — S2249XA Multiple fractures of ribs, unspecified side, initial encounter for closed fracture: Secondary | ICD-10-CM | POA: Diagnosis present

## 2020-07-19 DIAGNOSIS — E871 Hypo-osmolality and hyponatremia: Secondary | ICD-10-CM | POA: Diagnosis present

## 2020-07-19 DIAGNOSIS — Z8781 Personal history of (healed) traumatic fracture: Secondary | ICD-10-CM

## 2020-07-19 DIAGNOSIS — S2241XA Multiple fractures of ribs, right side, initial encounter for closed fracture: Secondary | ICD-10-CM | POA: Diagnosis not present

## 2020-07-19 DIAGNOSIS — R55 Syncope and collapse: Secondary | ICD-10-CM

## 2020-07-19 DIAGNOSIS — S42114A Nondisplaced fracture of body of scapula, right shoulder, initial encounter for closed fracture: Secondary | ICD-10-CM | POA: Diagnosis not present

## 2020-07-19 HISTORY — DX: Essential (primary) hypertension: I10

## 2020-07-19 HISTORY — DX: Hyperlipidemia, unspecified: E78.5

## 2020-07-19 LAB — CBC WITH DIFFERENTIAL/PLATELET
Abs Immature Granulocytes: 0.33 10*3/uL — ABNORMAL HIGH (ref 0.00–0.07)
Basophils Absolute: 0.1 10*3/uL (ref 0.0–0.1)
Basophils Relative: 0 %
Eosinophils Absolute: 0.1 10*3/uL (ref 0.0–0.5)
Eosinophils Relative: 1 %
HCT: 40.8 % (ref 39.0–52.0)
Hemoglobin: 14.4 g/dL (ref 13.0–17.0)
Immature Granulocytes: 3 %
Lymphocytes Relative: 13 %
Lymphs Abs: 1.7 10*3/uL (ref 0.7–4.0)
MCH: 33.2 pg (ref 26.0–34.0)
MCHC: 35.3 g/dL (ref 30.0–36.0)
MCV: 94 fL (ref 80.0–100.0)
Monocytes Absolute: 1.3 10*3/uL — ABNORMAL HIGH (ref 0.1–1.0)
Monocytes Relative: 11 %
Neutro Abs: 9 10*3/uL — ABNORMAL HIGH (ref 1.7–7.7)
Neutrophils Relative %: 72 %
Platelets: 326 10*3/uL (ref 150–400)
RBC: 4.34 MIL/uL (ref 4.22–5.81)
RDW: 11.5 % (ref 11.5–15.5)
WBC: 12.4 10*3/uL — ABNORMAL HIGH (ref 4.0–10.5)
nRBC: 0 % (ref 0.0–0.2)

## 2020-07-19 LAB — BASIC METABOLIC PANEL
Anion gap: 11 (ref 5–15)
BUN: 8 mg/dL (ref 8–23)
CO2: 23 mmol/L (ref 22–32)
Calcium: 8.8 mg/dL — ABNORMAL LOW (ref 8.9–10.3)
Chloride: 95 mmol/L — ABNORMAL LOW (ref 98–111)
Creatinine, Ser: 0.66 mg/dL (ref 0.61–1.24)
GFR, Estimated: >60 mL/min (ref >60)
Glucose, Bld: 118 mg/dL — ABNORMAL HIGH (ref 70–99)
Potassium: 4.1 mmol/L (ref 3.5–5.1)
Sodium: 129 mmol/L — ABNORMAL LOW (ref 135–145)

## 2020-07-19 LAB — RESP PANEL BY RT-PCR (FLU A&B, COVID) ARPGX2
Influenza A by PCR: NEGATIVE
Influenza B by PCR: NEGATIVE
SARS Coronavirus 2 by RT PCR: NEGATIVE

## 2020-07-19 LAB — MAGNESIUM: Magnesium: 2 mg/dL (ref 1.7–2.4)

## 2020-07-19 MED ORDER — LORAZEPAM 1 MG PO TABS: 1.0000 mg | ORAL_TABLET | ORAL | Status: DC | PRN

## 2020-07-19 MED ORDER — ACETAMINOPHEN 325 MG PO TABS: 650.0000 mg | ORAL_TABLET | Freq: Four times a day (QID) | ORAL | Status: DC | PRN

## 2020-07-19 MED ORDER — ONDANSETRON HCL 4 MG/2ML IJ SOLN
4.0000 mg | Freq: Four times a day (QID) | INTRAMUSCULAR | Status: DC | PRN
  Administered 2020-07-19: 4 mg via INTRAVENOUS
  Filled 2020-07-19: qty 2

## 2020-07-19 MED ORDER — OCUVITE-LUTEIN PO CAPS
1.0000 | ORAL_CAPSULE | Freq: Every day | ORAL | Status: DC
  Administered 2020-07-21: 1 via ORAL
  Filled 2020-07-19: qty 1

## 2020-07-19 MED ORDER — FOLIC ACID 1 MG PO TABS
1.0000 mg | ORAL_TABLET | Freq: Every day | ORAL | Status: DC
  Administered 2020-07-21: 1 mg via ORAL
  Filled 2020-07-19: qty 1

## 2020-07-19 MED ORDER — HYDROMORPHONE HCL 1 MG/ML IJ SOLN
1.0000 mg | INTRAMUSCULAR | Status: DC | PRN
Start: 2020-07-19 — End: 2020-07-21
  Administered 2020-07-20 – 2020-07-21 (×9): 1 mg via INTRAVENOUS
  Filled 2020-07-19 (×9): qty 1

## 2020-07-19 MED ORDER — LORAZEPAM 2 MG/ML IJ SOLN: 1.0000 mg | INTRAMUSCULAR | Status: DC | PRN

## 2020-07-19 MED ORDER — THIAMINE HCL 100 MG/ML IJ SOLN
Freq: Once | INTRAVENOUS | Status: AC
  Filled 2020-07-19: qty 1000

## 2020-07-19 MED ORDER — ACETAMINOPHEN 650 MG RE SUPP: 650.0000 mg | Freq: Four times a day (QID) | RECTAL | Status: DC | PRN

## 2020-07-19 MED ORDER — HYDROMORPHONE HCL 1 MG/ML IJ SOLN
0.5000 mg | INTRAMUSCULAR | Status: DC | PRN
  Administered 2020-07-19: 0.5 mg via INTRAVENOUS
  Filled 2020-07-19: qty 1

## 2020-07-19 MED ORDER — HYDROMORPHONE HCL 1 MG/ML IJ SOLN: 0.5000 mg | Freq: Once | INTRAMUSCULAR | Status: AC

## 2020-07-19 MED ORDER — MORPHINE SULFATE (PF) 4 MG/ML IV SOLN
4.0000 mg | Freq: Once | INTRAVENOUS | Status: AC
Start: 2020-07-19 — End: 2020-07-19
  Administered 2020-07-19: 4 mg via INTRAVENOUS
  Filled 2020-07-19: qty 1

## 2020-07-19 MED ORDER — HYDROMORPHONE HCL 1 MG/ML IJ SOLN
INTRAMUSCULAR | Status: AC
  Administered 2020-07-19: 0.5 mg via INTRAVENOUS
  Filled 2020-07-19: qty 1

## 2020-07-19 MED ORDER — NICOTINE 21 MG/24HR TD PT24
21.0000 mg | MEDICATED_PATCH | Freq: Once | TRANSDERMAL | Status: AC
  Administered 2020-07-19: 21 mg via TRANSDERMAL
  Filled 2020-07-19: qty 1

## 2020-07-19 MED ORDER — HYDROMORPHONE HCL 1 MG/ML IJ SOLN
0.5000 mg | Freq: Once | INTRAMUSCULAR | Status: AC
  Administered 2020-07-19: 1 mg via INTRAVENOUS
  Filled 2020-07-19: qty 1

## 2020-07-19 MED ORDER — ALBUTEROL SULFATE (2.5 MG/3ML) 0.083% IN NEBU
2.5000 mg | INHALATION_SOLUTION | RESPIRATORY_TRACT | Status: DC | PRN
Start: 2020-07-19 — End: 2020-07-21

## 2020-07-19 MED ORDER — THIAMINE HCL 100 MG PO TABS
100.0000 mg | ORAL_TABLET | Freq: Every day | ORAL | Status: DC
  Administered 2020-07-21: 100 mg via ORAL
  Filled 2020-07-19: qty 1

## 2020-07-19 MED ORDER — LIDOCAINE HCL (PF) 1 % IJ SOLN
30.0000 mL | Freq: Once | INTRAMUSCULAR | Status: AC
  Administered 2020-07-19: 30 mL
  Filled 2020-07-19: qty 30

## 2020-07-19 NOTE — ED Notes (Signed)
MD Jessup at bedside. Verbal for additional 0.5mg  of Dilaudid at this time for extreme pain control

## 2020-07-19 NOTE — Progress Notes (Signed)
Brief note regarding plan, with full H&P to follow:  60 year old male with history of hypertension, hyperlipidemia, chronic alcohol abuse, who is admitted for multiple acute fractures, including multiple consecutive right-sided rib fractures as well as acute bilateral distal radius fractures after presenting to [facility] ED for evaluation of a one-story fall off of the roof earlier today.  Bilateral distal radius fractures were reduced by the EDP.  Dr. Rudene Christians of orthopedic surgery has been consulted, and plans to evaluate patient in the morning.  Plan for supportive/symptomatic management overnight.  In setting of a history of chronic alcohol abuse, with most recent alcohol consumed earlier in the afternoon on 07/19/2020, I have also ordered CIWA protocol and banana bag.  As needed IV Dilaudid for pain.     Babs Bertin, DO Hospitalist

## 2020-07-19 NOTE — ED Triage Notes (Signed)
Pt comes into the ED via POV c/o fall from a single story roof.  Pt states he stood up on the roof, got dizzy and then rolled off the roof.  Pt attempted to catch himself and presents with bilateral deformities to both wrists.  Pt states she is also hurting in the right side of the ribs and shoulder.  Pt able to speak in full sentences and has even and unlabored respirations.

## 2020-07-19 NOTE — ED Provider Notes (Signed)
[facility] Emergency Department Provider Note   ____________________________________________   Event Date/Time   First MD Initiated Contact with Patient 07/19/20 1715     (approximate)  I have reviewed the triage vital signs and the nursing notes.   HISTORY  Chief Complaint Fall    HPI Kristopher [name] is a 60 y.o. male with past medical history of hyperlipidemia, paroxysmal atrial fibrillation, and alcohol abuse who presents to the ED following fall.  Patient states that he had gone up onto the roof of his house to get some work done, stood up too quickly after getting there and started to feel dizzy.  He fell off the roof onto his right side, reports striking his right shoulder and right chest wall but denies hitting his head or losing consciousness.  He does not take any blood thinners for his paroxysmal atrial fibrillation.  He immediately noticed obvious deformity to both wrists, additionally complains of pain at his right shoulder and along his right chest wall.  He denies any headache, neck pain, abdominal pain, hip pain, or lower extremity pain.  He has been ambulatory since the fall but states it is difficult for him to lift up his right arm.  While he reports brief dizziness prior to falling, he denies feeling like he was going to pass out, denies associated chest pain or shortness of breath.        Past Medical History:  Diagnosis Date  . Alcohol abuse   . Anemia   . Electric injury   . HLD (hyperlipidemia)   . HTN (hypertension)   . PAF (paroxysmal atrial fibrillation) (HCC)    a. in the setting of PNA with empyema; b. CHADS2VASc => 0; c. TTE 1/19: EF of 55-60%, unable to exlcude RWMA, mitral valve with systolic bowing without prolapse, mildly dilated RV cavity size with normal RV systolic function, mildly dilated right atrium  . PNA (pneumonia)    a. [id number]: complicated by right-sided empyema which was treated with chest tube placement and  subsequent bronchoscopy and right thoractomy with decortication and pleural drainage of lung abscess  . Thrombocytosis     Patient Active Problem List   Diagnosis Date Noted  . Ribs, multiple fractures 07/19/2020  . Distal radius fracture, left 07/19/2020  . Distal radius fracture, right 07/19/2020  . Fall 07/19/2020  . Acute hyponatremia 07/19/2020  . Postural dizziness with presyncope 07/19/2020  . Leukocytosis 07/19/2020  . Closed right scapular fracture 07/19/2020  . Paroxysmal atrial fibrillation ([place]) 12/01/2019  . Prostate cancer screening 09/25/2017  . Vitamin B12 deficiency 06/28/2017  . Dyslipidemia 06/28/2017  . Hx of pleural empyema 06/25/2017  . Hx of tobacco use, presenting hazards to health 06/25/2017  . Aortic atherosclerosis ([place]) 06/25/2017  . Alcohol use 06/25/2017  . Abnormal thyroid exam 06/25/2017    Past Surgical History:  Procedure Laterality Date  . DECORTICATION N/A 04/03/2017   Procedure: DECORTICATION;  Surgeon: Nestor Lewandowsky, MD;  Location: [facility] ORS;  Service: Thoracic;  Laterality: N/A;  debridement of lung abscess  . SKIN GRAFT    . THORACOTOMY Right 04/03/2017   Procedure: THORACOTOMY MAJOR;  Surgeon: Nestor Lewandowsky, MD;  Location: [facility] ORS;  Service: Thoracic;  Laterality: Right;  Marland Kitchen VIDEO BRONCHOSCOPY N/A 04/03/2017   Procedure: PREOP BRONCHOSCOPY;  Surgeon: Nestor Lewandowsky, MD;  Location: [facility] ORS;  Service: Thoracic;  Laterality: N/A;    Prior to Admission medications   Medication Sig Start Date End Date Taking? Authorizing Provider  albuterol (  VENTOLIN HFA) 108 (90 Base) MCG/ACT inhaler Inhale 2 puffs into the lungs every 6 (six) hours as needed for wheezing or shortness of breath. Patient not taking: No sig reported 12/01/19   Steele Sizer, MD  aspirin EC 81 MG tablet Take 1 tablet (81 mg total) by mouth daily. Patient not taking: No sig reported 04/11/17   Hillary Bow, MD  atorvastatin (LIPITOR) 20 MG tablet Take 1 tablet (20 mg total) by  mouth at bedtime. Patient not taking: No sig reported 12/01/19   Steele Sizer, MD  Multiple Vitamins-Minerals (MULTIVITAMIN ADULT PO) Take by mouth daily. Patient not taking: No sig reported    [provider]  umeclidinium-vilanterol (ANORO ELLIPTA) 62.5-25 MCG/INH AEPB Inhale 1 puff into the lungs daily. Patient not taking: No sig reported 12/01/19   Steele Sizer, MD  valsartan (DIOVAN) 160 MG tablet Take 1 tablet (160 mg total) by mouth daily. Patient not taking: No sig reported 12/01/19   Steele Sizer, MD    Allergies Patient has no known allergies.  Family History  Problem Relation Age of Onset  . Atrial fibrillation Mother   . Dementia Mother     Social History Social History   Tobacco Use  . Smoking status: Current Every Day Smoker    Packs/day: 1.50    Years: 35.00    Pack years: 52.50    Types: Cigarettes  . Smokeless tobacco: Never Used  Vaping Use  . Vaping Use: Never used  Substance Use Topics  . Alcohol use: Yes    Comment: 42-126 beers (12 oz) per week  . Drug use: No    Review of Systems  Constitutional: No fever/chills Eyes: No visual changes. ENT: No sore throat. Cardiovascular: Positive for dizziness and chest wall pain. Respiratory: Denies shortness of breath. Gastrointestinal: No abdominal pain.  No nausea, no vomiting.  No diarrhea.  No constipation. Genitourinary: Negative for dysuria. Musculoskeletal: Positive for bilateral wrist pain and right upper arm pain. Skin: Negative for rash. Neurological: Negative for headaches, focal weakness or numbness.  ____________________________________________   PHYSICAL EXAM:  VITAL SIGNS: ED Triage Vitals [07/19/20 1719]  Enc Vitals Group     BP      Pulse      Resp      Temp      Temp src      SpO2      Weight      Height      Head Circumference      Peak Flow      Pain Score 10     Pain Loc      Pain Edu?      Excl. in [place]?     Constitutional: Alert and  oriented. Eyes: Conjunctivae are normal. Head: Atraumatic. Nose: No congestion/rhinnorhea. Mouth/Throat: Mucous membranes are moist. Neck: Normal ROM, no midline cervical spine tenderness to palpation. Cardiovascular: Normal rate, regular rhythm. Grossly normal heart sounds.  2+ radial pulses bilaterally, cap refill less than 2 seconds in digits of bilateral hands. Respiratory: Normal respiratory effort.  No retractions. Lungs CTAB.  Right chest wall tenderness to palpation with no crepitus or obvious deformity. Gastrointestinal: Soft and nontender. No distention. Genitourinary: deferred Musculoskeletal: No lower extremity tenderness nor edema.  Obvious deformity to wrists bilaterally with dorsal angulation, range of motion intact to bilateral hands.  No midline thoracic or lumbar spinal tenderness to palpation. Neurologic:  Normal speech and language. No gross focal neurologic deficits are appreciated.  Sensation and movement intact to bilateral hands.  Skin:  Skin is warm, dry and intact. No rash noted. Psychiatric: Mood and affect are normal. Speech and behavior are normal.  ____________________________________________   LABS (all labs ordered are listed, but only abnormal results are displayed)  Labs Reviewed  CBC WITH DIFFERENTIAL/PLATELET - Abnormal; Notable for the following components:      Result Value   WBC 12.4 (*)    Neutro Abs 9.0 (*)    Monocytes Absolute 1.3 (*)    Abs Immature Granulocytes 0.33 (*)    All other components within normal limits  BASIC METABOLIC PANEL - Abnormal; Notable for the following components:   Sodium 129 (*)    Chloride 95 (*)    Glucose, Bld 118 (*)    Calcium 8.8 (*)    All other components within normal limits  RESP PANEL BY RT-PCR (FLU A&B, COVID) ARPGX2  MAGNESIUM  HIV ANTIBODY (ROUTINE TESTING W REFLEX)  MAGNESIUM  PHOSPHORUS  COMPREHENSIVE METABOLIC PANEL  CBC  CALCIUM, IONIZED  PROTIME-INR  ETHANOL  TSH  OSMOLALITY   URINALYSIS, ROUTINE W REFLEX MICROSCOPIC  OSMOLALITY, URINE  SODIUM, URINE, RANDOM   ____________________________________________  EKG  ED ECG REPORT I, Blake Divine, the attending physician, personally viewed and interpreted this ECG.   Date: 07/19/2020  EKG Time: 17:28  Rate: 81  Rhythm: normal sinus rhythm  Axis: LAD  Intervals:left anterior fascicular block  ST&T Change: None   PROCEDURES  Procedure(s) performed (including Critical Care):  .Critical Care Performed by: Blake Divine, MD Authorized by: Blake Divine, MD   Critical care provider statement:    Critical care time (minutes):  45   Critical care time was exclusive of:  Separately billable procedures and treating other patients and teaching time   Critical care was necessary to treat or prevent imminent or life-threatening deterioration of the following conditions:  Trauma   Critical care was time spent personally by me on the following activities:  Discussions with consultants, evaluation of patient's response to treatment, examination of patient, ordering and performing treatments and interventions, ordering and review of laboratory studies, ordering and review of radiographic studies, pulse oximetry, re-evaluation of patient's condition, obtaining history from patient or surrogate and review of old charts   I assumed direction of critical care for this patient from another provider in my specialty: no     Care discussed with: admitting provider   .Ortho Injury Treatment  Date/Time: 07/19/2020 11:01 PM Performed by: Blake Divine, MD Authorized by: Blake Divine, MD   Consent:    Consent obtained:  Verbal   Consent given by:  Patient   Risks discussed:  Vascular damage, restricted joint movement, nerve damage, fracture and stiffness   Alternatives discussed:  Delayed treatment and immobilizationInjury location: wrist Location details: left wrist Injury type: fracture Fracture type: distal  radius Pre-procedure neurovascular assessment: neurovascularly intact Pre-procedure distal perfusion: normal Pre-procedure neurological function: normal Pre-procedure range of motion: reduced Anesthesia: hematoma block  Anesthesia: Local anesthesia used: yes Local Anesthetic: lidocaine 1% without epinephrine Anesthetic total: 8 mL  Patient sedated: NoManipulation performed: yes Skin traction used: yes Skeletal traction used: yes Reduction successful: yes X-ray confirmed reduction: yes Immobilization: splint Splint type: sugar tong Splint Applied by: ED Provider Supplies used: cotton padding,  elastic bandage and Ortho-Glass Post-procedure neurovascular assessment: post-procedure neurovascularly intact Post-procedure distal perfusion: normal Post-procedure neurological function: normal Post-procedure range of motion: improved  .Ortho Injury Treatment  Date/Time: 07/19/2020 11:03 PM Performed by: Blake Divine, MD Authorized by: Blake Divine, MD   Consent:  Consent obtained:  Verbal   Consent given by:  Patient   Risks discussed:  Fracture, nerve damage, restricted joint movement, vascular damage and stiffness   Alternatives discussed:  Delayed treatment and immobilizationInjury location: wrist Location details: right wrist Injury type: fracture Fracture type: distal radius Pre-procedure neurovascular assessment: neurovascularly intact Pre-procedure distal perfusion: normal Pre-procedure neurological function: normal Pre-procedure range of motion: reduced Anesthesia: hematoma block  Anesthesia: Local anesthesia used: yes Local Anesthetic: lidocaine 1% without epinephrine Anesthetic total: 5 mL  Patient sedated: NoManipulation performed: yes Skin traction used: yes Skeletal traction used: yes Reduction successful: Partially successful. X-ray confirmed reduction: yes Immobilization: splint Splint type: sugar tong Splint Applied by: ED Provider Supplies  used: Ortho-Glass,  elastic bandage and cotton padding Post-procedure neurovascular assessment: post-procedure neurovascularly intact Post-procedure distal perfusion: normal Post-procedure neurological function: normal Post-procedure range of motion: unchanged      ____________________________________________   INITIAL IMPRESSION / ASSESSMENT AND PLAN / ED COURSE       60 year old male with past medical history of hyperlipidemia, alcohol abuse, and paroxysmal atrial fibrillation not currently on anticoagulation, who presents to the ED after feeling briefly dizzy and falling off a about a 10 foot roof, striking his right side.  Patient denies hitting his head or losing consciousness, does complain of bilateral wrist pain and right chest wall pain.  Given significant mechanism, we will further assess with CT head, cervical spine, chest/abdomen/pelvis.  Due to limited supply of IV contrast, CT scan of his chest/abdomen/pelvis will be performed without contrast given he is hemodynamically stable.  We will treat pain with IV morphine, further assess injuries to his bilateral wrist and shoulder with x-ray.  He is neurovascularly intact to all 4 extremities.  Wrist x-rays reviewed by me and show bilateral Colles' fractures with dorsal angulation and displacement.  CT head and cervical spine are negative for acute process.  CT of chest/abdomen/pelvis are significant for right scapular fracture as well as multiple right-sided rib fractures.  There is no evidence of hemopneumothorax or pneumothorax, no hematoma associated with scapular fracture.  Scapular fracture is likely contributing to patient's difficulty lifting up his right arm.  Hematoma block was performed bilaterally along with attempted reduction of bilateral wrists.  Left wrist was successfully reduced however minimal improvement with attempted reduction of right wrist.  Case discussed with Dr. Rudene Christians of orthopedics, who will follow during  admission and tentatively plan for operative repair tomorrow.  Case discussed with hospitalist for admission for further pain management of multiple rib fractures and scapular fracture.      ____________________________________________   FINAL CLINICAL IMPRESSION(S) / ED DIAGNOSES  Final diagnoses:  Chest trauma  Closed fracture of multiple ribs of right side, initial encounter  Closed nondisplaced fracture of body of right scapula, initial encounter  Closed fracture of bilateral radius and ulna, initial encounter  Fall, initial encounter  Dizziness     ED Discharge Orders    None       Note:  This document was prepared using Dragon voice recognition software and may include unintentional dictation errors.   Blake Divine, MD 07/19/20 [phone]

## 2020-07-19 NOTE — ED Notes (Signed)
Hospitalist at bedside 

## 2020-07-19 NOTE — H&P (Addendum)
History and Physical    PLEASE NOTE THAT DRAGON DICTATION SOFTWARE WAS USED IN THE CONSTRUCTION OF THIS NOTE.   Kristopher [name] JKD:[id number] DOB: 11/25/1960 DOA: 07/19/2020  PCP: Alba Cory, MD Patient coming from: home   I have personally briefly reviewed patient's old medical records in [facility]  Chief Complaint: Fall off of roof  HPI: Kristopher [name] is a 60 y.o. male with medical history significant for paroxysmal atrial fibrillation for which he is not chronically anticoagulated, pneumonia complicated by empyema in January 2019, chronic alcohol abuse, who is admitted to [facility] on 07/19/2020 with multiple consecutive acute right-sided rib fractures and bilateral distal radius fractures after presenting to [facility] ED for evaluation of fall off of a roof.   The patient reports that he was working on a first story roof for much of the day today, when he attempted to rise from a seated to a standing position at which time he reports development of acute dizziness and lightheadedness, resulting in a fall to the ground below.  He notes landing on his right side in the setting of bilateral outstretched arms.  He does not believe that he hit his head as a component of this fall, and denies any associated loss of consciousness.  He also denies any chest pain preceding the development of dizziness.  Denies any associated shortness of breath, diaphoresis, palpitations.  Denies any associated headache, neck pain, neck stiffness, acute change in vision, vertigo, acute focal weakness, acute focal numbness or paresthesias.  He reports that temperatures on the roof of were in excess of 80 degrees F, and that he had consumed very little water over the course of today to compensate for working in these temperatures.   Upon landing on the ground as a consequence of the above fall, the patient reports immediate development of bilateral wrist discomfort as well as right anterior  lateral chest discomfort.  He notes that the right-sided chest discomfort has been constant since onset, with exacerbation with deep inspiration.  Denies any associated left-sided chest discomfort.  Not associate with any lower extremity discomfort, including no calf tenderness, erythema, or edema. Not associate with any cough, subjective fever, chills, rigors.  Not associate with any nausea, vomiting, abdominal pain, or diarrhea.  No recent traveling or known COVID-19 exposures.  Denies any acute urinary symptoms, including no recent dysuria, gross hematuria, or change in urinary urgency/frequency.   Patient denies any known underlying coronary artery disease.  He also denies any known history of chronic heart failure.  Per chart review, it appears that he has a history of paroxysmal atrial fibrillation in the setting of a single episode atrial fibrillation at the time of diagnosis of pneumonia complicated by empyema in January 2019.  He reports that he is not chronically anticoagulated, and denies use of any blood thinning agents at home, including no aspirin.  Additionally, he reports that he takes no prescription medications as an outpatient.  Of note, the patient acknowledges a ongoing history of chronic alcohol abuse, reporting that he typically consumes a six pack of 12 oz beers per day, and that he has been consuming beer at this daily volume for several years now.  He notes most recent alcohol consumption in the early afternoon of 07/19/2020, and denies any prior history of alcohol withdrawal symptoms when abstaining from alcohol.  He conveys no desire to stop consuming alcohol at this time.  The patient verbally conveyed his consent for me to discuss  his case with his daughter, who was present at bedside.  The patient's daughter conveys that the patient is downplaying his volume of typical daily alcohol consumption, reporting that she feels that this volume is more consistent with consumption of 18  twelve oz beers per day.      ED Course:  Vital signs in the ED were notable for the following: Tetramex 98.7, heart rate 67-80; blood pressure 133/86 -142/91; respiratory rate 17-22; oxygen saturation 96 to 100% on room air.  Labs were notable for the following: BMP was notable for the following: Sodium 129 relative to 134 in September 2021 as well as 137 in July 2019, potassium 4.1, chloride 95, bicarbonate 23, creatinine 0.66, glucose 118.  CBC notable for white blood cell count of 12,400 with 72% neutrophils.  Nasopharyngeal COVID-19/influenza PCR was checked in the emergency department today, and found to be negative.  EKG showed sinus rhythm with left anterior fascicular, heart rate 81, no evidence of T wave or ST changes, including no evidence of ST elevation.  Noncontrast CT of the head showed no evidence of acute intracranial process, including no evidence of intracranial hemorrhage.  CT of the cervical spine showed no evidence of acute osseous cervical abnormality of the cervical spine.  CT chest/abdomen/pelvis showed the following: Comminuted fracture of the scapular body on the right extending inferiorly to the scapular tip in the absence of hematoma; mildly displaced fracture of the antero- lateral aspect of the right fifth rib; acute fractures of the lateral aspects of the right sixth and seventh ribs without evidence of displacement; undisplaced fracture of the right seventh rib located central to the costovertebral margin; no evidence of pneumothorax; old healed rib fractures were also noted; additionally, CT abdomen showed evidence of mild dilation of the ascending aorta to 4 cm without evidence of dissection, and otherwise showed no evidence of acute intra-abdominal process.   Plain films of the right wrist showed Comminuted fracture of the distal radial metaphysis with dorsal angulation distally and impaction at the fracture site, without evidence of additional fracture.  Plain films  of the left wrist showed Comminuted fracture of the distal radial metaphysis with dorsal displacement and angulation distally with approximately 2 cm of overriding fracture fragments.   The emergency department physician reduced the bilateral distal radius fractures, with postreduction plain films of the bilateral wrists ordered and pending at this time.  Additionally, the patient's case was discussed with the on-call orthopedic surgeon, Dr. Rudene Christians, who will formally consult. He plans to evaluate the patient in the morning (07/20/20) and recommended admission to the hospitalist service for pain control and supportive measures in the meantime.   While in the ED, the following were administered: Dilaudid 0.5 mg IV x2, morphine 4 mg IV x1.    Review of Systems: As per HPI otherwise 10 point review of systems negative.   Past Medical History:  Diagnosis Date  . Alcohol abuse   . Anemia   . Electric injury   . PAF (paroxysmal atrial fibrillation) (HCC)    a. in the setting of PNA with empyema; b. CHADS2VASc => 0; c. TTE 1/19: EF of 55-60%, unable to exlcude RWMA, mitral valve with systolic bowing without prolapse, mildly dilated RV cavity size with normal RV systolic function, mildly dilated right atrium  . PNA (pneumonia)    a. [id number]: complicated by right-sided empyema which was treated with chest tube placement and subsequent bronchoscopy and right thoractomy with decortication and pleural drainage of lung abscess  .  Thrombocytosis     Past Surgical History:  Procedure Laterality Date  . DECORTICATION N/A 04/03/2017   Procedure: DECORTICATION;  Surgeon: Nestor Lewandowsky, MD;  Location: [facility] ORS;  Service: Thoracic;  Laterality: N/A;  debridement of lung abscess  . SKIN GRAFT    . THORACOTOMY Right 04/03/2017   Procedure: THORACOTOMY MAJOR;  Surgeon: Nestor Lewandowsky, MD;  Location: [facility] ORS;  Service: Thoracic;  Laterality: Right;  Marland Kitchen VIDEO BRONCHOSCOPY N/A 04/03/2017   Procedure: PREOP BRONCHOSCOPY;   Surgeon: Nestor Lewandowsky, MD;  Location: [facility] ORS;  Service: Thoracic;  Laterality: N/A;    Social History:  reports that he has been smoking cigarettes. He has a 52.50 pack-year smoking history. He has never used smokeless tobacco. He reports current alcohol use. He reports that he does not use drugs.   No Known Allergies  Family History  Problem Relation Age of Onset  . Atrial fibrillation Mother   . Dementia Mother     Prior to Admission medications   Medication Sig Start Date End Date Taking? Authorizing Provider  albuterol (VENTOLIN HFA) 108 (90 Base) MCG/ACT inhaler Inhale 2 puffs into the lungs every 6 (six) hours as needed for wheezing or shortness of breath. 12/01/19   Steele Sizer, MD  aspirin EC 81 MG tablet Take 1 tablet (81 mg total) by mouth daily. Patient not taking: Reported on 12/01/2019 04/11/17   Hillary Bow, MD  atorvastatin (LIPITOR) 20 MG tablet Take 1 tablet (20 mg total) by mouth at bedtime. 12/01/19   Steele Sizer, MD  Multiple Vitamins-Minerals (MULTIVITAMIN ADULT PO) Take by mouth daily.    [provider]  umeclidinium-vilanterol (ANORO ELLIPTA) 62.5-25 MCG/INH AEPB Inhale 1 puff into the lungs daily. 12/01/19   Steele Sizer, MD  valsartan (DIOVAN) 160 MG tablet Take 1 tablet (160 mg total) by mouth daily. 12/01/19   Steele Sizer, MD     Objective    Physical Exam: Vitals:   07/19/20 1719 07/19/20 1722 07/19/20 2038 07/19/20 2038  BP:  133/86 (!) 142/91   Pulse:  80  67  Resp:  17 (!) 22 18  Temp:  98.7 F (37.1 C)    TempSrc:  Oral    SpO2:  100% 96% 97%  Weight: 63.5 kg     Height: 6' (1.829 m)       General: appears to be stated age; alert, oriented Skin: warm, dry, no rash Head:  AT/[place] Mouth:  Oral mucosa membranes appear dry, normal dentition Neck: supple; trachea midline Heart:  RRR; did not appreciate any M/R/G Lungs: CTAB, did not appreciate any wheezes, rales, or rhonchi Abdomen: + BS; soft, ND, NT Vascular: 2+  pedal pulses b/l; 2+ radial pulses b/l Extremities: no peripheral edema, no muscle wasting  Neuro: sensation intact in upper and lower extremities b/l; in the setting of presenting bilateral distal radial fractures, strength of the upper extremities was not assessed at this time    Labs on Admission: I have personally reviewed following labs and imaging studies  CBC: Recent Labs  Lab 07/19/20 1755  WBC 12.4*  NEUTROABS 9.0*  HGB 14.4  HCT 40.8  MCV 94.0  PLT 694   Basic Metabolic Panel: Recent Labs  Lab 07/19/20 1755  NA 129*  K 4.1  CL 95*  CO2 23  GLUCOSE 118*  BUN 8  CREATININE 0.66  CALCIUM 8.8*   GFR: Estimated Creatinine Clearance: 87.1 mL/min (by C-G formula based on SCr of 0.66 mg/dL). Liver Function Tests: No results for input(s):  AST, ALT, ALKPHOS, BILITOT, PROT, ALBUMIN in the last 168 hours. No results for input(s): LIPASE, AMYLASE in the last 168 hours. No results for input(s): AMMONIA in the last 168 hours. Coagulation Profile: No results for input(s): INR, PROTIME in the last 168 hours. Cardiac Enzymes: No results for input(s): CKTOTAL, CKMB, CKMBINDEX, TROPONINI in the last 168 hours. BNP (last 3 results) No results for input(s): PROBNP in the last 8760 hours. HbA1C: No results for input(s): HGBA1C in the last 72 hours. CBG: No results for input(s): GLUCAP in the last 168 hours. Lipid Profile: No results for input(s): CHOL, HDL, LDLCALC, TRIG, CHOLHDL, LDLDIRECT in the last 72 hours. Thyroid Function Tests: No results for input(s): TSH, T4TOTAL, FREET4, T3FREE, THYROIDAB in the last 72 hours. Anemia Panel: No results for input(s): VITAMINB12, FOLATE, FERRITIN, TIBC, IRON, RETICCTPCT in the last 72 hours. Urine analysis:    Component Value Date/Time   COLORURINE AMBER (A) 03/01/2017 1339   APPEARANCEUR CLEAR (A) 03/01/2017 1339   LABSPEC 1.025 03/01/2017 1339   PHURINE 6.0 03/01/2017 1339   GLUCOSEU NEGATIVE 03/01/2017 1339   HGBUR NEGATIVE  03/01/2017 1339   BILIRUBINUR NEGATIVE 03/01/2017 1339   KETONESUR NEGATIVE 03/01/2017 1339   PROTEINUR NEGATIVE 03/01/2017 1339   NITRITE NEGATIVE 03/01/2017 1339   LEUKOCYTESUR NEGATIVE 03/01/2017 1339    Radiological Exams on Admission: DG Shoulder Right  Result Date: 07/19/2020 CLINICAL DATA:  Pain following fall EXAM: RIGHT SHOULDER - 2+ VIEW COMPARISON:  None. FINDINGS: Frontal, oblique, and Y scapular images obtained. There is an apparent fracture in the mid scapula region with displaced fracture fragments in this area. No other acute fracture. No dislocation. There is mild generalized joint space narrowing. No erosive change. Visualized right lung clear. IMPRESSION: Fracture mid scapular region with displacement of fracture fragments, best seen in the mid scapular region medially. No other fractures. Mild generalized joint space narrowing. No erosion. Electronically Signed   By: Lowella Grip III M.D.   On: 07/19/2020 18:13   DG Wrist Complete Left  Result Date: 07/19/2020 CLINICAL DATA:  Pain following fall EXAM: LEFT WRIST - COMPLETE 3+ VIEW COMPARISON:  None. FINDINGS: Frontal, oblique, lateral, and ulnar deviation scaphoid images were obtained. There is a comminuted fracture of the distal radial metaphysis with dorsal displacement and angulation of the distal fracture fragment. There is approximately 2 cm of overriding of bony fragments. No other fracture. No dislocation. Mild osteoarthritic change in the first carpal-metacarpal joint. There is a metallic foreign body located volar between the third and fourth distal metacarpals. IMPRESSION: Comminuted fracture distal radial metaphysis with dorsal displacement and angulation distally with approximately 2 cm of overriding of fracture fragments. No other fracture appreciable. No dislocation. Rounded metallic foreign body volar to the distal third and fourth metacarpals. Electronically Signed   By: Lowella Grip III M.D.   On:  07/19/2020 18:11   DG Wrist Complete Right  Result Date: 07/19/2020 CLINICAL DATA:  Pain following fall EXAM: RIGHT WRIST - COMPLETE 3+ VIEW COMPARISON:  None. FINDINGS: Frontal, oblique, lateral, and ulnar deviation scaphoid images were obtained. There is a comminuted fracture of the distal radial metaphysis with dorsal angulation distally and impaction at the fracture site. No other fracture. No dislocation. Evidence of old trauma with remodeling fifth metacarpal. No joint space narrowing or erosion. IMPRESSION: Comminuted fracture distal radial metaphysis with dorsal angulation distally and impaction at the fracture site. Fracture extends into the medial aspect of the radiocarpal joint. No other acute fracture. No dislocation. Remodeling  fifth metacarpal consistent with old trauma in this area. No appreciable joint space narrowing. Electronically Signed   By: Bretta BangWilliam  Woodruff III M.D.   On: 07/19/2020 18:09   CT Head Wo Contrast  Result Date: 07/19/2020 CLINICAL DATA:  Larey SeatFell off roof EXAM: CT HEAD WITHOUT CONTRAST CT CERVICAL SPINE WITHOUT CONTRAST TECHNIQUE: Multidetector CT imaging of the head and cervical spine was performed following the standard protocol without intravenous contrast. Multiplanar CT image reconstructions of the cervical spine were also generated. COMPARISON:  None. FINDINGS: CT HEAD FINDINGS Brain: No acute territorial infarction, hemorrhage or intracranial mass is visualized. The ventricles are nonenlarged. Vascular: No hyperdense vessels.  Carotid vascular calcification Skull: Normal. Negative for fracture or focal lesion. Sinuses/Orbits: Fluid levels in the maxillary sinuses. Mucosal thickening in the ethmoid and frontal sinuses Other: None CT CERVICAL SPINE FINDINGS Alignment: Straightening of the cervical spine. No subluxation. Facet alignment is within normal limits. Skull base and vertebrae: No acute fracture. No primary bone lesion or focal pathologic process. Soft tissues and  spinal canal: No prevertebral fluid or swelling. No visible canal hematoma. Disc levels: Moderate disc space narrowing and degenerative change at C4-C5, C5-C6 and C6-C7. Mild facet degenerative changes. Bilateral foraminal narrowing at these levels. Upper chest: Lung apices show nodular scarring and blebs. Acute nondisplaced right second and third posteromedial rib fractures. Other: None IMPRESSION: 1. No CT evidence for acute intracranial abnormality.  Sinusitis 2. Straightening of the cervical spine with degenerative change. No acute osseous abnormality 3. Acute appearing nondisplaced right second and third posteromedial rib fractures Electronically Signed   By: Jasmine PangKim  Fujinaga M.D.   On: 07/19/2020 18:57   CT Cervical Spine Wo Contrast  Result Date: 07/19/2020 CLINICAL DATA:  Larey SeatFell off roof EXAM: CT HEAD WITHOUT CONTRAST CT CERVICAL SPINE WITHOUT CONTRAST TECHNIQUE: Multidetector CT imaging of the head and cervical spine was performed following the standard protocol without intravenous contrast. Multiplanar CT image reconstructions of the cervical spine were also generated. COMPARISON:  None. FINDINGS: CT HEAD FINDINGS Brain: No acute territorial infarction, hemorrhage or intracranial mass is visualized. The ventricles are nonenlarged. Vascular: No hyperdense vessels.  Carotid vascular calcification Skull: Normal. Negative for fracture or focal lesion. Sinuses/Orbits: Fluid levels in the maxillary sinuses. Mucosal thickening in the ethmoid and frontal sinuses Other: None CT CERVICAL SPINE FINDINGS Alignment: Straightening of the cervical spine. No subluxation. Facet alignment is within normal limits. Skull base and vertebrae: No acute fracture. No primary bone lesion or focal pathologic process. Soft tissues and spinal canal: No prevertebral fluid or swelling. No visible canal hematoma. Disc levels: Moderate disc space narrowing and degenerative change at C4-C5, C5-C6 and C6-C7. Mild facet degenerative changes.  Bilateral foraminal narrowing at these levels. Upper chest: Lung apices show nodular scarring and blebs. Acute nondisplaced right second and third posteromedial rib fractures. Other: None IMPRESSION: 1. No CT evidence for acute intracranial abnormality.  Sinusitis 2. Straightening of the cervical spine with degenerative change. No acute osseous abnormality 3. Acute appearing nondisplaced right second and third posteromedial rib fractures Electronically Signed   By: Jasmine PangKim  Fujinaga M.D.   On: 07/19/2020 18:57   CT CHEST ABDOMEN PELVIS WO CONTRAST  Result Date: 07/19/2020 CLINICAL DATA:  Fall from single story roof with dizziness, initial encounter EXAM: CT CHEST, ABDOMEN AND PELVIS WITHOUT CONTRAST TECHNIQUE: Multidetector CT imaging of the chest, abdomen and pelvis was performed following the standard protocol without IV contrast. COMPARISON:  03/30/2017 FINDINGS: CT CHEST FINDINGS Cardiovascular: Thoracic aorta demonstrates atherosclerotic calcification as well  as aneurysmal dilatation to 4 cm in the ascending portion. Pulmonary artery as visualized appears within normal limits. No cardiac enlargement is seen. Heavy coronary calcifications are noted. Mediastinum/Nodes: Thoracic inlet is within normal limits. No sizable hilar or mediastinal adenopathy is noted. The esophagus as visualized is within normal limits. Lungs/Pleura: Lungs are well aerated bilaterally. Mild mucous plugging is noted in the left lung base. Mild apical scarring is noted. Previously seen chest tube and pneumothorax on the right have resolved. No sizable parenchymal nodules are noted. Mild scarring on the right is seen. Musculoskeletal: Degenerative changes of the thoracic spine are noted. There is a comminuted fracture of the scapular body on the right extending inferiorly to the scapular tip. Mildly displaced fracture of the right fifth rib anterior laterally is noted. Fractures of the sixth and seventh ribs are noted laterally without  displacement. Undisplaced right seventh rib fracture is noted centrally adjacent to the costovertebral margin. Old healed rib fractures are noted on the right as well. CT ABDOMEN PELVIS FINDINGS Hepatobiliary: No focal liver abnormality is seen. No gallstones, gallbladder wall thickening, or biliary dilatation. Pancreas: Unremarkable. No pancreatic ductal dilatation or surrounding inflammatory changes. Spleen: Normal in size without focal abnormality. Adrenals/Urinary Tract: Adrenal glands are within normal limits. Kidneys demonstrate no renal calculi or obstructive changes. The bladder is well distended. Stomach/Bowel: Mild diverticular change of the colon is noted without evidence of diverticulitis. No obstructive or inflammatory changes are seen. The appendix is within normal limits. Small bowel and stomach are unremarkable. Vascular/Lymphatic: Aortic atherosclerosis. No enlarged abdominal or pelvic lymph nodes. Reproductive: Prostate is unremarkable. Other: No abdominal wall hernia or abnormality. No abdominopelvic ascites. Musculoskeletal: No compression deformities are noted. IMPRESSION: Multiple right-sided rib fractures without complicating factors. Right scapular body fracture without significant hematoma. 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 Recommend annual imaging followup by CTA or MRA. This recommendation follows 2010 ACCF/AHA/AATS/ACR/ASA/SCA/SCAI/SIR/STS/SVM Guidelines for the Diagnosis and Management of Patients with Thoracic Aortic Disease. Circulation. 2010; [id number]. Aortic aneurysm NOS (ICD10-I71.9) Diverticulosis without diverticulitis. Electronically Signed   By: Inez Catalina M.D.   On: 07/19/2020 18:55     EKG: Independently reviewed, with result as described above.    Assessment/Plan   KUTTER PARROTT is a 60 y.o. male with medical history significant for paroxysmal atrial fibrillation for which he is not chronically anticoagulated, pneumonia complicated by empyema in  January 2019, chronic alcohol abuse, who is admitted to [facility] on 07/19/2020 with multiple consecutive acute right-sided rib fractures and bilateral distal radius fractures after presenting to [facility] ED for evaluation of fall off of a roof.    Principal Problem:   Ribs, multiple fractures Active Problems:   Paroxysmal atrial fibrillation (HCC)   Distal radius fracture, left   Distal radius fracture, right   Fall   Acute hyponatremia   Postural dizziness with presyncope   Leukocytosis   Closed right scapular fracture     #) Multiple consecutive right-sided rib fractures:  Following fall off the first story roof earlier today without loss of consciousness, presenting imaging shows evidence of nondisplaced fractures of the right sixth and seventh ribs as well as mildly displaced fracture of the right fifth rib, without any evidence of associated hematoma or pneumothorax.  Given the involvement of 3 consecutive ribs, will strive to ensure establishment of adequate pain control in order to promote sufficient inspiratory effort in order to decrease subsequent risk for development of pneumonia should insufficient pain control cannot  be achieved.  At this time, the patient does not appear to be in any respiratory distress, and is maintaining oxygen saturations in the mid to high 90s on room air.  Given the potential for orthopedic surgery in the setting of concomitant acute bilateral distal radius fractures and NPO at MN status, will pursue analgesia via as needed IV Dilaudid for as opposed to the various oral analgesic options that would include acetaminophen to assist with inflammatory contribution to the patient's discomfort.  Could also consider as needed IV Toradol for its anti-inflammatory benefits.  Denies use of any blood thinners, including aspirin, as an outpatient.   Plan: prn IV Dilaudid, as further described above.  Monitor continuous pulse oximetry.  Incentive  spirometry to evaluate for and promote adequate inspiratory effort in the setting of presenting multiple consecutive rib fractures, in part as a means of assessing adequacy of current level of pain control.  Check UA as part of evaluation for any underlying reversible factors contributing to precipitating fall.    As needed albuterol inhaler.         #) Presyncope: without associated loss of consciousness.  Single episode of dizziness/lightheadedness that occurred when rising from a seated to a standing position this afternoon, resulting in the patient's fall from roof at level of 1 story.  Presentation appears most consistent with orthostatic hypotension in the setting of dehydration given the patient's report of very limited consumption of water wall working in greater than 80 degree heat in the direct sun, while engaging in diuresis via alcohol consumption.  Absence of nonprodromal syncope renders the possibility of contributory ventricular arrhythmia to be much less likely.  ACS is also felt to be less likely given the above clinical presentation as well as the absence of any left-sided chest discomfort, while presenting EKG shows no evidence of acute ischemic changes.  No clinical evidence to suggest acute ischemic CVA or seizures at this time.  Of note, patient conveys that he did not hit his head as a component of the aforementioned fall.   Plan: Given some difficulty in optimizing patient pain control in the setting of presenting multiple fractures, will refrain from pursuit of orthostatic vital signs at this time as the patient finally appears to be in a comfortable position.  Monitor strict I's and O's and daily weights.  Provision of IV fluids via banana bag, as detailed below.  Add on serum magnesium level.  Repeat BMP and CBC in the morning.  Add on INR.  Monitor on telemetry.  Counseled the patient on the importance of reduction in alcohol consumption, as further detailed below.  Fall  precautions.      #) Bilateral distal radius fractures: As a consequence of today's fall off of a one-story high roof with mechanism of injury including falling with bilateral outstretched arms,, the patient presents with bilateral wrist pain, with plain films of the bilateral wrists confirming bilateral distal radius fractures, which were reduced by the EDP this evening, as further detailed above, with postreduction plain films of the bilateral wrists ordered and pending at this time.  Additionally, the patient's case was discussed with the on-call orthopedic surgeon, Dr. Rudene Christians, who will formally consult. He plans to evaluate the patient in the morning (07/20/20) and recommended admission to the hospitalist service for pain control and supportive measures in the meantime.  In the setting of these acute fractures, I did not assess strength in the bilateral upper extremities at this time.  However, sensation appears intact  in the bilateral upper extremities, and overall presentation is 1 of being neurovascularly intact.   Of note presenting EKG shows sinus rhythm with no evidence of acute ischemic changes.  The patient denies any recent left-sided chest discomfort, and there is no clinical evidence to suggest an acute MI at this time.  Additionally, the patient denies any known history of chronic heart failure, with no clinical or radiographic evidence to suggest acutely decompensated heart failure at this time.  Consequently, there are no absolute contraindications to proceeding with potential orthopedic surgery, as above. Will check INR, particular given the patient's history of alcohol abuse.  Plan: Orthopedic surgery formally consulted, as above.  N.p.o. after midnight, with orthopedic surgery evaluate the patient in the morning (07/20/20).  As needed IV Dilaudid.  Follow for result of postreduction plain films.  Check INR, as above.      #) Ascending aorta aneurysm: Today's CT imaging demonstrated  mild dilation of the ascending aorta at 4 cm without any dissection.  Per recommendations conveyed by radiology, the patient should undergo monitoring via annual imaging follow-up in the form of CTA or MRA.  Plan: Recommend annual imaging follow-up per the recommendations of radiology, as discussed above.        #) Right scapular body fracture: Imaging reflects fracture of the right scapular body, without evidence of hematoma.  This is a consequence of his presenting off of a one-story high roof, as above.  Patient reports adequate pain control at this time.  Plan: As needed IV Dilaudid, as above.       #) Acute hypoosmolar hyponatremia: Presenting serum sodium level noted to be 129, without need for glycemic adjustment.  This is relative to serum sodium of 134 in September 2021 as well as 137 in July 2019.  Suspect contribution from beer drinkers potomania given report of daily consumption of 6-18 twelve ounce beers on a daily basis dating back over the last several years.  There may also be a hypovolemic contribution from dehydration as a consequence working outside over the course of the last day in 80 degree weather while in the direct sun, while drinking very limited quantities of water.  Will provide gentle IV fluids overnight via banana bag, as further noted below, will further evaluating underlying contributions to presenting hyponatremia, including evaluating for any contribution from SIADH.   Plan: monitor strict I's and O's and daily weights.    Check random urine sodium, urine osmolality.  Check serum osmolality to confirm suspected hypoosmolar etiology.  Repeat BMP in the morning.  Check TSH.  Gentle overnight IV fluids via banana bag, as further detailed below.  Counseled the patient on the importance of reduction in alcohol consumption.  Check urinalysis.       #) Leukocytosis mildly elevated white blood cell count of 12,400.  Suspect that this is reactive in nature in the  setting of the patient's fall earlier today as well as consequential multiple presenting fractures.  There may also be a contribution from hemoconcentration in the setting of perceived mild dehydration, as above.  No evidence of underlying infectious process at this time, including CT chest showing no evidence of acute cardiopulmonary process, including no evidence of pneumonia.  COVID-19/influenza PCR performed in the ED today were found to be negative.  We will also check urinalysis to evaluate for any underlying UTI.   Plan: Check urinalysis, as above.  Repeat CBC with differential in the morning.  IV fluids, as above.       #)  Chronic Alcohol Abuse: the patient reports typical daily alcohol consumption of 6 twelve beers, and that typical daily alcohol consumption of this volume has been occurring for several consecutive years.  As noted above, patient's daughter conveys that the patient is downplaying the volume of his daily alcohol consumption, stating that his average alcohol consumption is more in line with 18 twelve oz beers per day.  Patient denies any interest in alcohol consumption discontinuation at this time. Most recent alcohol consumption occurred in the early afternoon of 07/19/20. Close monitoring for development of evidence of alcohol withdrawal, including close attention to trend in vital signs, as well as close monitoring of electrolytes, as described below.    Plan: counseled the patient on the importance of reduction in alcohol consumption. Consult to transition of care team placed. Close monitoring of ensuing BP and HR via routine VS. Symptoms-based CIWA protocol with prn Ativan ordered. Telemetry. Add-on serum Mg level. Check serum phosphorus level. Repeat CMP in the morning. Check INR. Add-on serum ethanol level. Banana bag x 1 ordered overnight followed by daily oral thiamine/folic acid/multivitamin supplementation, with associated start date of 07/21/2020.        #)  Paroxysmal atrial fibrillation: Documented history of such, with a single episode of atrial fibrillation that time of pneumonia complicated with with empyema in January 2019. No evidence of ensuing atrial fibrillation following resolution of this January 2019 episode, per chart review. In the setting of a CHA2DS2-VASc score of 1, there is not currently an indication for the patient to be on formally chronic anticoagulation for thromboembolic prophylaxis.  Consistent with this, the patient reports that he is on no blood thinning agents at home, including no aspirin.  Not on any AV nodal blocking medications at home.  Presenting EKG demonstrates sinus rhythm.  The patient is certainly at risk for redevelopment of atrial fibrillation, in the setting of holiday heart given his reported history of alcohol abuse.    Plan: monitor strict I's & O's and daily weights. Repeat BMP and CBC in the morning. Check serum magnesium level.  Monitor on telemetry.      #) Essential hypertension: The patient acknowledges that he is prescribed valsartan as a sole outpatient antihypertensive agent, but conveys that he has not been taking this medication at home.  Presenting systolic blood pressures noted to be in the 130s to 140s mmHg.   Plan: Close monitoring of ensuing blood pressure via routine vital signs.  Counseled the patient on the importance of improved compliance with outpatient hypertensive medication.       #) Hyperlipidemia: Documented history of such.  The patient confirms that he is prescribed atorvastatin, but but notes that he is also noncompliant in taking this medication.   Plan: Counseled patient on the importance of improved compliance with his home prescribed statin.       #) Chronic tobacco abuse: The patient reports that he is a current smoker, having smoked 1 to 1-1/2 packs/day over the last 35 to 40 years.  At the time of his diagnosis of pneumonia complicated by empyema in January 2019,  the patient conveys that he was able to temporarily stop smoking for approximately 9 to 10 months.  However, he ultimately returned to his prior smoking habits.   Plan: Counseled the patient on the importance of complete smoking discontinuation.  Nicotine patch ordered for use during this hospitalization.      DVT prophylaxis: scd's  Code Status: Full code Family Communication: The patient's case was discussed  with his daughter, who is present at bedside Disposition Plan: Per Rounding Team Consults called: Patient's case/imaging were discussed with the on-call orthopedic surgeon, Dr. Rudene Christians, who has agreed to formally consult, as further detailed above Admission status: Inpatient; med telemetry.     Of note, this patient was added by me to the following Admit List/Treatment Team: [email or web address].      PLEASE NOTE THAT DRAGON DICTATION SOFTWARE WAS USED IN THE CONSTRUCTION OF THIS NOTE.   Clarissa [facility] Pager [phone] From [place]  Otherwise, please contact night-coverage  [email or web address] Password [facility]   07/19/2020, 8:58 PM

## 2020-07-20 ENCOUNTER — Inpatient Hospital Stay: Payer: PRIVATE HEALTH INSURANCE | Admitting: Anesthesiology

## 2020-07-20 ENCOUNTER — Inpatient Hospital Stay: Payer: PRIVATE HEALTH INSURANCE

## 2020-07-20 ENCOUNTER — Encounter
Admission: EM | Disposition: A | Discharge status: Home / Self Care | Payer: Self-pay | Source: Home / Self Care | Attending: Internal Medicine

## 2020-07-20 ENCOUNTER — Encounter: Payer: Self-pay | Admitting: Internal Medicine

## 2020-07-20 DIAGNOSIS — S52502A Unspecified fracture of the lower end of left radius, initial encounter for closed fracture: Principal | ICD-10-CM

## 2020-07-20 DIAGNOSIS — S2249XA Multiple fractures of ribs, unspecified side, initial encounter for closed fracture: Secondary | ICD-10-CM

## 2020-07-20 HISTORY — PX: CARPAL TUNNEL RELEASE: SHX101

## 2020-07-20 HISTORY — PX: OPEN REDUCTION INTERNAL FIXATION (ORIF) DISTAL RADIAL FRACTURE: SHX5989

## 2020-07-20 LAB — TSH: TSH: 2.664 u[IU]/mL (ref 0.350–4.500)

## 2020-07-20 LAB — COMPREHENSIVE METABOLIC PANEL
ALT: 30 U/L (ref 0–44)
AST: 79 U/L — ABNORMAL HIGH (ref 15–41)
Albumin: 3.9 g/dL (ref 3.5–5.0)
Alkaline Phosphatase: 70 U/L (ref 38–126)
Anion gap: 11 (ref 5–15)
BUN: 7 mg/dL — ABNORMAL LOW (ref 8–23)
CO2: 24 mmol/L (ref 22–32)
Calcium: 8.6 mg/dL — ABNORMAL LOW (ref 8.9–10.3)
Chloride: 90 mmol/L — ABNORMAL LOW (ref 98–111)
Creatinine, Ser: 0.55 mg/dL — ABNORMAL LOW (ref 0.61–1.24)
GFR, Estimated: >60 mL/min (ref >60)
Glucose, Bld: 111 mg/dL — ABNORMAL HIGH (ref 70–99)
Potassium: 4.2 mmol/L (ref 3.5–5.1)
Sodium: 125 mmol/L — ABNORMAL LOW (ref 135–145)
Total Bilirubin: 0.9 mg/dL (ref 0.3–1.2)
Total Protein: 7.2 g/dL (ref 6.5–8.1)

## 2020-07-20 LAB — URINALYSIS, ROUTINE W REFLEX MICROSCOPIC
Bacteria, UA: NONE SEEN
Bilirubin Urine: NEGATIVE
Glucose, UA: NEGATIVE mg/dL
Ketones, ur: NEGATIVE mg/dL
Leukocytes,Ua: NEGATIVE
Nitrite: NEGATIVE
Protein, ur: NEGATIVE mg/dL
Specific Gravity, Urine: 1.01 (ref 1.005–1.030)
Squamous Epithelial / HPF: NONE SEEN (ref 0–5)
pH: 6 (ref 5.0–8.0)

## 2020-07-20 LAB — CBC
HCT: 38.3 % — ABNORMAL LOW (ref 39.0–52.0)
Hemoglobin: 13.5 g/dL (ref 13.0–17.0)
MCH: 33.4 pg (ref 26.0–34.0)
MCHC: 35.2 g/dL (ref 30.0–36.0)
MCV: 94.8 fL (ref 80.0–100.0)
Platelets: 301 10*3/uL (ref 150–400)
RBC: 4.04 MIL/uL — ABNORMAL LOW (ref 4.22–5.81)
RDW: 11.5 % (ref 11.5–15.5)
WBC: 14.9 10*3/uL — ABNORMAL HIGH (ref 4.0–10.5)
nRBC: 0 % (ref 0.0–0.2)

## 2020-07-20 LAB — SODIUM, URINE, RANDOM: Sodium, Ur: 55 mmol/L

## 2020-07-20 LAB — PHOSPHORUS: Phosphorus: 3.3 mg/dL (ref 2.5–4.6)

## 2020-07-20 LAB — POCT I-STAT, CHEM 8
BUN: 6 mg/dL — ABNORMAL LOW (ref 8–23)
Calcium, Ion: 1.12 mmol/L — ABNORMAL LOW (ref 1.15–1.40)
Chloride: 95 mmol/L — ABNORMAL LOW (ref 98–111)
Creatinine, Ser: 0.5 mg/dL — ABNORMAL LOW (ref 0.61–1.24)
Glucose, Bld: 96 mg/dL (ref 70–99)
HCT: 40 % (ref 39.0–52.0)
Hemoglobin: 13.6 g/dL (ref 13.0–17.0)
Potassium: 4.3 mmol/L (ref 3.5–5.1)
Sodium: 127 mmol/L — ABNORMAL LOW (ref 135–145)
TCO2: 24 mmol/L (ref 22–32)

## 2020-07-20 LAB — OSMOLALITY, URINE: Osmolality, Ur: 362 mOsm/kg (ref 300–900)

## 2020-07-20 LAB — ETHANOL: Alcohol, Ethyl (B): <10 mg/dL (ref <10)

## 2020-07-20 LAB — MAGNESIUM: Magnesium: 1.8 mg/dL (ref 1.7–2.4)

## 2020-07-20 LAB — PROTIME-INR
INR: 1 (ref 0.8–1.2)
Prothrombin Time: 13.6 seconds (ref 11.4–15.2)

## 2020-07-20 LAB — HIV ANTIBODY (ROUTINE TESTING W REFLEX): HIV Screen 4th Generation wRfx: NONREACTIVE

## 2020-07-20 LAB — OSMOLALITY: Osmolality: 258 mOsm/kg — ABNORMAL LOW (ref 275–295)

## 2020-07-20 SURGERY — OPEN REDUCTION INTERNAL FIXATION (ORIF) DISTAL RADIUS FRACTURE
Anesthesia: General | Site: Wrist | Laterality: Left

## 2020-07-20 MED ORDER — MIDAZOLAM HCL 2 MG/2ML IJ SOLN
INTRAMUSCULAR | Status: AC
  Filled 2020-07-20: qty 2

## 2020-07-20 MED ORDER — LIDOCAINE HCL (CARDIAC) PF 100 MG/5ML IV SOSY
PREFILLED_SYRINGE | INTRAVENOUS | Status: DC | PRN
  Administered 2020-07-20: 100 mg via INTRAVENOUS

## 2020-07-20 MED ORDER — SODIUM CHLORIDE 0.9 % IV SOLN
1.0000 g | Freq: Four times a day (QID) | INTRAVENOUS | Status: AC
  Administered 2020-07-20 – 2020-07-21 (×3): 1 g via INTRAVENOUS
  Filled 2020-07-20: qty 10
  Filled 2020-07-20 (×2): qty 1

## 2020-07-20 MED ORDER — PROPOFOL 10 MG/ML IV BOLUS
INTRAVENOUS | Status: DC | PRN
  Administered 2020-07-20: 100 mg via INTRAVENOUS

## 2020-07-20 MED ORDER — METOCLOPRAMIDE HCL 10 MG PO TABS
5.0000 mg | ORAL_TABLET | Freq: Three times a day (TID) | ORAL | Status: DC | PRN
Start: 2020-07-20 — End: 2020-07-21

## 2020-07-20 MED ORDER — ONDANSETRON HCL 4 MG/2ML IJ SOLN: 4.0000 mg | Freq: Four times a day (QID) | INTRAMUSCULAR | Status: DC | PRN

## 2020-07-20 MED ORDER — ONDANSETRON HCL 4 MG/2ML IJ SOLN: 4.0000 mg | Freq: Once | INTRAMUSCULAR | Status: DC | PRN

## 2020-07-20 MED ORDER — SUGAMMADEX SODIUM 200 MG/2ML IV SOLN
INTRAVENOUS | Status: DC | PRN
  Administered 2020-07-20: 125 mg via INTRAVENOUS

## 2020-07-20 MED ORDER — DOCUSATE SODIUM 100 MG PO CAPS
100.0000 mg | ORAL_CAPSULE | Freq: Two times a day (BID) | ORAL | Status: DC
  Administered 2020-07-20 – 2020-07-21 (×2): 100 mg via ORAL
  Filled 2020-07-20 (×2): qty 1

## 2020-07-20 MED ORDER — SODIUM CHLORIDE 0.9 % IV SOLN: INTRAVENOUS | Status: DC

## 2020-07-20 MED ORDER — ONDANSETRON HCL 4 MG PO TABS: 4.0000 mg | ORAL_TABLET | Freq: Four times a day (QID) | ORAL | Status: DC | PRN

## 2020-07-20 MED ORDER — ROCURONIUM BROMIDE 100 MG/10ML IV SOLN
INTRAVENOUS | Status: DC | PRN
  Administered 2020-07-20: 20 mg via INTRAVENOUS
  Administered 2020-07-20: 50 mg via INTRAVENOUS

## 2020-07-20 MED ORDER — OXYCODONE HCL 5 MG PO TABS
ORAL_TABLET | ORAL | Status: AC
  Filled 2020-07-20: qty 1

## 2020-07-20 MED ORDER — BUPIVACAINE HCL (PF) 0.5 % IJ SOLN
INTRAMUSCULAR | Status: AC
  Filled 2020-07-20: qty 30

## 2020-07-20 MED ORDER — EPHEDRINE SULFATE 50 MG/ML IJ SOLN
INTRAMUSCULAR | Status: DC | PRN
  Administered 2020-07-20 (×2): 5 mg via INTRAVENOUS
  Administered 2020-07-20: 10 mg via INTRAVENOUS

## 2020-07-20 MED ORDER — DEXAMETHASONE SODIUM PHOSPHATE 10 MG/ML IJ SOLN
INTRAMUSCULAR | Status: DC | PRN
  Administered 2020-07-20: 10 mg via INTRAVENOUS

## 2020-07-20 MED ORDER — ENOXAPARIN SODIUM 40 MG/0.4ML IJ SOSY
40.0000 mg | PREFILLED_SYRINGE | INTRAMUSCULAR | Status: DC
  Administered 2020-07-21: 40 mg via SUBCUTANEOUS
  Filled 2020-07-20: qty 0.4

## 2020-07-20 MED ORDER — OXYCODONE HCL 5 MG PO TABS
5.0000 mg | ORAL_TABLET | Freq: Once | ORAL | Status: AC | PRN
  Administered 2020-07-20: 5 mg via ORAL

## 2020-07-20 MED ORDER — ONDANSETRON HCL 4 MG/2ML IJ SOLN
INTRAMUSCULAR | Status: DC | PRN
  Administered 2020-07-20: 4 mg via INTRAVENOUS

## 2020-07-20 MED ORDER — NEOMYCIN-POLYMYXIN B GU 40-200000 IR SOLN
Status: AC
  Filled 2020-07-20: qty 1

## 2020-07-20 MED ORDER — FENTANYL CITRATE (PF) 100 MCG/2ML IJ SOLN
INTRAMUSCULAR | Status: AC
  Filled 2020-07-20: qty 2

## 2020-07-20 MED ORDER — OXYCODONE HCL 5 MG/5ML PO SOLN: 5.0000 mg | Freq: Once | ORAL | Status: AC | PRN

## 2020-07-20 MED ORDER — NEOMYCIN-POLYMYXIN B GU 40-200000 IR SOLN
Status: DC | PRN
  Administered 2020-07-20: 2 mL

## 2020-07-20 MED ORDER — PHENYLEPHRINE HCL (PRESSORS) 10 MG/ML IV SOLN
INTRAVENOUS | Status: DC | PRN
  Administered 2020-07-20: 150 ug via INTRAVENOUS
  Administered 2020-07-20: 100 ug via INTRAVENOUS
  Administered 2020-07-20: 150 ug via INTRAVENOUS
  Administered 2020-07-20: 100 ug via INTRAVENOUS
  Administered 2020-07-20: 200 ug via INTRAVENOUS
  Administered 2020-07-20 (×2): 100 ug via INTRAVENOUS

## 2020-07-20 MED ORDER — FENTANYL CITRATE (PF) 100 MCG/2ML IJ SOLN
25.0000 ug | INTRAMUSCULAR | Status: DC | PRN
  Administered 2020-07-20: 50 ug via INTRAVENOUS
  Administered 2020-07-20: 25 ug via INTRAVENOUS

## 2020-07-20 MED ORDER — FENTANYL CITRATE (PF) 100 MCG/2ML IJ SOLN
INTRAMUSCULAR | Status: AC
  Administered 2020-07-20: 25 ug via INTRAVENOUS
  Filled 2020-07-20: qty 2

## 2020-07-20 MED ORDER — CEFAZOLIN SODIUM-DEXTROSE 2-4 GM/100ML-% IV SOLN
INTRAVENOUS | Status: AC
  Filled 2020-07-20: qty 100

## 2020-07-20 MED ORDER — MIDAZOLAM HCL 2 MG/2ML IJ SOLN
INTRAMUSCULAR | Status: DC | PRN
  Administered 2020-07-20: 2 mg via INTRAVENOUS

## 2020-07-20 MED ORDER — FENTANYL CITRATE (PF) 100 MCG/2ML IJ SOLN
INTRAMUSCULAR | Status: DC | PRN
  Administered 2020-07-20: 100 ug via INTRAVENOUS

## 2020-07-20 MED ORDER — BUPIVACAINE HCL 0.5 % IJ SOLN
INTRAMUSCULAR | Status: DC | PRN
  Administered 2020-07-20: 10 mL

## 2020-07-20 MED ORDER — METOCLOPRAMIDE HCL 5 MG/ML IJ SOLN: 5.0000 mg | Freq: Three times a day (TID) | INTRAMUSCULAR | Status: DC | PRN

## 2020-07-20 MED ORDER — SODIUM CHLORIDE 0.9 % IV SOLN
1.0000 g | Freq: Once | INTRAVENOUS | Status: AC
  Administered 2020-07-20: 2 g via INTRAVENOUS
  Filled 2020-07-20: qty 1

## 2020-07-20 SURGICAL SUPPLY — 50 items
BIT DRILL 2.5 X LONG (BIT) ×2
BIT DRILL QC 2X125 (BIT) IMPLANT
BIT DRILL X LONG 2.5 (BIT) IMPLANT
BNDG ELASTIC 3X5.8 VLCR STR LF (GAUZE/BANDAGES/DRESSINGS) ×3 IMPLANT
BNDG ELASTIC 4X5.8 VLCR STR LF (GAUZE/BANDAGES/DRESSINGS) ×3 IMPLANT
CANISTER SUCT 1200ML W/VALVE (MISCELLANEOUS) ×3 IMPLANT
CHLORAPREP W/TINT 26 (MISCELLANEOUS) ×3 IMPLANT
COVER WAND RF STERILE (DRAPES) ×3 IMPLANT
CUFF TOURN SGL QUICK 18X4 (TOURNIQUET CUFF) ×1 IMPLANT
DRAPE FLUOR MINI C-ARM 54X84 (DRAPES) ×3 IMPLANT
DRILL BIT QC 2X125 (BIT) ×1
DRILL BIT X LONG 2.5 (BIT) ×1
ELECT CAUTERY NDL 2.0 MIC (NEEDLE) IMPLANT
ELECT CAUTERY NEEDLE 2.0 MIC (NEEDLE) IMPLANT
ELECT REM PT RETURN 9FT ADLT (ELECTROSURGICAL) ×3
ELECTRODE REM PT RTRN 9FT ADLT (ELECTROSURGICAL) ×2 IMPLANT
GAUZE SPONGE 4X4 12PLY STRL (GAUZE/BANDAGES/DRESSINGS) ×3 IMPLANT
GAUZE XEROFORM 1X8 LF (GAUZE/BANDAGES/DRESSINGS) ×6 IMPLANT
GLOVE SURG SYN 9.0  PF PI (GLOVE) ×1
GLOVE SURG SYN 9.0 PF PI (GLOVE) ×2 IMPLANT
GOWN SRG 2XL LVL 4 RGLN SLV (GOWNS) ×2 IMPLANT
GOWN STRL NON-REIN 2XL LVL4 (GOWNS) ×1
GOWN STRL REUS W/ TWL LRG LVL3 (GOWN DISPOSABLE) ×2 IMPLANT
GOWN STRL REUS W/TWL LRG LVL3 (GOWN DISPOSABLE) ×1
KIT TURNOVER KIT A (KITS) ×3 IMPLANT
MANIFOLD NEPTUNE II (INSTRUMENTS) ×3 IMPLANT
NDL FILTER BLUNT 18X1 1/2 (NEEDLE) ×2 IMPLANT
NEEDLE FILTER BLUNT 18X 1/2SAF (NEEDLE) ×1
NEEDLE FILTER BLUNT 18X1 1/2 (NEEDLE) ×2 IMPLANT
NS IRRIG 500ML POUR BTL (IV SOLUTION) ×3 IMPLANT
PACK EXTREMITY ARMC (MISCELLANEOUS) ×3 IMPLANT
PAD CAST CTTN 4X4 STRL (SOFTGOODS) ×4 IMPLANT
PADDING CAST COTTON 4X4 STRL (SOFTGOODS) ×2
PEG SUBCHONDRAL SMOOTH 2.0X14 (Peg) ×1 IMPLANT
PEG SUBCHONDRAL SMOOTH 2.0X16 (Peg) ×1 IMPLANT
PEG SUBCHONDRAL SMOOTH 2.0X18 (Peg) ×1 IMPLANT
PLATE SHORT 21.6X48.9 NRRW LT (Plate) ×1 IMPLANT
PLATE SHORT 21.6X48.9 NRRW RT (Plate) ×1 IMPLANT
SCALPEL PROTECTED #15 DISP (BLADE) ×6 IMPLANT
SCREW CORT 3.5X10 LNG (Screw) ×3 IMPLANT
SCREW MULTI DIRECT 16MM (Screw) ×2 IMPLANT
SCREW MULTI DIRECT 18MM (Screw) ×2 IMPLANT
SCREW MULTI DIRECT 20MM (Screw) ×2 IMPLANT
SCREW MULTI DIRECT 22MM (Screw) ×1 IMPLANT
SPLINT CAST 1 STEP 3X12 (MISCELLANEOUS) ×3 IMPLANT
SUT ETHILON 4-0 (SUTURE) ×1
SUT ETHILON 4-0 FS2 18XMFL BLK (SUTURE) ×2
SUT VICRYL 3-0 27IN (SUTURE) ×3 IMPLANT
SUTURE ETHLN 4-0 FS2 18XMF BLK (SUTURE) ×2 IMPLANT
SYR 3ML LL SCALE MARK (SYRINGE) ×3 IMPLANT

## 2020-07-20 NOTE — Plan of Care (Signed)

## 2020-07-20 NOTE — Consult Note (Signed)
Reason for Consult: Bilateral wrist fractures after a fall Referring Physician: Dr. Sharlene [name] is an 61 y.o. male.  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  He was noted in the emergency room to have a scapular body fracture as well as rib fractures and bilateral distal radius fractures.  These were reduced in the emergency room to an acceptable position but still requiring ORIF.  Past Medical History:  Diagnosis Date  . Alcohol abuse   . Anemia   . Electric injury   . HLD (hyperlipidemia)   . HTN (hypertension)   . PAF (paroxysmal atrial fibrillation) (HCC)    a. in the setting of PNA with empyema; b. CHADS2VASc => 0; c. TTE 1/19: EF of 55-60%, unable to exlcude RWMA, mitral valve with systolic bowing without prolapse, mildly dilated RV cavity size with normal RV systolic function, mildly dilated right atrium  . PNA (pneumonia)    a. 8/46: complicated by right-sided empyema which was treated with chest tube placement and subsequent bronchoscopy and right thoractomy with decortication and pleural drainage of lung abscess  . Thrombocytosis     Past Surgical History:  Procedure Laterality Date  . DECORTICATION N/A 04/03/2017   Procedure: DECORTICATION;  Surgeon: Kristopher Lewandowsky, MD;  Location: [facility] ORS;  Service: Thoracic;  Laterality: N/A;  debridement of lung abscess  . SKIN GRAFT    . THORACOTOMY Right 04/03/2017   Procedure: THORACOTOMY MAJOR;  Surgeon: Kristopher Lewandowsky, MD;  Location: [facility] ORS;  Service: Thoracic;  Laterality: Right;  Marland Kitchen VIDEO BRONCHOSCOPY N/A 04/03/2017   Procedure: PREOP BRONCHOSCOPY;  Surgeon: Kristopher Lewandowsky, MD;  Location: [facility] ORS;  Service: Thoracic;  Laterality: N/A;    Family History  Problem Relation Age of Onset  . Atrial fibrillation Mother   . Dementia Mother     Social History:  reports that he has been smoking cigarettes. He  has a 52.50 pack-year smoking history. He has never used smokeless tobacco. He reports current alcohol use. He reports that he does not use drugs.  Allergies: No Known Allergies  Medications: I have reviewed the patient's current medications.  Results for orders placed or performed during the hospital encounter of 07/19/20 (from the past 48 hour(s))  CBC with Differential     Status: Abnormal   Collection Time: 07/19/20  5:55 PM  Result Value Ref Range   WBC 12.4 (H) 4.0 - 10.5 K/uL   RBC 4.34 4.22 - 5.81 MIL/uL   Hemoglobin 14.4 13.0 - 17.0 g/dL   HCT 40.8 39.0 - 52.0 %   MCV 94.0 80.0 - 100.0 fL   MCH 33.2 26.0 - 34.0 pg   MCHC 35.3 30.0 - 36.0 g/dL   RDW 11.5 11.5 - 15.5 %   Platelets 326 150 - 400 K/uL   nRBC 0.0 0.0 - 0.2 %   Neutrophils Relative % 72 %   Neutro Abs 9.0 (H) 1.7 - 7.7 K/uL   Lymphocytes Relative 13 %   Lymphs Abs 1.7 0.7 - 4.0 K/uL   Monocytes Relative 11 %   Monocytes Absolute 1.3 (H) 0.1 - 1.0 K/uL   Eosinophils Relative 1 %   Eosinophils Absolute 0.1 0.0 - 0.5 K/uL   Basophils Relative 0 %   Basophils Absolute 0.1 0.0 - 0.1 K/uL   Immature Granulocytes 3 %   Abs Immature Granulocytes 0.33 (H) 0.00 -  0.07 K/uL    Comment: Performed at [facility], [address] [place], [place] [address]  Basic metabolic panel     Status: Abnormal   Collection Time: 07/19/20  5:55 PM  Result Value Ref Range   Sodium 129 (L) 135 - 145 mmol/L   Potassium 4.1 3.5 - 5.1 mmol/L   Chloride 95 (L) 98 - 111 mmol/L   CO2 23 22 - 32 mmol/L   Glucose, Bld 118 (H) 70 - 99 mg/dL    Comment: Glucose reference range applies only to samples taken after fasting for at least 8 hours.   BUN 8 8 - 23 mg/dL   Creatinine, Ser 1.44 0.61 - 1.24 mg/dL   Calcium 8.8 (L) 8.9 - 10.3 mg/dL   GFR, Estimated >31 >54 mL/min    Comment: (NOTE) Calculated using the CKD-EPI Creatinine Equation (2021)    Anion gap 11 5 - 15    Comment: Performed at [facility], [address] [place], [place] [address]  Magnesium     Status: None   Collection Time: 07/19/20  5:55 PM  Result Value Ref Range   Magnesium 2.0 1.7 - 2.4 mg/dL    Comment: Performed at [facility], [address], [place], [place] [address]  Resp Panel by RT-PCR (Flu A&B, Covid) Nasopharyngeal Swab     Status: None   Collection Time: 07/19/20  7:54 PM   Specimen: Nasopharyngeal Swab; Nasopharyngeal(NP) swabs in vial transport medium  Result Value Ref Range   SARS Coronavirus 2 by RT PCR NEGATIVE NEGATIVE    Comment: (NOTE) SARS-CoV-2 target nucleic acids are NOT DETECTED.  The SARS-CoV-2 RNA is generally detectable in upper respiratory specimens during the acute phase of infection. The lowest concentration of SARS-CoV-2 viral copies this assay can detect is 138 copies/mL. A negative result does not preclude SARS-Cov-2 infection and should not be used as the sole basis for treatment or other patient management decisions. A negative result may occur with  improper specimen collection/handling, submission of specimen other than nasopharyngeal swab, presence of viral mutation(s) within the areas targeted by this assay, and inadequate number of viral copies(<138 copies/mL). A negative result must be combined with clinical observations, patient history, and epidemiological information. The expected result is Negative.  Fact Sheet for Patients:  BloggerCourse.com  Fact Sheet for Healthcare Providers:  SeriousBroker.it  This test is no t yet approved or cleared by the [place] FDA and  has been authorized for detection and/or diagnosis of SARS-CoV-2 by FDA under an Emergency Use Authorization (EUA). This EUA will remain  in effect (meaning this test can be used) for the duration of the COVID-19 declaration under Section 564(b)(1) of the Act, 21 U.S.C.section 360bbb-3(b)(1), unless the authorization is terminated  or revoked  sooner.       Influenza A by PCR NEGATIVE NEGATIVE   Influenza B by PCR NEGATIVE NEGATIVE    Comment: (NOTE) The Xpert Xpress SARS-CoV-2/FLU/RSV plus assay is intended as an aid in the diagnosis of influenza from Nasopharyngeal swab specimens and should not be used as a sole basis for treatment. Nasal washings and aspirates are unacceptable for Xpert Xpress SARS-CoV-2/FLU/RSV testing.  Fact Sheet for Patients: BloggerCourse.com  Fact Sheet for Healthcare Providers: SeriousBroker.it  This test is not yet approved or cleared by the [place] FDA and has been authorized for detection and/or diagnosis of SARS-CoV-2 by FDA under an Emergency Use Authorization (EUA). This EUA will remain in effect (meaning this test  can be used) for the duration of the COVID-19 declaration under Section 564(b)(1) of the Act, 21 U.S.C. section 360bbb-3(b)(1), unless the authorization is terminated or revoked.  Performed at [facility], [address] [place], [place] [address]   Urinalysis, Routine w reflex microscopic Urine, Clean Catch     Status: Abnormal   Collection Time: 07/19/20 11:10 PM  Result Value Ref Range   Color, Urine YELLOW (A) YELLOW   APPearance CLEAR (A) CLEAR   Specific Gravity, Urine 1.010 1.005 - 1.030   pH 6.0 5.0 - 8.0   Glucose, UA NEGATIVE NEGATIVE mg/dL   Hgb urine dipstick MODERATE (A) NEGATIVE   Bilirubin Urine NEGATIVE NEGATIVE   Ketones, ur NEGATIVE NEGATIVE mg/dL   Protein, ur NEGATIVE NEGATIVE mg/dL   Nitrite NEGATIVE NEGATIVE   Leukocytes,Ua NEGATIVE NEGATIVE   RBC / HPF 0-5 0 - 5 RBC/hpf   WBC, UA 0-5 0 - 5 WBC/hpf   Bacteria, UA NONE SEEN NONE SEEN   Squamous Epithelial / LPF NONE SEEN 0 - 5   Mucus PRESENT     Comment: Performed at [facility], [address] [place], [place] [address]  Osmolality, urine     Status: None   Collection Time: 07/19/20 11:10 PM  Result Value Ref  Range   Osmolality, Ur 362 300 - 900 mOsm/kg    Comment: Performed at [facility], [address] [place], [place] [address]  Sodium, urine, random     Status: None   Collection Time: 07/19/20 11:10 PM  Result Value Ref Range   Sodium, Ur 55 mmol/L    Comment: Performed at [facility], [address] [place], [place] [address]  Magnesium     Status: None   Collection Time: 07/20/20 12:39 AM  Result Value Ref Range   Magnesium 1.8 1.7 - 2.4 mg/dL    Comment: Performed at [facility], [address] [place], [place] [address]  Phosphorus     Status: None   Collection Time: 07/20/20 12:39 AM  Result Value Ref Range   Phosphorus 3.3 2.5 - 4.6 mg/dL    Comment: Performed at [facility], [address] [place], [place] [address]  Comprehensive metabolic panel     Status: Abnormal   Collection Time: 07/20/20 12:39 AM  Result Value Ref Range   Sodium 125 (L) 135 - 145 mmol/L   Potassium 4.2 3.5 - 5.1 mmol/L   Chloride 90 (L) 98 - 111 mmol/L   CO2 24 22 - 32 mmol/L   Glucose, Bld 111 (H) 70 - 99 mg/dL    Comment: Glucose reference range applies only to samples taken after fasting for at least 8 hours.   BUN 7 (L) 8 - 23 mg/dL   Creatinine, Ser 7.49 (L) 0.61 - 1.24 mg/dL   Calcium 8.6 (L) 8.9 - 10.3 mg/dL   Total Protein 7.2 6.5 - 8.1 g/dL   Albumin 3.9 3.5 - 5.0 g/dL   AST 79 (H) 15 - 41 U/L   ALT 30 0 - 44 U/L   Alkaline Phosphatase 70 38 - 126 U/L   Total Bilirubin 0.9 0.3 - 1.2 mg/dL   GFR, Estimated >44 >96 mL/min    Comment: (NOTE) Calculated using the CKD-EPI Creatinine Equation (2021)    Anion gap 11 5 - 15    Comment: Performed at [facility], [address] [place], [place] [address]  CBC     Status: Abnormal   Collection  Time: 07/20/20 12:39 AM  Result Value Ref Range   WBC 14.9 (H) 4.0 - 10.5 K/uL   RBC 4.04 (L) 4.22 - 5.81 MIL/uL   Hemoglobin 13.5 13.0 - 17.0 g/dL   HCT 38.3 (L) 39.0 - 52.0 %   MCV 94.8  80.0 - 100.0 fL   MCH 33.4 26.0 - 34.0 pg   MCHC 35.2 30.0 - 36.0 g/dL   RDW 11.5 11.5 - 15.5 %   Platelets 301 150 - 400 K/uL   nRBC 0.0 0.0 - 0.2 %    Comment: Performed at [facility], [address] [place], [place] [address]  Protime-INR     Status: None   Collection Time: 07/20/20 12:39 AM  Result Value Ref Range   Prothrombin Time 13.6 11.4 - 15.2 seconds   INR 1.0 0.8 - 1.2    Comment: (NOTE) INR goal varies based on device and disease states. Performed at [facility], [address] [place], [place] [address]   Ethanol     Status: None   Collection Time: 07/20/20 12:39 AM  Result Value Ref Range   Alcohol, Ethyl (B) <10 <10 mg/dL    Comment: (NOTE) Lowest detectable limit for serum alcohol is 10 mg/dL.  For medical purposes only. Performed at [facility], [address] [place], [place] [address]   TSH     Status: None   Collection Time: 07/20/20 12:39 AM  Result Value Ref Range   TSH 2.664 0.350 - 4.500 uIU/mL    Comment: Performed by a 3rd Generation assay with a functional sensitivity of <=0.01 uIU/mL. Performed at [facility], [address] [place], [place] [address]   Osmolality     Status: Abnormal   Collection Time: 07/20/20 12:39 AM  Result Value Ref Range   Osmolality 258 (L) 275 - 295 mOsm/kg    Comment: Performed at [facility], [address] [place], [place] [address]    DG Shoulder Right  Result Date: 07/19/2020 CLINICAL DATA:  Pain following fall EXAM: RIGHT SHOULDER - 2+ VIEW COMPARISON:  None. FINDINGS: Frontal, oblique, and Y scapular images obtained. There is an apparent fracture in the mid scapula region with displaced fracture fragments in this area. No other acute fracture. No dislocation. There is mild generalized joint space narrowing. No erosive change. Visualized right lung clear. IMPRESSION: Fracture mid scapular region with displacement of fracture fragments, best seen in the  mid scapular region medially. No other fractures. Mild generalized joint space narrowing. No erosion. Electronically Signed   By: Lowella Grip III M.D.   On: 07/19/2020 18:13   DG Wrist Complete Left  Result Date: 07/19/2020 CLINICAL DATA:  Post reduction EXAM: LEFT WRIST - COMPLETE 3+ VIEW COMPARISON:  07/19/2020 FINDINGS: Casting material limits bone detail. Acute comminuted distal radius fracture with residual mild dorsal angulation and about 1/4 shaft diameter dorsal displacement but decreased compared to prior. Round metallic density at the level of the metacarpals redemonstrated IMPRESSION: Decreased displacement and angulation of comminuted distal radius fracture Electronically Signed   By: Donavan Foil M.D.   On: 07/19/2020 21:15   DG Wrist Complete Left  Result Date: 07/19/2020 CLINICAL DATA:  Pain following fall EXAM: LEFT WRIST - COMPLETE 3+ VIEW COMPARISON:  None. FINDINGS: Frontal, oblique, lateral, and ulnar deviation scaphoid images were obtained. There is a comminuted fracture of the distal radial metaphysis with dorsal displacement and angulation of the distal fracture fragment. There is approximately 2 cm of overriding  of bony fragments. No other fracture. No dislocation. Mild osteoarthritic change in the first carpal-metacarpal joint. There is a metallic foreign body located volar between the third and fourth distal metacarpals. IMPRESSION: Comminuted fracture distal radial metaphysis with dorsal displacement and angulation distally with approximately 2 cm of overriding of fracture fragments. No other fracture appreciable. No dislocation. Rounded metallic foreign body volar to the distal third and fourth metacarpals. Electronically Signed   By: Bretta Bang III M.D.   On: 07/19/2020 18:11   DG Wrist Complete Right  Result Date: 07/19/2020 CLINICAL DATA:  Post reduction EXAM: RIGHT WRIST - COMPLETE 3+ VIEW COMPARISON:  07/19/2020 FINDINGS: Casting material limits bone detail.  Acute comminuted intra-articular distal radius fracture with residual dorsal angulation and displacement without significant change in alignment. IMPRESSION: No great change in alignment of acute comminuted distal radius fracture with dorsal angulation and displacement Electronically Signed   By: Jasmine Pang M.D.   On: 07/19/2020 21:13   DG Wrist Complete Right  Result Date: 07/19/2020 CLINICAL DATA:  Pain following fall EXAM: RIGHT WRIST - COMPLETE 3+ VIEW COMPARISON:  None. FINDINGS: Frontal, oblique, lateral, and ulnar deviation scaphoid images were obtained. There is a comminuted fracture of the distal radial metaphysis with dorsal angulation distally and impaction at the fracture site. No other fracture. No dislocation. Evidence of old trauma with remodeling fifth metacarpal. No joint space narrowing or erosion. IMPRESSION: Comminuted fracture distal radial metaphysis with dorsal angulation distally and impaction at the fracture site. Fracture extends into the medial aspect of the radiocarpal joint. No other acute fracture. No dislocation. Remodeling fifth metacarpal consistent with old trauma in this area. No appreciable joint space narrowing. Electronically Signed   By: Bretta Bang III M.D.   On: 07/19/2020 18:09   CT Head Wo Contrast  Result Date: 07/19/2020 CLINICAL DATA:  Larey Seat off roof EXAM: CT HEAD WITHOUT CONTRAST CT CERVICAL SPINE WITHOUT CONTRAST TECHNIQUE: Multidetector CT imaging of the head and cervical spine was performed following the standard protocol without intravenous contrast. Multiplanar CT image reconstructions of the cervical spine were also generated. COMPARISON:  None. FINDINGS: CT HEAD FINDINGS Brain: No acute territorial infarction, hemorrhage or intracranial mass is visualized. The ventricles are nonenlarged. Vascular: No hyperdense vessels.  Carotid vascular calcification Skull: Normal. Negative for fracture or focal lesion. Sinuses/Orbits: Fluid levels in the  maxillary sinuses. Mucosal thickening in the ethmoid and frontal sinuses Other: None CT CERVICAL SPINE FINDINGS Alignment: Straightening of the cervical spine. No subluxation. Facet alignment is within normal limits. Skull base and vertebrae: No acute fracture. No primary bone lesion or focal pathologic process. Soft tissues and spinal canal: No prevertebral fluid or swelling. No visible canal hematoma. Disc levels: Moderate disc space narrowing and degenerative change at C4-C5, C5-C6 and C6-C7. Mild facet degenerative changes. Bilateral foraminal narrowing at these levels. Upper chest: Lung apices show nodular scarring and blebs. Acute nondisplaced right second and third posteromedial rib fractures. Other: None IMPRESSION: 1. No CT evidence for acute intracranial abnormality.  Sinusitis 2. Straightening of the cervical spine with degenerative change. No acute osseous abnormality 3. Acute appearing nondisplaced right second and third posteromedial rib fractures Electronically Signed   By: Jasmine Pang M.D.   On: 07/19/2020 18:57   CT Cervical Spine Wo Contrast  Result Date: 07/19/2020 CLINICAL DATA:  Larey Seat off roof EXAM: CT HEAD WITHOUT CONTRAST CT CERVICAL SPINE WITHOUT CONTRAST TECHNIQUE: Multidetector CT imaging of the head and cervical spine was performed following the standard protocol without intravenous  contrast. Multiplanar CT image reconstructions of the cervical spine were also generated. COMPARISON:  None. FINDINGS: CT HEAD FINDINGS Brain: No acute territorial infarction, hemorrhage or intracranial mass is visualized. The ventricles are nonenlarged. Vascular: No hyperdense vessels.  Carotid vascular calcification Skull: Normal. Negative for fracture or focal lesion. Sinuses/Orbits: Fluid levels in the maxillary sinuses. Mucosal thickening in the ethmoid and frontal sinuses Other: None CT CERVICAL SPINE FINDINGS Alignment: Straightening of the cervical spine. No subluxation. Facet alignment is within  normal limits. Skull base and vertebrae: No acute fracture. No primary bone lesion or focal pathologic process. Soft tissues and spinal canal: No prevertebral fluid or swelling. No visible canal hematoma. Disc levels: Moderate disc space narrowing and degenerative change at C4-C5, C5-C6 and C6-C7. Mild facet degenerative changes. Bilateral foraminal narrowing at these levels. Upper chest: Lung apices show nodular scarring and blebs. Acute nondisplaced right second and third posteromedial rib fractures. Other: None IMPRESSION: 1. No CT evidence for acute intracranial abnormality.  Sinusitis 2. Straightening of the cervical spine with degenerative change. No acute osseous abnormality 3. Acute appearing nondisplaced right second and third posteromedial rib fractures Electronically Signed   By: Donavan Foil M.D.   On: 07/19/2020 18:57   CT CHEST ABDOMEN PELVIS WO CONTRAST  Result Date: 07/19/2020 CLINICAL DATA:  Fall from single story roof with dizziness, initial encounter EXAM: CT CHEST, ABDOMEN AND PELVIS WITHOUT CONTRAST TECHNIQUE: Multidetector CT imaging of the chest, abdomen and pelvis was performed following the standard protocol without IV contrast. COMPARISON:  03/30/2017 FINDINGS: CT CHEST FINDINGS Cardiovascular: Thoracic aorta demonstrates atherosclerotic calcification as well as aneurysmal dilatation to 4 cm in the ascending portion. Pulmonary artery as visualized appears within normal limits. No cardiac enlargement is seen. Heavy coronary calcifications are noted. Mediastinum/Nodes: Thoracic inlet is within normal limits. No sizable hilar or mediastinal adenopathy is noted. The esophagus as visualized is within normal limits. Lungs/Pleura: Lungs are well aerated bilaterally. Mild mucous plugging is noted in the left lung base. Mild apical scarring is noted. Previously seen chest tube and pneumothorax on the right have resolved. No sizable parenchymal nodules are noted. Mild scarring on the right is  seen. Musculoskeletal: Degenerative changes of the thoracic spine are noted. There is a comminuted fracture of the scapular body on the right extending inferiorly to the scapular tip. Mildly displaced fracture of the right fifth rib anterior laterally is noted. Fractures of the sixth and seventh ribs are noted laterally without displacement. Undisplaced right seventh rib fracture is noted centrally adjacent to the costovertebral margin. Old healed rib fractures are noted on the right as well. CT ABDOMEN PELVIS FINDINGS Hepatobiliary: No focal liver abnormality is seen. No gallstones, gallbladder wall thickening, or biliary dilatation. Pancreas: Unremarkable. No pancreatic ductal dilatation or surrounding inflammatory changes. Spleen: Normal in size without focal abnormality. Adrenals/Urinary Tract: Adrenal glands are within normal limits. Kidneys demonstrate no renal calculi or obstructive changes. The bladder is well distended. Stomach/Bowel: Mild diverticular change of the colon is noted without evidence of diverticulitis. No obstructive or inflammatory changes are seen. The appendix is within normal limits. Small bowel and stomach are unremarkable. Vascular/Lymphatic: Aortic atherosclerosis. No enlarged abdominal or pelvic lymph nodes. Reproductive: Prostate is unremarkable. Other: No abdominal wall hernia or abnormality. No abdominopelvic ascites. Musculoskeletal: No compression deformities are noted. IMPRESSION: Multiple right-sided rib fractures without complicating factors. Right scapular body fracture without significant hematoma. 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 Recommend annual imaging followup by CTA or MRA. This recommendation follows  2010 ACCF/AHA/AATS/ACR/ASA/SCA/SCAI/SIR/STS/SVM Guidelines for the Diagnosis and Management of Patients with Thoracic Aortic Disease. Circulation. 2010; 121: L465-K354. Aortic aneurysm NOS (ICD10-I71.9) Diverticulosis without diverticulitis. Electronically  Signed   By: Inez Catalina M.D.   On: 07/19/2020 18:55    Review of Systems Blood pressure (!) 176/90, pulse 66, temperature 97.8 F (36.6 C), temperature source Oral, resp. rate 19, height 6' (1.829 m), weight 62.4 kg, SpO2 98 %. Physical Exam He has tingling to the fingers thumb index and middle of the left hand intact sensation on the right.  His arms are in splints and minimal clinical deformity present.  His right shoulder is tender and has swelling.  Range of motion not assessed secondary to scapular fracture. Assessment/Plan: Bilateral displaced distal radius fractures with median nerve compromise on the left he has a history of prior right carpal tunnel release recommendation is for open reduction internal fixation bilateral wrist with additional left carpal tunnel release.  I discussed risk benefits possible complications with the patient in particular concerned about potential pulmonary complications from the scapular and rib fractures.  Kristopher [name] 07/20/2020, 6:51 AM

## 2020-07-20 NOTE — Transfer of Care (Signed)
Immediate Anesthesia Transfer of Care Note  Patient: Kristopher [name]  Procedure(s) Performed: OPEN REDUCTION INTERNAL FIXATION (ORIF) DISTAL RADIAL FRACTURE (Bilateral Wrist) CARPAL TUNNEL RELEASE (Left Wrist)  Patient Location: PACU  Anesthesia Type:General  Level of Consciousness: awake, alert  and oriented  Airway & Oxygen Therapy: Patient Spontanous Breathing and Patient connected to face mask oxygen  Post-op Assessment: Post -op Vital signs reviewed and stable  Post vital signs: stable  Last Vitals:  Vitals Value Taken Time  BP 138/81   Temp 97.14f   Pulse 93 07/20/20 1954  Resp 22 07/20/20 1954  SpO2 94 % 07/20/20 1954  Vitals shown include unvalidated device data.  Last Pain:  Vitals:   07/20/20 1543  TempSrc: Temporal  PainSc: 4       Patients Stated Pain Goal: 0 (23/30/07 6226)  Complications: No complications documented.

## 2020-07-20 NOTE — Progress Notes (Signed)
[facility] [place] NAME: Kristopher [name]    MR#:  [id number]  DATE OF BIRTH:  11/25/1960  SUBJECTIVE:  CHIEF COMPLAINT:   Chief Complaint  Patient presents with  . Fall  Reporting significant pain in his both upper extremities, pain medicine seem to help.  Waiting for surgery today REVIEW OF SYSTEMS:  Review of Systems  Constitutional: Negative for diaphoresis, fever, malaise/fatigue and weight loss.  HENT: Negative for ear discharge, ear pain, hearing loss, nosebleeds, sore throat and tinnitus.   Eyes: Negative for blurred vision and pain.  Respiratory: Negative for cough, hemoptysis, shortness of breath and wheezing.   Cardiovascular: Negative for chest pain, palpitations, orthopnea and leg swelling.  Gastrointestinal: Negative for abdominal pain, blood in stool, constipation, diarrhea, heartburn, nausea and vomiting.  Genitourinary: Negative for dysuria, frequency and urgency.  Musculoskeletal: Positive for falls and joint pain. Negative for back pain and myalgias.  Skin: Negative for itching and rash.  Neurological: Negative for dizziness, tingling, tremors, focal weakness, seizures, weakness and headaches.  Psychiatric/Behavioral: Negative for depression. The patient is not nervous/anxious.    DRUG ALLERGIES:  No Known Allergies VITALS:  Blood pressure (!) 168/95, pulse 67, temperature 97.8 F (36.6 C), temperature source Oral, resp. rate 19, height 6' (1.829 m), weight 62.4 kg, SpO2 94 %. PHYSICAL EXAMINATION:  Physical Exam  60 year old male lying in the bed in some pain Eyes pupils equal round reactive to light and accommodation Lungs clear to auscultation bilaterally, no wheezing rales rhonchi or crepitation Cardiovascular S1-S2 normal.  No murmur rales or gallop Abdomen soft, benign Neuro alert and oriented Extremities/chest his both arms are in splints.  He has tenderness of his right shoulder area along with swelling also has  tenderness on his chest wall due to refracture LABORATORY PANEL:  Male CBC Recent Labs  Lab 07/20/20 0039  WBC 14.9*  HGB 13.5  HCT 38.3*  PLT 301   ------------------------------------------------------------------------------------------------------------------ Chemistries  Recent Labs  Lab 07/20/20 0039  NA 125*  K 4.2  CL 90*  CO2 24  GLUCOSE 111*  BUN 7*  CREATININE 0.55*  CALCIUM 8.6*  MG 1.8  AST 79*  ALT 30  ALKPHOS 70  BILITOT 0.9   RADIOLOGY:  DG Shoulder Right  Result Date: 07/19/2020 CLINICAL DATA:  Pain following fall EXAM: RIGHT SHOULDER - 2+ VIEW COMPARISON:  None. FINDINGS: Frontal, oblique, and Y scapular images obtained. There is an apparent fracture in the mid scapula region with displaced fracture fragments in this area. No other acute fracture. No dislocation. There is mild generalized joint space narrowing. No erosive change. Visualized right lung clear. IMPRESSION: Fracture mid scapular region with displacement of fracture fragments, best seen in the mid scapular region medially. No other fractures. Mild generalized joint space narrowing. No erosion. Electronically Signed   By: Lowella Grip III M.D.   On: 07/19/2020 18:13   DG Wrist Complete Left  Result Date: 07/19/2020 CLINICAL DATA:  Post reduction EXAM: LEFT WRIST - COMPLETE 3+ VIEW COMPARISON:  07/19/2020 FINDINGS: Casting material limits bone detail. Acute comminuted distal radius fracture with residual mild dorsal angulation and about 1/4 shaft diameter dorsal displacement but decreased compared to prior. Round metallic density at the level of the metacarpals redemonstrated IMPRESSION: Decreased displacement and angulation of comminuted distal radius fracture Electronically Signed   By: Donavan Foil M.D.   On: 07/19/2020 21:15   DG Wrist Complete Left  Result Date: 07/19/2020  CLINICAL DATA:  Pain following fall EXAM: LEFT WRIST - COMPLETE 3+ VIEW COMPARISON:  None. FINDINGS: Frontal,  oblique, lateral, and ulnar deviation scaphoid images were obtained. There is a comminuted fracture of the distal radial metaphysis with dorsal displacement and angulation of the distal fracture fragment. There is approximately 2 cm of overriding of bony fragments. No other fracture. No dislocation. Mild osteoarthritic change in the first carpal-metacarpal joint. There is a metallic foreign body located volar between the third and fourth distal metacarpals. IMPRESSION: Comminuted fracture distal radial metaphysis with dorsal displacement and angulation distally with approximately 2 cm of overriding of fracture fragments. No other fracture appreciable. No dislocation. Rounded metallic foreign body volar to the distal third and fourth metacarpals. Electronically Signed   By: Lowella Grip III M.D.   On: 07/19/2020 18:11   DG Wrist Complete Right  Result Date: 07/19/2020 CLINICAL DATA:  Post reduction EXAM: RIGHT WRIST - COMPLETE 3+ VIEW COMPARISON:  07/19/2020 FINDINGS: Casting material limits bone detail. Acute comminuted intra-articular distal radius fracture with residual dorsal angulation and displacement without significant change in alignment. IMPRESSION: No great change in alignment of acute comminuted distal radius fracture with dorsal angulation and displacement Electronically Signed   By: Donavan Foil M.D.   On: 07/19/2020 21:13   DG Wrist Complete Right  Result Date: 07/19/2020 CLINICAL DATA:  Pain following fall EXAM: RIGHT WRIST - COMPLETE 3+ VIEW COMPARISON:  None. FINDINGS: Frontal, oblique, lateral, and ulnar deviation scaphoid images were obtained. There is a comminuted fracture of the distal radial metaphysis with dorsal angulation distally and impaction at the fracture site. No other fracture. No dislocation. Evidence of old trauma with remodeling fifth metacarpal. No joint space narrowing or erosion. IMPRESSION: Comminuted fracture distal radial metaphysis with dorsal angulation  distally and impaction at the fracture site. Fracture extends into the medial aspect of the radiocarpal joint. No other acute fracture. No dislocation. Remodeling fifth metacarpal consistent with old trauma in this area. No appreciable joint space narrowing. Electronically Signed   By: Lowella Grip III M.D.   On: 07/19/2020 18:09   CT Head Wo Contrast  Result Date: 07/19/2020 CLINICAL DATA:  [place] off roof EXAM: CT HEAD WITHOUT CONTRAST CT CERVICAL SPINE WITHOUT CONTRAST TECHNIQUE: Multidetector CT imaging of the head and cervical spine was performed following the standard protocol without intravenous contrast. Multiplanar CT image reconstructions of the cervical spine were also generated. COMPARISON:  None. FINDINGS: CT HEAD FINDINGS Brain: No acute territorial infarction, hemorrhage or intracranial mass is visualized. The ventricles are nonenlarged. Vascular: No hyperdense vessels.  Carotid vascular calcification Skull: Normal. Negative for fracture or focal lesion. Sinuses/Orbits: Fluid levels in the maxillary sinuses. Mucosal thickening in the ethmoid and frontal sinuses Other: None CT CERVICAL SPINE FINDINGS Alignment: Straightening of the cervical spine. No subluxation. Facet alignment is within normal limits. Skull base and vertebrae: No acute fracture. No primary bone lesion or focal pathologic process. Soft tissues and spinal canal: No prevertebral fluid or swelling. No visible canal hematoma. Disc levels: Moderate disc space narrowing and degenerative change at C4-C5, C5-C6 and C6-C7. Mild facet degenerative changes. Bilateral foraminal narrowing at these levels. Upper chest: Lung apices show nodular scarring and blebs. Acute nondisplaced right second and third posteromedial rib fractures. Other: None IMPRESSION: 1. No CT evidence for acute intracranial abnormality.  Sinusitis 2. Straightening of the cervical spine with degenerative change. No acute osseous abnormality 3. Acute appearing  nondisplaced right second and third posteromedial rib fractures Electronically Signed  By: Donavan Foil M.D.   On: 07/19/2020 18:57   CT Cervical Spine Wo Contrast  Result Date: 07/19/2020 CLINICAL DATA:  [place] off roof EXAM: CT HEAD WITHOUT CONTRAST CT CERVICAL SPINE WITHOUT CONTRAST TECHNIQUE: Multidetector CT imaging of the head and cervical spine was performed following the standard protocol without intravenous contrast. Multiplanar CT image reconstructions of the cervical spine were also generated. COMPARISON:  None. FINDINGS: CT HEAD FINDINGS Brain: No acute territorial infarction, hemorrhage or intracranial mass is visualized. The ventricles are nonenlarged. Vascular: No hyperdense vessels.  Carotid vascular calcification Skull: Normal. Negative for fracture or focal lesion. Sinuses/Orbits: Fluid levels in the maxillary sinuses. Mucosal thickening in the ethmoid and frontal sinuses Other: None CT CERVICAL SPINE FINDINGS Alignment: Straightening of the cervical spine. No subluxation. Facet alignment is within normal limits. Skull base and vertebrae: No acute fracture. No primary bone lesion or focal pathologic process. Soft tissues and spinal canal: No prevertebral fluid or swelling. No visible canal hematoma. Disc levels: Moderate disc space narrowing and degenerative change at C4-C5, C5-C6 and C6-C7. Mild facet degenerative changes. Bilateral foraminal narrowing at these levels. Upper chest: Lung apices show nodular scarring and blebs. Acute nondisplaced right second and third posteromedial rib fractures. Other: None IMPRESSION: 1. No CT evidence for acute intracranial abnormality.  Sinusitis 2. Straightening of the cervical spine with degenerative change. No acute osseous abnormality 3. Acute appearing nondisplaced right second and third posteromedial rib fractures Electronically Signed   By: Donavan Foil M.D.   On: 07/19/2020 18:57   CT CHEST ABDOMEN PELVIS WO CONTRAST  Result Date:  07/19/2020 CLINICAL DATA:  Fall from single story roof with dizziness, initial encounter EXAM: CT CHEST, ABDOMEN AND PELVIS WITHOUT CONTRAST TECHNIQUE: Multidetector CT imaging of the chest, abdomen and pelvis was performed following the standard protocol without IV contrast. COMPARISON:  03/30/2017 FINDINGS: CT CHEST FINDINGS Cardiovascular: Thoracic aorta demonstrates atherosclerotic calcification as well as aneurysmal dilatation to 4 cm in the ascending portion. Pulmonary artery as visualized appears within normal limits. No cardiac enlargement is seen. Heavy coronary calcifications are noted. Mediastinum/Nodes: Thoracic inlet is within normal limits. No sizable hilar or mediastinal adenopathy is noted. The esophagus as visualized is within normal limits. Lungs/Pleura: Lungs are well aerated bilaterally. Mild mucous plugging is noted in the left lung base. Mild apical scarring is noted. Previously seen chest tube and pneumothorax on the right have resolved. No sizable parenchymal nodules are noted. Mild scarring on the right is seen. Musculoskeletal: Degenerative changes of the thoracic spine are noted. There is a comminuted fracture of the scapular body on the right extending inferiorly to the scapular tip. Mildly displaced fracture of the right fifth rib anterior laterally is noted. Fractures of the sixth and seventh ribs are noted laterally without displacement. Undisplaced right seventh rib fracture is noted centrally adjacent to the costovertebral margin. Old healed rib fractures are noted on the right as well. CT ABDOMEN PELVIS FINDINGS Hepatobiliary: No focal liver abnormality is seen. No gallstones, gallbladder wall thickening, or biliary dilatation. Pancreas: Unremarkable. No pancreatic ductal dilatation or surrounding inflammatory changes. Spleen: Normal in size without focal abnormality. Adrenals/Urinary Tract: Adrenal glands are within normal limits. Kidneys demonstrate no renal calculi or obstructive  changes. The bladder is well distended. Stomach/Bowel: Mild diverticular change of the colon is noted without evidence of diverticulitis. No obstructive or inflammatory changes are seen. The appendix is within normal limits. Small bowel and stomach are unremarkable. Vascular/Lymphatic: Aortic atherosclerosis. No enlarged abdominal or pelvic lymph nodes.  Reproductive: Prostate is unremarkable. Other: No abdominal wall hernia or abnormality. No abdominopelvic ascites. Musculoskeletal: No compression deformities are noted. IMPRESSION: Multiple right-sided rib fractures without complicating factors. Right scapular body fracture without significant hematoma. 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 Recommend annual imaging followup by CTA or MRA. This recommendation follows 2010 ACCF/AHA/AATS/ACR/ASA/SCA/SCAI/SIR/STS/SVM Guidelines for the Diagnosis and Management of Patients with Thoracic Aortic Disease. Circulation. 2010; [id number]. Aortic aneurysm NOS (ICD10-I71.9) Diverticulosis without diverticulitis. Electronically Signed   By: Inez Catalina M.D.   On: 07/19/2020 18:55   ASSESSMENT AND PLAN:  LUNDY BALDERAMA is a 60 y.o. male with medical history significant for paroxysmal atrial fibrillation for which he is not chronically anticoagulated, pneumonia complicated by empyema in January 2019, chronic alcohol abuse is admitted with multiple acute right-sided rib fractures and bilateral distal radius fractures  status post fall off of a roof.   #)Multiple consecutive right-sided rib fractures and distal radius fracture, Right scapular body fracture: Following fall off the first story roof earlier today without loss of consciousness, presenting imaging shows evidence of nondisplaced fractures of the right sixth and seventh ribs as well as mildly displaced fracture of the right fifth rib, without any evidence of associated hematoma or pneumothorax.   -Orthopedic Dr. Rudene Christians planning on surgery later today for  bilateral distal radius fracture with consideration for additional left carpal tunnel release.  #) Presyncope: Likely due to orthostatic hypotension in the setting of dehydration given the patient's report of very limited consumption of water wall working in greater than 80 degree heat in the direct sun, while engaging in diuresis via alcohol consumption.  -IV hydration for now and check orthostatics  #) Ascending aorta aneurysm: Today's CT imaging demonstrated mild dilation of the ascending aorta at 4 cm without any dissection. Recommend annual imaging follow-up per the recommendations of radiology  #) Acute hypoosmolar hyponatremia: serum sodium 129->125, likely from beer drinkers potomania given report of daily consumption of 6-18 twelve ounce beers on a daily basis dating back over the last several years.  There may also be a hypovolemic contribution from dehydration as a consequence working outside over the course of the last day in 80 degree weather while in the direct sun, while drinking very limited quantities of water.  Continue IV fluids for now-cannot rule out SIADH at this time.  #) Chronic Alcohol Abuse: Monitor while on CIWA He was counseled on alcohol cessation  #) Paroxysmal atrial fibrillation: Currently in sinus rhythm.  No need for anticoagulation.  Monitor on telemetry  #) Essential hypertension: Monitor for now, uncontrolled hypertension is likely due to pain issue  #) Hyperlipidemia: Patient is not compliant with statin that he was prescribed  #) Chronic tobacco abuse: The patient reports that he is a current smoker, having smoked 1 to 1-1/2 packs/day over the last 35 to 40 years.  At the time of his diagnosis of pneumonia complicated by empyema in January 2019, the patient conveys that he was able to temporarily stop smoking for approximately 9 to 10 months.  However, he ultimately returned to his prior smoking habits.  -He was counseled for tobacco cessation by  admitting physician and nicotine patch ordered  Protein calorie malnutrition Body mass index is 18.65 kg/m.  Net IO Since Admission: 469.71 mL [07/20/20 1050]      Status is: Inpatient  Remains inpatient appropriate because:Ongoing active pain requiring inpatient pain management   Dispo: The patient is from: Home  Anticipated d/c is to: Home              Patient currently is not medically stable to d/c.   Difficult to place patient No       DVT prophylaxis:       SCDs Start: 07/19/20 2050     Family Communication: discussed with patient   All the records are reviewed and case discussed with Care Management/Social Worker. Management plans discussed with the patient, nursing and they are in agreement.  CODE STATUS: Full Code Level of care: Med-Surg  TOTAL TIME TAKING CARE OF THIS PATIENT: 35 minutes.   More than 50% of the time was spent in counseling/coordination of care: YES  POSSIBLE D/C IN 2-3 DAYS, DEPENDING ON CLINICAL CONDITION.   Max Sane M.D on 07/20/2020 at 10:50 AM  [facility]   CC: Primary care physician; Steele Sizer, MD  Note: This dictation was prepared with Dragon dictation along with smaller phrase technology. Any transcriptional errors that result from this process are unintentional.

## 2020-07-20 NOTE — Anesthesia Preprocedure Evaluation (Signed)
Anesthesia Evaluation  Patient identified by MRN, date of birth, ID band Patient awake  General Assessment Comment:Hyponatremia attributed to beer potomania. Has plateaued and is now starting to rise back up. Today 127. Pt denies any mental status changes  Reviewed: Allergy & Precautions, NPO status , Patient's Chart, lab work & pertinent test results  History of Anesthesia Complications Negative for: history of anesthetic complications  Airway Mallampati: II  TM Distance: >3 FB Neck ROM: Full    Dental  (+) Upper Dentures, Lower Dentures   Pulmonary neg sleep apnea, pneumonia, unresolved, neg COPD, Current Smoker and Patient abstained from smoking.,    Pulmonary exam normal breath sounds clear to auscultation       Cardiovascular Exercise Tolerance: Good METShypertension, (-) CAD and (-) Past MI (-) dysrhythmias  Rhythm:Regular Rate:Normal - Systolic murmurs    Neuro/Psych PSYCHIATRIC DISORDERS negative neurological ROS     GI/Hepatic neg GERD  ,(+)     substance abuse  alcohol use, Chronic frequent alcohol abuse. Denies ever having DTs withdrawal symptoms    Endo/Other  neg diabetes  Renal/GU negative Renal ROS     Musculoskeletal   Abdominal   Peds  Hematology   Anesthesia Other Findings Past Medical History: No date: Alcohol abuse No date: Anemia No date: Electric injury No date: HLD (hyperlipidemia) No date: HTN (hypertension) No date: PAF (paroxysmal atrial fibrillation) (HCC)     Comment:  a. in the setting of PNA with empyema; b. CHADS2VASc =>               0; c. TTE 1/19: EF of 55-60%, unable to exlcude RWMA,               mitral valve with systolic bowing without prolapse,               mildly dilated RV cavity size with normal RV systolic               function, mildly dilated right atrium No date: PNA (pneumonia)     Comment:  a. 1/30: complicated by right-sided empyema which was                treated with chest tube placement and subsequent               bronchoscopy and right thoractomy with decortication and               pleural drainage of lung abscess No date: Thrombocytosis  Reproductive/Obstetrics                             Anesthesia Physical Anesthesia Plan  ASA: III  Anesthesia Plan: General   Post-op Pain Management:    Induction: Intravenous  PONV Risk Score and Plan: 2 and Ondansetron, Dexamethasone and Midazolam  Airway Management Planned: Oral ETT  Additional Equipment: None  Intra-op Plan:   Post-operative Plan: Extubation in OR  Informed Consent: I have reviewed the patients History and Physical, chart, labs and discussed the procedure including the risks, benefits and alternatives for the proposed anesthesia with the patient or authorized representative who has indicated his/her understanding and acceptance.     Dental advisory given  Plan Discussed with: CRNA and Surgeon  Anesthesia Plan Comments: (Discussed risks of anesthesia with patient, including PONV, sore throat, lip/dental damage. Rare risks discussed as well, such as cardiorespiratory and neurological sequelae. Patient counseled on being higher risk for anesthesia due to  comorbidities: acute (on chronic?) hyponatremia, current smoker, heavy alcohol abuse. Patient was told about increased risk of cardiac and respiratory events, including death. Counseled on benefits of smoking cessation. Patient understands. )        Anesthesia Quick Evaluation

## 2020-07-20 NOTE — Anesthesia Postprocedure Evaluation (Signed)
Anesthesia Post Note  Patient: Kristopher [name]  Procedure(s) Performed: OPEN REDUCTION INTERNAL FIXATION (ORIF) DISTAL RADIAL FRACTURE (Bilateral Wrist) CARPAL TUNNEL RELEASE (Left Wrist)  Patient location during evaluation: PACU Anesthesia Type: General Level of consciousness: awake and alert Pain management: pain level controlled Vital Signs Assessment: post-procedure vital signs reviewed and stable Respiratory status: spontaneous breathing, nonlabored ventilation, respiratory function stable and patient connected to nasal cannula oxygen Cardiovascular status: blood pressure returned to baseline and stable Postop Assessment: no apparent nausea or vomiting Anesthetic complications: no   No complications documented.   Last Vitals:  Vitals:   07/20/20 2030 07/20/20 2128  BP: (!) 163/84 (!) 143/80  Pulse: 86 81  Resp: 20 18  Temp:  36.7 C  SpO2: 93% 95%    Last Pain:  Vitals:   07/20/20 2030  TempSrc:   PainSc: 6                  Arita Miss

## 2020-07-20 NOTE — Op Note (Signed)
07/20/2020  7:54 PM  PATIENT:  Kristopher [name]  60 y.o. male  PRE-OPERATIVE DIAGNOSIS:  Wrist Fractures bilateral  POST-OPERATIVE DIAGNOSIS:  Wrist Fractures bilateral  PROCEDURE:  Procedure(s): OPEN REDUCTION INTERNAL FIXATION (ORIF) DISTAL RADIAL FRACTURE (Bilateral) CARPAL TUNNEL RELEASE (Left)  SURGEON: Laurene Footman, MD  ASSISTANTS: None  ANESTHESIA:   general  EBL:  No intake/output data recorded.  BLOOD ADMINISTERED:none  DRAINS: none   LOCAL MEDICATIONS USED:  MARCAINE     SPECIMEN:  No Specimen  DISPOSITION OF SPECIMEN:  N/A  COUNTS:  YES  TOURNIQUET:   Total Tourniquet Time Documented: Upper Arm (Right) - 23 minutes Upper Arm (Right) - 33 minutes Total: Upper Arm (Right) - 56 minutes   IMPLANTS: [employer] hand innovations short narrow plate right and left with multiple smooth pegs, multidirectional screws and 3.5 cortical screws.  DICTATION: Viviann Spare Dictation patient brought the operating room and after adequate anesthesia was obtained the right arm was prepped and draped you sterile fashion with a tourniquet by the upper arm.  After prepping and draping and appropriate patient identification timeout procedures completed tourniquet was raised and a volar approach was made with fingertrap traction applied through the index and middle fingers of the end of the bed with 10 pounds of traction.  Incision was centered over the FCR tendon tendon tendon sheath incised and the tendon retracted radially with the pronator elevated off the proximal and distal fragments.  The fracture had slight comminution.  Because of this there is some difficulty to get anatomic reduction but when it reduction was obtained the plate was applied with a distal first technique with both variable angle screws and smooth pegs utilized followed by bringing the plate down to the shaft with 3 cortical screws.  Traction was removed and there was no penetration of hardware into the joint traction out  without traction off and direct visualization under fluoroscopic view showed stable fixation of the fracture with near anatomic alignment.  The wound was irrigated the tourniquet let down and the wound then closed with 3-0 Vicryl subcutaneously and 4-0 nylon for the skin with a sterile dressing of Xeroform 4 x 4 web roll and the left side till the close of the other case.  Following this the left arm was prepped and draped in usual sterile fashion with a tourniquet by the upper elbow with an IV above the elbow with begin using the traction table identical fixation for the fracture and then carpal tunnel release was carried out through a 2 cm incision with the transverse carpal ligament identified incised and the underlying structure protected with release carried out proximally and distally until there is no compression of the nerve.  This wound was infiltrated 10 cc of half percent Sensorcaine taken postop algesia and the wound closed with simple erupted 4-0 nylon following identical closure of the volar incision of the distal radius with splints and Ace wrap was also applied.    PLAN OF CARE: Admit to inpatient   PATIENT DISPOSITION:  PACU - hemodynamically stable.

## 2020-07-20 NOTE — Anesthesia Procedure Notes (Signed)
Procedure Name: Intubation Date/Time: 07/20/2020 5:51 PM Performed by: Doreen Salvage, CRNA Pre-anesthesia Checklist: Patient identified, Patient being monitored, Timeout performed, Emergency Drugs available and Suction available Patient Re-evaluated:Patient Re-evaluated prior to induction Oxygen Delivery Method: Circle system utilized Preoxygenation: Pre-oxygenation with 100% oxygen Induction Type: IV induction Ventilation: Mask ventilation without difficulty Laryngoscope Size: Mac, McGraph and 4 Grade View: Grade I Tube type: Oral Tube size: 7.5 mm Number of attempts: 1 Airway Equipment and Method: Stylet Placement Confirmation: ETT inserted through vocal cords under direct vision,  positive ETCO2 and breath sounds checked- equal and bilateral Secured at: 21 cm Tube secured with: Tape Dental Injury: Teeth and Oropharynx as per pre-operative assessment

## 2020-07-21 ENCOUNTER — Encounter: Payer: Self-pay | Admitting: Orthopedic Surgery

## 2020-07-21 DIAGNOSIS — S52202A Unspecified fracture of shaft of left ulna, initial encounter for closed fracture: Secondary | ICD-10-CM

## 2020-07-21 DIAGNOSIS — S52201A Unspecified fracture of shaft of right ulna, initial encounter for closed fracture: Secondary | ICD-10-CM

## 2020-07-21 LAB — CBC
HCT: 35.4 % — ABNORMAL LOW (ref 39.0–52.0)
Hemoglobin: 12.5 g/dL — ABNORMAL LOW (ref 13.0–17.0)
MCH: 33.5 pg (ref 26.0–34.0)
MCHC: 35.3 g/dL (ref 30.0–36.0)
MCV: 94.9 fL (ref 80.0–100.0)
Platelets: 302 10*3/uL (ref 150–400)
RBC: 3.73 MIL/uL — ABNORMAL LOW (ref 4.22–5.81)
RDW: 11.6 % (ref 11.5–15.5)
WBC: 11 10*3/uL — ABNORMAL HIGH (ref 4.0–10.5)
nRBC: 0 % (ref 0.0–0.2)

## 2020-07-21 LAB — BASIC METABOLIC PANEL
Anion gap: 8 (ref 5–15)
BUN: 9 mg/dL (ref 8–23)
CO2: 24 mmol/L (ref 22–32)
Calcium: 8.5 mg/dL — ABNORMAL LOW (ref 8.9–10.3)
Chloride: 99 mmol/L (ref 98–111)
Creatinine, Ser: 0.56 mg/dL — ABNORMAL LOW (ref 0.61–1.24)
GFR, Estimated: >60 mL/min (ref >60)
Glucose, Bld: 152 mg/dL — ABNORMAL HIGH (ref 70–99)
Potassium: 4.4 mmol/L (ref 3.5–5.1)
Sodium: 131 mmol/L — ABNORMAL LOW (ref 135–145)

## 2020-07-21 MED ORDER — TRAMADOL HCL 50 MG PO TABS: 50.0000 mg | ORAL_TABLET | Freq: Four times a day (QID) | ORAL | 0 refills | Status: DC | PRN

## 2020-07-21 NOTE — Discharge Instructions (Signed)
Diet: As you were doing prior to hospitalization   Shower:  May shower but keep the splints dry, use an occlusive plastic wrap, NO SOAKING IN TUB.  If the bandage gets wet, change with a clean dry gauze.  Dressing: Keep splint clean and dry at all times  Activity:  Increase activity slowly as tolerated, but follow the weight bearing instructions below.  No lifting or driving for 6 weeks.  Weight Bearing:   Do not apply any weight to the left or right upper extremities.  To prevent constipation: you may use a stool softener such as -  Colace (over the counter) 100 mg by mouth twice a day  Drink plenty of fluids (prune juice may be helpful) and high fiber foods Miralax (over the counter) for constipation as needed.    Itching:  If you experience itching with your medications, try taking only a single pain pill, or even half a pain pill at a time.  You may take up to 10 pain pills per day, and you can also use benadryl over the counter for itching or also to help with sleep.   Precautions:  If you experience chest pain or shortness of breath - call 911 immediately for transfer to the hospital emergency department!!  If you develop a fever greater that 101 F, purulent drainage from wound, increased redness or drainage from wound, or calf pain-Call [facility]                                               Follow- Up Appointment:  Please call for an appointment to be seen in 3 to 5 days at [facility] for Wrist or Forearm, Care After This sheet gives you information about how to care for yourself after your procedure. Your health care provider may also give you more specific instructions. If you have problems or questions, contact your health care provider. What can I expect after the procedure? After the procedure, it is common to have:  Pain.  Swelling. Follow these instructions at home: If you have a splint:  Wear the splint as told by your  health care provider. Remove it only as told by your health care provider.  Loosen the splint if your fingers tingle, become numb, or turn cold and blue.  Keep the splint clean.  If the splint is not waterproof: ? Do not let it get wet. ? Cover it with a watertight covering when you take a bath or shower.   If you have a cast:  Do not stick anything inside the cast to scratch your skin. Doing that increases your risk of infection.  Check the skin around the cast every day. Tell your health care provider about any concerns.  You may put lotion on dry skin around the edges of the cast. Do not put lotion on the skin underneath the cast.  Keep the cast clean.  If the cast is not waterproof: ? Do not let it get wet. ? Cover it with a watertight covering when you take a bath or shower. Managing pain, stiffness, and swelling  If directed, put ice on the injured area. To do this: ? If you have a removable splint, remove it as told by your health care provider. ? Put ice in a plastic bag. ? Place a towel between your  skin and the bag or between your cast and the bag. ? Leave the ice on for 20 minutes, 2-3 times per day.  Move your fingers often to reduce stiffness and swelling.  Raise (elevate) the injured area above the level of your heart while you are sitting or lying down.   Driving  Ask your health care provider if the medicine prescribed to you requires you to avoid driving or using heavy machinery.  Do not drive for 24 hours if you were given a sedative during your procedure.  Ask your health care provider when it is safe to drive if you have a cast or splint on your arm. Activity  Return to your normal activities as told by your health care provider. Ask your health care provider what activities are safe for you.  Do exercises as told by your health care provider. General instructions  Do not put pressure on any part of the cast or splint until it is fully hardened. This  may take several hours.  Take over-the-counter and prescription medicines only as told by your health care provider.  Do not use any products that contain nicotine or tobacco, such as cigarettes, e-cigarettes, and chewing tobacco. These can delay bone healing. If you need help quitting, ask your health care provider.  Keep all follow-up visits as told by your health care provider. This is important. Contact a health care provider if:  You have a fever.  Your pain is not controlled by your pain medicine. Get help right away if:  You have severe pain.  You have a severe increase in swelling.  Your fingers become very cold or blue.  You have numbness, tingling, or loss of feeling in your hands or fingers. Summary  After the procedure, it is common to have pain and swelling.  Return to your normal activities as told by your health care provider. Ask your health care provider what activities are safe for you.  Get help right away if you have a severe increase in swelling, your fingers become very cold or blue, or you have numbness, tingling, or loss of feeling in your hands or fingers. This information is not intended to replace advice given to you by your health care provider. Make sure you discuss any questions you have with your health care provider. Document Revised: 09/17/2018 Document Reviewed: 09/17/2018 Elsevier Patient Education  [place].

## 2020-07-21 NOTE — TOC Progression Note (Signed)
Transition of Care [facility]) - Progression Note    Patient Details  Name: TYSIN SALADA MRN: [id number] Date of Birth: 1960/11/06  Transition of Care [facility]) CM/SW [place], RN Phone Number: 07/21/2020, 8:56 AM  Clinical Narrative:  Mett with the patient to discuss DC plan and needs, he stated that he lives at home and his daughter lives with him, he is able to do what he needs to do at home and plans to return home, He stated that he has had issues with his hands in the past and is aware of the exercises and stretches he is supposed to do, He stated that his daughter drives him where he needs to go and he can afford his medication, I provided him with ETOH and Substance abuse resources, He stated he is ready to go home and that laying in the hospital is not going to help him, Will continue to monitor for additional needs         Expected Discharge Plan and Services                                                 Social Determinants of Health (SDOH) Interventions    Readmission Risk Interventions No flowsheet data found.

## 2020-07-21 NOTE — Progress Notes (Signed)
Pt being discharged home, discharge instructions and education provided, discharge paperwork given to patient. Patient verbalizes understanding of discharge instructions. PIV removed. Patient off unit via [name] accompanied by this RN.  11:12 AM 07/21/20 Kristopher [name]

## 2020-07-21 NOTE — Progress Notes (Signed)
Subjective: 1 Day Post-Op Procedure(s) (LRB): OPEN REDUCTION INTERNAL FIXATION (ORIF) DISTAL RADIAL FRACTURE (Bilateral) CARPAL TUNNEL RELEASE (Left) Patient states pain is mild to moderate.  Still having some numbness and tingling in the left hand but overall improved.  No numbness or tingling in the right hand.  He is tolerating the splints well.  No drainage.  Patient requesting to get out of hospital as soon as possible.  Objective: Vital signs in last 24 hours: Temp:  [97.6 F (36.4 C)-98.7 F (37.1 C)] 98.7 F (37.1 C) (05/13 0227) Pulse Rate:  [66-93] 72 (05/13 0656) Resp:  [15-22] 18 (05/13 0227) BP: (122-178)/(71-95) 167/76 (05/13 0656) SpO2:  [93 %-100 %] 95 % (05/13 0227) Weight:  [59 kg-62.4 kg] 59 kg (05/13 0333)  Intake/Output from previous day: 05/12 0701 - 05/13 0700 In: 700 [P.O.:200; I.V.:300; IV Piggyback:200] Out: 3900 [Urine:3900] Intake/Output this shift: No intake/output data recorded.  Recent Labs    07/19/20 1755 07/20/20 0039 07/20/20 1554 07/21/20 0516  HGB 14.4 13.5 13.6 12.5*   Recent Labs    07/20/20 0039 07/20/20 1554 07/21/20 0516  WBC 14.9*  --  11.0*  RBC 4.04*  --  3.73*  HCT 38.3* 40.0 35.4*  PLT 301  --  302   Recent Labs    07/20/20 0039 07/20/20 1554 07/21/20 0516  NA 125* 127* 131*  K 4.2 4.3 4.4  CL 90* 95* 99  CO2 24  --  24  BUN 7* 6* 9  CREATININE 0.55* 0.50* 0.56*  GLUCOSE 111* 96 152*  CALCIUM 8.6*  --  8.5*   Recent Labs    07/20/20 0039  INR 1.0    EXAM General - Patient is Alert, Appropriate and Oriented Left and right upper extremity - Neurovascular intact Sensation intact distally  Close amazing full composite fist.  Minimal swelling throughout the digits. Dressing - dressing C/D/I and no drainage  Past Medical History:  Diagnosis Date  . Alcohol abuse   . Anemia   . Electric injury   . HLD (hyperlipidemia)   . HTN (hypertension)   . PAF (paroxysmal atrial fibrillation) (HCC)    a. in  the setting of PNA with empyema; b. CHADS2VASc => 0; c. TTE 1/19: EF of 55-60%, unable to exlcude RWMA, mitral valve with systolic bowing without prolapse, mildly dilated RV cavity size with normal RV systolic function, mildly dilated right atrium  . PNA (pneumonia)    a. 4/19: complicated by right-sided empyema which was treated with chest tube placement and subsequent bronchoscopy and right thoractomy with decortication and pleural drainage of lung abscess  . Thrombocytosis     Assessment/Plan:   1 Day Post-Op Procedure(s) (LRB): OPEN REDUCTION INTERNAL FIXATION (ORIF) DISTAL RADIAL FRACTURE (Bilateral) CARPAL TUNNEL RELEASE (Left) Principal Problem:   Ribs, multiple fractures Active Problems:   Paroxysmal atrial fibrillation (HCC)   Distal radius fracture, left   Distal radius fracture, right   Fall   Acute hyponatremia   Postural dizziness with presyncope   Leukocytosis   Closed right scapular fracture  Estimated body mass index is 17.63 kg/m as calculated from the following:   Height as of this encounter: 6' (1.829 m).   Weight as of this encounter: 59 kg.   Pain doing well status post bilateral distal radius ORIF with left carpal tunnel release.  Recommend follow-up in the office first of next week for dressing change and application of Velcro wrist braces.  Patient will remain nonweightbearing and keep dressings clean and  dry.   Ronney Asters, PA-C [place] 07/21/2020, 7:26 AM

## 2020-07-21 NOTE — Evaluation (Signed)
Physical Therapy Evaluation Patient Details Name: Kristopher [name] MRN: [id number] DOB: 1960-04-19 Today's Date: 07/21/2020   History of Present Illness  Pt admitted for multiple injuries s/p fall off roof. Pt now s/p B UE ORIF and L carpal tunnel release 07/20/20. HIstory includes alcohol abuse, HLD, HTN, PAF, and Afib. Of note, R scapular fx and R rib fractures. Pt cleared to work with therapy this date.  Clinical Impression  Pt is a pleasant 60 year old male who was admitted for R scapular fx, B UE distal radius fx s/p B UE ORIF, L carpal tunnel release, and R rib fractures. Pt lacks insight into deficits reporting he is familiar with recovery process and doesn't want any therapy at this time, reports he can self rehab. Educated on precautions and problem-solved ADL task. Pt demonstrates all bed mobility/transfers/ambulation at baseline level. Able to stand from multiple height surfaces and able to perform without use of B UEs. Reports he will have 24/7 assist from daughter for tasks. Pt does not require any further PT needs at this time. Pt will be dc in house and does not require follow up. RN aware. Will dc current orders.     Follow Up Recommendations No PT follow up (recommend OT)    Equipment Recommendations  None recommended by PT    Recommendations for Other Services       Precautions / Restrictions Precautions Precautions: Fall Required Braces or Orthoses: Splint/Cast Splint/Cast: B UE Restrictions Weight Bearing Restrictions: Yes RUE Weight Bearing: Non weight bearing LUE Weight Bearing: Non weight bearing Other Position/Activity Restrictions: B LE WBAT      Mobility  Bed Mobility Overal bed mobility: Independent             General bed mobility comments: able to perform bed mobility with independence without use of B UE. Refused to have physical assist. Once seated at EOB, able to sit with upright posture    Transfers Overall transfer level: Independent Equipment  used: None             General transfer comment: able to stand from multiple surfaces without use of arms. Safe technique  Ambulation/Gait Ambulation/Gait assistance: Supervision Gait Distance (Feet): 50 Feet Assistive device: None Gait Pattern/deviations: Step-through pattern     General Gait Details: no dizziness with ambulation. Reports he prefers to ambulate in room only. Able to complete turns without assist  Stairs            Wheelchair Mobility    Modified Rankin (Stroke Patients Only)       Balance Overall balance assessment: No apparent balance deficits (not formally assessed)                                           Pertinent Vitals/Pain Pain Assessment: Faces Faces Pain Scale: Hurts a little bit Pain Location: B arms and shoulder Pain Descriptors / Indicators: Aching Pain Intervention(s): Limited activity within patient's tolerance    Home Living Family/patient expects to be discharged to:: Private residence Living Arrangements: Children (daughter and her 2 young children) Available Help at Discharge: Family;Available 24 hours/day Type of Home: House Home Access: Level entry (through back entry)     Home Layout: One level Home Equipment: Hospital bed (lift chair)      Prior Function Level of Independence: Independent         Comments: active, works as  electrican.     Hand Dominance        Extremity/Trunk Assessment   Upper Extremity Assessment Upper Extremity Assessment: Overall WFL for tasks assessed (not formally tested, however does have functional movement)    Lower Extremity Assessment Lower Extremity Assessment: Overall WFL for tasks assessed       Communication   Communication: No difficulties  Cognition Arousal/Alertness: Awake/alert Behavior During Therapy: WFL for tasks assessed/performed Overall Cognitive Status: Within Functional Limits for tasks assessed                                  General Comments: pleasant and in good spirits, does demonstrate lack of insight to deficits, brushes off deficits in nonchalant way      General Comments      Exercises Other Exercises Other Exercises: multiple sit<>Stands for strengthening and adherence to precautions. Safe technique, does complain of back pain with exertion Other Exercises: able to ambulate to bathroom with safe technique. Stands to urinate without assist. No dizziness reported   Assessment/Plan    PT Assessment Patent does not need any further PT services  PT Problem List         PT Treatment Interventions      PT Goals (Current goals can be found in the Care Plan section)  Acute Rehab PT Goals Patient Stated Goal: to go home PT Goal Formulation: All assessment and education complete, DC therapy Time For Goal Achievement: 07/21/20 Potential to Achieve Goals: Good    Frequency     Barriers to discharge        Co-evaluation               AM-PAC PT "6 Clicks" Mobility  Outcome Measure Help needed turning from your back to your side while in a flat bed without using bedrails?: None Help needed moving from lying on your back to sitting on the side of a flat bed without using bedrails?: None Help needed moving to and from a bed to a chair (including a wheelchair)?: None Help needed standing up from a chair using your arms (e.g., wheelchair or bedside chair)?: A Little Help needed to walk in hospital room?: None Help needed climbing 3-5 steps with a railing? : None 6 Click Score: 23    End of Session   Activity Tolerance: Patient tolerated treatment well Patient left: in bed;with bed alarm set (seated on EOB) Nurse Communication: Mobility status PT Visit Diagnosis: Pain Pain - Right/Left:  (bilat UE) Pain - part of body: Arm    Time: 2409-7353 PT Time Calculation (min) (ACUTE ONLY): 22 min   Charges:   PT Evaluation $PT Eval Low Complexity: 1 Low PT Treatments $Therapeutic  Activity: 8-22 mins        Greggory Stallion, PT, DPT [phone]   Eliud Polo 07/21/2020, 11:45 AM

## 2020-07-23 LAB — CALCIUM, IONIZED: Calcium, Ionized, Serum: 4.7 mg/dL (ref 4.5–5.6)

## 2020-07-24 NOTE — Discharge Summary (Signed)
[facility] [place] NAME: Kristopher [name]    MR#:  [id number]  DATE OF BIRTH:  10/15/1960  DATE OF ADMISSION:  07/19/2020   ADMITTING PHYSICIAN: Rhetta Mura, DO  DATE OF DISCHARGE: 07/21/2020 11:21 AM  PRIMARY CARE PHYSICIAN: Steele Sizer, MD   ADMISSION DIAGNOSIS:  Dizziness [R42] Ribs, multiple fractures [S22.49XA] Chest trauma [S29.9XXA] Fall, initial encounter [id number]] Closed nondisplaced fracture of body of right scapula, initial encounter [S42.114A] Closed fracture of bilateral radius and ulna, initial encounter [S52.91XA, S52.201A, S52.202A, S52.92XA] Closed fracture of multiple ribs of right side, initial encounter [S22.41XA] DISCHARGE DIAGNOSIS:  Principal Problem:   Ribs, multiple fractures Active Problems:   Paroxysmal atrial fibrillation (HCC)   Distal radius fracture, left   Distal radius fracture, right   Fall   Acute hyponatremia   Postural dizziness with presyncope   Leukocytosis   Closed right scapular fracture   Closed fracture of bilateral radius and ulna  SECONDARY DIAGNOSIS:   Past Medical History:  Diagnosis Date  . Alcohol abuse   . Anemia   . Electric injury   . HLD (hyperlipidemia)   . HTN (hypertension)   . PAF (paroxysmal atrial fibrillation) (HCC)    a. in the setting of PNA with empyema; b. CHADS2VASc => 0; c. TTE 1/19: EF of 55-60%, unable to exlcude RWMA, mitral valve with systolic bowing without prolapse, mildly dilated RV cavity size with normal RV systolic function, mildly dilated right atrium  . PNA (pneumonia)    a. 2/70: complicated by right-sided empyema which was treated with chest tube placement and subsequent bronchoscopy and right thoractomy with decortication and pleural drainage of lung abscess  . Thrombocytosis    HOSPITAL COURSE:  Kristopher [name] a 60 y.o.malewith medical history significant forparoxysmal atrial fibrillationfor which he is notchronically  anticoagulated,pneumonia complicated by empyema in January 2019,chronic alcohol abuse is admitted with multiple acute right-sided rib fractures and bilateral distal radius fractures status post fall off of a roof.  #)Multiple consecutiveright-sided rib fractures and distal radius fracture, Right scapular body fracture:Following fall off the first storyroofwithout loss of consciousness, presenting imaging shows evidence of nondisplaced fractures of the right sixth and seventh ribs as well as mildly displaced fracture of the right fifth rib, without any evidence of associated hematoma or pneumothorax.  -S/p bilateral distal radius ORIF with left carpal tunnel release with Dr. Rudene Christians  #) Presyncope: Likely due to orthostatic hypotension in the setting of dehydration given the patient's report of very limited consumption of water wall working in greater than 80 degree heat in the direct sun, while engaging in diuresis via alcohol consumption. -Resolved with IV hydration.  Encouraged oral hydration  #) Ascending aorta aneurysm:CT imaging demonstrated mild dilation of the ascending aorta at 4 cm without any dissection.Recommend annual imaging follow-up per the recommendations of radiologyas an outpatient  #) Acute hypoosmolar hyponatremia: serum sodium 129->131.  Improved with hydration  #) Chronic Alcohol Abuse:  No signs of withdrawal.  Counseled for cessation  #) Paroxysmal atrial fibrillation: Currently in sinus rhythm.  No need for anticoagulation.  #) Essential hypertension:  Well controlled  #) Hyperlipidemia: Patient is not compliant with statin that he was prescribed  #) Chronic tobacco abuse: The patient reports that he is a current smoker, having smoked 1 to 1-1/2 packs/day over the last 35 to 40 years. Counseled for cessation   DISCHARGE CONDITIONS:  Stable CONSULTS OBTAINED:  Treatment Team:  Rudene Christians,  Legrand Como, MD DRUG ALLERGIES:  No Known Allergies DISCHARGE  MEDICATIONS:   Allergies as of 07/21/2020   No Known Allergies     Medication List    STOP taking these medications   albuterol 108 (90 Base) MCG/ACT inhaler Commonly known as: VENTOLIN HFA   Anoro Ellipta 62.5-25 MCG/INH Aepb Generic drug: umeclidinium-vilanterol   aspirin EC 81 MG tablet   atorvastatin 20 MG tablet Commonly known as: LIPITOR   MULTIVITAMIN ADULT PO   valsartan 160 MG tablet Commonly known as: Diovan     TAKE these medications   traMADol 50 MG tablet Commonly known as: Ultram Take 1 tablet (50 mg total) by mouth every 6 (six) hours as needed.            Discharge Care Instructions  (From admission, onward)         Start     Ordered   07/21/20 0000  Discharge wound care:       Comments: Pain doing well status post bilateral distal radius ORIF with left carpal tunnel release.  Recommend follow-up with ortho in the office first of next week for dressing change and application of Velcro wrist braces.  Patient will remain nonweightbearing and keep dressings clean and dry.   07/21/20 1010         DISCHARGE INSTRUCTIONS:  Recommend follow-up with Ortho office first of next week for dressing change and application of Velcro wrist braces.  Patient will remain nonweightbearing and keep dressings clean and dry. DIET:  Regular diet DISCHARGE CONDITION:  Stable ACTIVITY:  Activity as tolerated Recommend follow-up with Ortho office first of next week for dressing change and application of Velcro wrist braces.  Patient will remain nonweightbearing and keep dressings clean and dry. OXYGEN:  Home Oxygen: No.  Oxygen Delivery: room air DISCHARGE LOCATION:  home   If you experience worsening of your admission symptoms, develop shortness of breath, life threatening emergency, suicidal or homicidal thoughts you must seek medical attention immediately by calling 911 or calling your MD immediately  if symptoms less severe.  You Must read complete  instructions/literature along with all the possible adverse reactions/side effects for all the Medicines you take and that have been prescribed to you. Take any new Medicines after you have completely understood and accpet all the possible adverse reactions/side effects.   Please note  You were cared for by a hospitalist during your hospital stay. If you have any questions about your discharge medications or the care you received while you were in the hospital after you are discharged, you can call the unit and asked to speak with the hospitalist on call if the hospitalist that took care of you is not available. Once you are discharged, your primary care physician will handle any further medical issues. Please note that NO REFILLS for any discharge medications will be authorized once you are discharged, as it is imperative that you return to your primary care physician (or establish a relationship with a primary care physician if you do not have one) for your aftercare needs so that they can reassess your need for medications and monitor your lab values.    On the day of Discharge:  VITAL SIGNS:  Blood pressure (!) 146/80, pulse 69, temperature 98.2 F (36.8 C), temperature source Oral, resp. rate 18, height 6' (1.829 m), weight 59 kg, SpO2 94 %. PHYSICAL EXAMINATION:  GENERAL:  60 y.o.-year-old patient lying in the bed with no acute distress.  EYES: Pupils equal, round, reactive  to light and accommodation. No scleral icterus. Extraocular muscles intact.  HEENT: Head atraumatic, normocephalic. Oropharynx and nasopharynx clear.  NECK:  Supple, no jugular venous distention. No thyroid enlargement, no tenderness.  LUNGS: Normal breath sounds bilaterally, no wheezing, rales,rhonchi or crepitation. No use of accessory muscles of respiration.  CARDIOVASCULAR: S1, S2 normal. No murmurs, rubs, or gallops.  ABDOMEN: Soft, non-tender, non-distended. Bowel sounds present. No organomegaly or mass.   EXTREMITIES: No pedal edema, cyanosis, or clubbing.  NEUROLOGIC: Cranial nerves II through XII are intact. Muscle strength 5/5 in all extremities. Sensation intact. Gait not checked.  PSYCHIATRIC: The patient is alert and oriented x 3.  SKIN: No obvious rash, lesion, or ulcer.  DATA REVIEW:   CBC Recent Labs  Lab 07/21/20 0516  WBC 11.0*  HGB 12.5*  HCT 35.4*  PLT 302    Chemistries  Recent Labs  Lab 07/20/20 0039 07/20/20 1554 07/21/20 0516  NA 125*   < > 131*  K 4.2   < > 4.4  CL 90*   < > 99  CO2 24  --  24  GLUCOSE 111*   < > 152*  BUN 7*   < > 9  CREATININE 0.55*   < > 0.56*  CALCIUM 8.6*  --  8.5*  MG 1.8  --   --   AST 79*  --   --   ALT 30  --   --   ALKPHOS 70  --   --   BILITOT 0.9  --   --    < > = values in this interval not displayed.     Outpatient follow-up  Follow-up Information    Duanne Guess, PA-C. Go on 07/24/2020.   Specialties: Orthopedic Surgery, Emergency Medicine Why: 2:30 pm Contact information: [place] [address] [phone]        Delsa Grana, PA-C. Go on 08/11/2020.   Specialty: Family Medicine Why: 2:40 pm. Office will call if an earlier appoitnment becomes available. Contact information: [address] [place] [address] [phone]               30 Day Unplanned Readmission Risk Score   Flowsheet Row ED to Hosp-Admission (Discharged) from 07/19/2020 in [facility] (1A)  30 Day Unplanned Readmission Risk Score (%) 14.52 Filed at 07/21/2020 0801     This score is the patient's risk of an unplanned readmission within 30 days of being discharged (0 -100%). The score is based on dignosis, age, lab data, medications, orders, and past utilization.   Low:  0-14.9   Medium: 15-21.9   High: 22-29.9   Extreme: 30 and above         Management plans discussed with the patient, family and they are in agreement.  CODE STATUS: Prior   TOTAL  TIME TAKING CARE OF THIS PATIENT: 45 minutes.    Max Sane M.D on 07/24/2020 at 4:31 PM  [facility]   CC: Primary care physician; Steele Sizer, MD   Note: This dictation was prepared with Dragon dictation along with smaller phrase technology. Any transcriptional errors that result from this process are unintentional.

## 2020-08-11 ENCOUNTER — Inpatient Hospital Stay: Payer: 59 | Admitting: Family Medicine

## 2020-10-12 NOTE — Progress Notes (Signed)
Name: Kristopher [name]   MRN: [id number]    DOB: Aug 27, 1960   Date:10/13/2020       Progress Note  Subjective  Chief Complaint  Fall  HPI  Atherosclerosis of Aorta: he never filled rx of Atorvastatin given by Dr. Sanda Klein, previous PCP, back in 2019. Reviewed CT chest done and results including 3 vessel heart disease.. Last LDL was 70. Still advised low dose statin but he is not interested . CT chest done in May also showed dilation of aorta at 4 cm , we will place referral to vascular surgeon    Chronic bronchitis: he is a smoker, he has a history of empyema and chest tubes with scarring back in 2019 . He states he still smokes about half pack daily, has daily productive cough, yellow/green and some sob with activity. He has chest congestion and wheezing , worse when humidity is high . Discussed starting on inhaler but too expensive , we will try to give him a voucher. 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 Unknown cause of fall. He does not recall and it was not witnessed, daughter was looking at another direction. He was able to get up and  daughter drove him to [facility] , discussed referral to cardiologist or neurologist He has not been able to go back to work due to decrease rom of wrist , recently able to use a can opener but much slower than usual,  just now able to open door knobs , but mostly with right hand, left hand is much worse.       Patient Active Problem List   Diagnosis Date Noted   Closed fracture of bilateral radius and ulna    Ribs, multiple fractures 07/19/2020   Distal radius fracture, left 07/19/2020   Distal radius fracture, right 07/19/2020   Fall 07/19/2020   Acute hyponatremia 07/19/2020   Postural dizziness with presyncope 07/19/2020   Leukocytosis 07/19/2020   Closed right scapular fracture 07/19/2020   Paroxysmal atrial fibrillation ([place]) 12/01/2019   Prostate cancer screening 09/25/2017   Vitamin B12 deficiency 06/28/2017   Dyslipidemia 06/28/2017   Hx of pleural empyema 06/25/2017   Hx of tobacco use, presenting hazards to health 06/25/2017   Aortic atherosclerosis ([place]) 06/25/2017   Alcohol use 06/25/2017   Abnormal thyroid exam 06/25/2017    Past Surgical History:  Procedure Laterality Date   CARPAL TUNNEL RELEASE Left 07/20/2020   Procedure: CARPAL TUNNEL RELEASE;  Surgeon: Hessie Knows, MD;  Location: [facility] ORS;  Service: Orthopedics;  Laterality: Left;  DECORTICATION N/A 04/03/2017   Procedure: DECORTICATION;  Surgeon: Nestor Lewandowsky, MD;  Location: [facility] ORS;  Service: Thoracic;  Laterality: N/A;  debridement of lung abscess   OPEN REDUCTION INTERNAL FIXATION (ORIF) DISTAL RADIAL FRACTURE Bilateral 07/20/2020   Procedure: OPEN REDUCTION INTERNAL FIXATION (ORIF) DISTAL RADIAL FRACTURE;  Surgeon: Hessie Knows, MD;  Location: [facility] ORS;  Service: Orthopedics;  Laterality: Bilateral;   SKIN GRAFT     THORACOTOMY Right 04/03/2017   Procedure: THORACOTOMY MAJOR;  Surgeon: Nestor Lewandowsky, MD;  Location: [facility] ORS;  Service: Thoracic;  Laterality: Right;   VIDEO BRONCHOSCOPY N/A 04/03/2017    Procedure: PREOP BRONCHOSCOPY;  Surgeon: Nestor Lewandowsky, MD;  Location: [facility] ORS;  Service: Thoracic;  Laterality: N/A;    Family History  Problem Relation Age of Onset   Atrial fibrillation Mother    Dementia Mother     Social History   Tobacco Use   Smoking status: Every Day    Packs/day: 1.50    Years: 35.00    Pack years: 52.50    Types: Cigarettes   Smokeless tobacco: Never  Substance Use Topics   Alcohol use: Yes    Comment: 42-126 beers (12 oz) per week     Current Outpatient Medications:    traMADol (ULTRAM) 50 MG tablet, Take 1 tablet (50 mg total) by mouth every 6 (six) hours as needed. (Patient not taking: Reported on 10/13/2020), Disp: 20 tablet, Rfl: 0  No Known Allergies  I personally reviewed active problem list, medication list, allergies, family history, social history, health maintenance with the patient/caregiver today.   ROS  Constitutional: Negative for fever or weight change.  Respiratory: Negative for cough and shortness of breath.   Cardiovascular: Negative for chest pain or palpitations.  Gastrointestinal: Negative for abdominal pain, no bowel changes.  Musculoskeletal: Negative for gait problem or joint swelling.  Skin: Negative for rash.  Neurological: Negative for dizziness or headache.  No other specific complaints in a complete review of systems (except as listed in HPI above).   Objective  Vitals:   10/13/20 1347  BP: 128/72  Pulse: 94  Resp: 16  Temp: 98.2 F (36.8 C)  SpO2: 98%  Weight: 134 lb (60.8 kg)  Height: '5\' 10"'$  (1.778 m)    Body mass index is 19.23 kg/m.  Physical Exam  Constitutional: Patient appears well-developed and well-nourished. Thin  No distress.  HEENT: head atraumatic, normocephalic, pupils equal and reactive to light,  neck supple,  Cardiovascular: Normal rate, regular rhythm and normal heart sounds.  No murmur heard. No BLE edema. Pulmonary/Chest: Effort normal and breath sounds normal. No respiratory  distress. Abdominal: Soft.  There is no tenderness. Psychiatric: Patient has a normal mood and affect. behavior is normal. Judgment and thought content normal.  Muscular Skeletal: decrease rom of both wrists, left wrists a little larger than righ but no redness or increase in warmth   Recent Results (from the past 2160 hour(s))  CBC with Differential     Status: Abnormal   Collection Time: 07/19/20  5:55 PM  Result Value Ref Range   WBC 12.4 (H) 4.0 - 10.5 K/uL   RBC 4.34 4.22 - 5.81 MIL/uL   Hemoglobin 14.4 13.0 - 17.0 g/dL   HCT 40.8 39.0 - 52.0 %   MCV 94.0 80.0 - 100.0 fL   MCH 33.2 26.0 - 34.0 pg   MCHC 35.3 30.0 - 36.0 g/dL   RDW 11.5 11.5 - 15.5 %   Platelets 326 150 - 400 K/uL  nRBC 0.0 0.0 - 0.2 %   Neutrophils Relative % 72 %   Neutro Abs 9.0 (H) 1.7 - 7.7 K/uL   Lymphocytes Relative 13 %   Lymphs Abs 1.7 0.7 - 4.0 K/uL   Monocytes Relative 11 %   Monocytes Absolute 1.3 (H) 0.1 - 1.0 K/uL   Eosinophils Relative 1 %   Eosinophils Absolute 0.1 0.0 - 0.5 K/uL   Basophils Relative 0 %   Basophils Absolute 0.1 0.0 - 0.1 K/uL   Immature Granulocytes 3 %   Abs Immature Granulocytes 0.33 (H) 0.00 - 0.07 K/uL    Comment: Performed at [facility], [address], [place], [place] [id number]  Basic metabolic panel     Status: Abnormal   Collection Time: 07/19/20  5:55 PM  Result Value Ref Range   Sodium 129 (L) 135 - 145 mmol/L   Potassium 4.1 3.5 - 5.1 mmol/L   Chloride 95 (L) 98 - 111 mmol/L   CO2 23 22 - 32 mmol/L   Glucose, Bld 118 (H) 70 - 99 mg/dL    Comment: Glucose reference range applies only to samples taken after fasting for at least 8 hours.   BUN 8 8 - 23 mg/dL   Creatinine, Ser 0.66 0.61 - 1.24 mg/dL   Calcium 8.8 (L) 8.9 - 10.3 mg/dL   GFR, Estimated >60 >60 mL/min    Comment: (NOTE) Calculated using the CKD-EPI Creatinine Equation (2021)    Anion gap 11 5 - 15    Comment: Performed at [facility], [address], [place],  [place] [address]  Magnesium     Status: None   Collection Time: 07/19/20  5:55 PM  Result Value Ref Range   Magnesium 2.0 1.7 - 2.4 mg/dL    Comment: Performed at [facility], [address], [place], [place] [address]  Resp Panel by RT-PCR (Flu A&B, Covid) Nasopharyngeal Swab     Status: None   Collection Time: 07/19/20  7:54 PM   Specimen: Nasopharyngeal Swab; Nasopharyngeal(NP) swabs in vial transport medium  Result Value Ref Range   SARS Coronavirus 2 by RT PCR NEGATIVE NEGATIVE    Comment: (NOTE) SARS-CoV-2 target nucleic acids are NOT DETECTED.  The SARS-CoV-2 RNA is generally detectable in upper respiratory specimens during the acute phase of infection. The lowest concentration of SARS-CoV-2 viral copies this assay can detect is 138 copies/mL. A negative result does not preclude SARS-Cov-2 infection and should not be used as the sole basis for treatment or other patient management decisions. A negative result may occur with  improper specimen collection/handling, submission of specimen other than nasopharyngeal swab, presence of viral mutation(s) within the areas targeted by this assay, and inadequate number of viral copies(<138 copies/mL). A negative result must be combined with clinical observations, patient history, and epidemiological information. The expected result is Negative.  Fact Sheet for Patients:  EntrepreneurPulse.com.au  Fact Sheet for Healthcare Providers:  IncredibleEmployment.be  This test is no t yet approved or cleared by the [place] FDA and  has been authorized for detection and/or diagnosis of SARS-CoV-2 by FDA under an Emergency Use Authorization (EUA). This EUA will remain  in effect (meaning this test can be used) for the duration of the COVID-19 declaration under Section 564(b)(1) of the Act, 21 U.S.C.section 360bbb-3(b)(1), unless the authorization is terminated  or revoked sooner.       Influenza  A by PCR NEGATIVE NEGATIVE   Influenza B by PCR NEGATIVE NEGATIVE  Comment: (NOTE) The Xpert Xpress SARS-CoV-2/FLU/RSV plus assay is intended as an aid in the diagnosis of influenza from Nasopharyngeal swab specimens and should not be used as a sole basis for treatment. Nasal washings and aspirates are unacceptable for Xpert Xpress SARS-CoV-2/FLU/RSV testing.  Fact Sheet for Patients: EntrepreneurPulse.com.au  Fact Sheet for Healthcare Providers: IncredibleEmployment.be  This test is not yet approved or cleared by the [place] FDA and has been authorized for detection and/or diagnosis of SARS-CoV-2 by FDA under an Emergency Use Authorization (EUA). This EUA will remain in effect (meaning this test can be used) for the duration of the COVID-19 declaration under Section 564(b)(1) of the Act, 21 U.S.C. section 360bbb-3(b)(1), unless the authorization is terminated or revoked.  Performed at [facility], [address] [place], [place] [address]   Urinalysis, Routine w reflex microscopic Urine, Clean Catch     Status: Abnormal   Collection Time: 07/19/20 11:10 PM  Result Value Ref Range   Color, Urine YELLOW (A) YELLOW   APPearance CLEAR (A) CLEAR   Specific Gravity, Urine 1.010 1.005 - 1.030   pH 6.0 5.0 - 8.0   Glucose, UA NEGATIVE NEGATIVE mg/dL   Hgb urine dipstick MODERATE (A) NEGATIVE   Bilirubin Urine NEGATIVE NEGATIVE   Ketones, ur NEGATIVE NEGATIVE mg/dL   Protein, ur NEGATIVE NEGATIVE mg/dL   Nitrite NEGATIVE NEGATIVE   Leukocytes,Ua NEGATIVE NEGATIVE   RBC / HPF 0-5 0 - 5 RBC/hpf   WBC, UA 0-5 0 - 5 WBC/hpf   Bacteria, UA NONE SEEN NONE SEEN   Squamous Epithelial / LPF NONE SEEN 0 - 5   Mucus PRESENT     Comment: Performed at [facility], [address] [place], [place] [address]  Osmolality, urine     Status: None   Collection Time: 07/19/20 11:10 PM  Result Value Ref Range   Osmolality, Ur 362  300 - 900 mOsm/kg    Comment: Performed at [facility], [address] [place], [place] [address]  Sodium, urine, random     Status: None   Collection Time: 07/19/20 11:10 PM  Result Value Ref Range   Sodium, Ur 55 mmol/L    Comment: Performed at [facility], [address] [place], [place] [address]  Magnesium     Status: None   Collection Time: 07/20/20 12:39 AM  Result Value Ref Range   Magnesium 1.8 1.7 - 2.4 mg/dL    Comment: Performed at [facility], [address] [place], [place] [address]  Phosphorus     Status: None   Collection Time: 07/20/20 12:39 AM  Result Value Ref Range   Phosphorus 3.3 2.5 - 4.6 mg/dL    Comment: Performed at [facility], [address] [place], [place] [address]  Comprehensive metabolic panel     Status: Abnormal   Collection Time: 07/20/20 12:39 AM  Result Value Ref Range   Sodium 125 (L) 135 - 145 mmol/L   Potassium 4.2 3.5 - 5.1 mmol/L   Chloride 90 (L) 98 - 111 mmol/L   CO2 24 22 - 32 mmol/L   Glucose, Bld 111 (H) 70 - 99 mg/dL    Comment: Glucose reference range applies only to samples taken after fasting for at least 8 hours.   BUN 7 (L) 8 - 23 mg/dL   Creatinine, Ser 0.55 (L) 0.61 - 1.24 mg/dL   Calcium 8.6 (L) 8.9 - 10.3 mg/dL   Total Protein 7.2 6.5 - 8.1 g/dL  Albumin 3.9 3.5 - 5.0 g/dL   AST 79 (H) 15 - 41 U/L   ALT 30 0 - 44 U/L   Alkaline Phosphatase 70 38 - 126 U/L   Total Bilirubin 0.9 0.3 - 1.2 mg/dL   GFR, Estimated >60 >60 mL/min    Comment: (NOTE) Calculated using the CKD-EPI Creatinine Equation (2021)    Anion gap 11 5 - 15    Comment: Performed at [facility], [address] [place], [place] [address]  CBC     Status: Abnormal   Collection Time: 07/20/20 12:39 AM  Result Value Ref Range   WBC 14.9 (H) 4.0 - 10.5 K/uL   RBC 4.04 (L) 4.22 - 5.81 MIL/uL   Hemoglobin 13.5 13.0 - 17.0 g/dL   HCT 38.3 (L) 39.0 - 52.0 %   MCV 94.8 80.0 - 100.0 fL   MCH 33.4  26.0 - 34.0 pg   MCHC 35.2 30.0 - 36.0 g/dL   RDW 11.5 11.5 - 15.5 %   Platelets 301 150 - 400 K/uL   nRBC 0.0 0.0 - 0.2 %    Comment: Performed at [facility], [address] [place], [place] [address]  Protime-INR     Status: None   Collection Time: 07/20/20 12:39 AM  Result Value Ref Range   Prothrombin Time 13.6 11.4 - 15.2 seconds   INR 1.0 0.8 - 1.2    Comment: (NOTE) INR goal varies based on device and disease states. Performed at [facility], [address] [place], [place] [address]   Ethanol     Status: None   Collection Time: 07/20/20 12:39 AM  Result Value Ref Range   Alcohol, Ethyl (B) <10 <10 mg/dL    Comment: (NOTE) Lowest detectable limit for serum alcohol is 10 mg/dL.  For medical purposes only. Performed at [facility], [address] [place], [place] [address]   TSH     Status: None   Collection Time: 07/20/20 12:39 AM  Result Value Ref Range   TSH 2.664 0.350 - 4.500 uIU/mL    Comment: Performed by a 3rd Generation assay with a functional sensitivity of <=0.01 uIU/mL. Performed at [facility], [address] [place], [place] [address]   Osmolality     Status: Abnormal   Collection Time: 07/20/20 12:39 AM  Result Value Ref Range   Osmolality 258 (L) 275 - 295 mOsm/kg    Comment: Performed at [facility], [address] [place], [place] [address]  Calcium, ionized     Status: None   Collection Time: 07/20/20  9:03 AM  Result Value Ref Range   Calcium, Ionized, Serum 4.7 4.5 - 5.6 mg/dL    Comment: (NOTE) Performed At: [facility] [place], [place] [id number] Rush Farmer MD [id number]   HIV Antibody (routine testing w rflx)     Status: None   Collection Time: 07/20/20  9:03 AM  Result Value Ref Range   HIV Screen 4th Generation wRfx Non Reactive Non Reactive    Comment: Performed at [facility], [address], [place], [place] [address]  I-STAT, [place] 8      Status: Abnormal   Collection Time: 07/20/20  3:54 PM  Result Value Ref Range   Sodium 127 (L) 135 - 145 mmol/L   Potassium 4.3 3.5 - 5.1 mmol/L   Chloride 95 (L) 98 - 111 mmol/L   BUN 6 (L) 8 - 23 mg/dL   Creatinine,  Ser 0.50 (L) 0.61 - 1.24 mg/dL   Glucose, Bld 96 70 - 99 mg/dL    Comment: Glucose reference range applies only to samples taken after fasting for at least 8 hours.   Calcium, Ion 1.12 (L) 1.15 - 1.40 mmol/L   TCO2 24 22 - 32 mmol/L   Hemoglobin 13.6 13.0 - 17.0 g/dL   HCT 40.0 39.0 - 52.0 %  CBC     Status: Abnormal   Collection Time: 07/21/20  5:16 AM  Result Value Ref Range   WBC 11.0 (H) 4.0 - 10.5 K/uL   RBC 3.73 (L) 4.22 - 5.81 MIL/uL   Hemoglobin 12.5 (L) 13.0 - 17.0 g/dL   HCT 35.4 (L) 39.0 - 52.0 %   MCV 94.9 80.0 - 100.0 fL   MCH 33.5 26.0 - 34.0 pg   MCHC 35.3 30.0 - 36.0 g/dL   RDW 11.6 11.5 - 15.5 %   Platelets 302 150 - 400 K/uL   nRBC 0.0 0.0 - 0.2 %    Comment: Performed at [facility], [address], [place], [place] [id number]  Basic metabolic panel     Status: Abnormal   Collection Time: 07/21/20  5:16 AM  Result Value Ref Range   Sodium 131 (L) 135 - 145 mmol/L   Potassium 4.4 3.5 - 5.1 mmol/L   Chloride 99 98 - 111 mmol/L   CO2 24 22 - 32 mmol/L   Glucose, Bld 152 (H) 70 - 99 mg/dL    Comment: Glucose reference range applies only to samples taken after fasting for at least 8 hours.   BUN 9 8 - 23 mg/dL   Creatinine, Ser 0.56 (L) 0.61 - 1.24 mg/dL   Calcium 8.5 (L) 8.9 - 10.3 mg/dL   GFR, Estimated >60 >60 mL/min    Comment: (NOTE) Calculated using the CKD-EPI Creatinine Equation (2021)    Anion gap 8 5 - 15    Comment: Performed at [facility], [address] [place], [place] [address]     PHQ2/9: Depression screen [facility] 2/9 10/13/2020 12/01/2019 12/01/2019 09/25/2017 06/25/2017  Decreased Interest 0 2 0 0 0  Down, Depressed, Hopeless 1 1 0 3 0  PHQ - 2 Score 1 3 0 3 0  Altered sleeping 1 0 - 0 -  Tired,  decreased energy 0 1 - 3 -  Change in appetite 0 1 - 1 -  Feeling bad or failure about yourself  1 0 - 0 -  Trouble concentrating 0 0 - 0 -  Moving slowly or fidgety/restless 0 0 - 0 -  Suicidal thoughts 0 0 - 0 -  PHQ-9 Score 3 5 - 7 -  Difficult doing work/chores - Somewhat difficult - Not difficult at all -    phq 9 is positive   Fall Risk: Fall Risk  10/13/2020 12/01/2019 09/25/2017 06/25/2017  Falls in the past year? 1 0 Yes No  Number falls in past yr: 0 0 1 -  Injury with Fall? 1 0 No -      Functional Status Survey: Is the patient deaf or have difficulty hearing?: No Does the patient have difficulty seeing, even when wearing glasses/contacts?: No Does the patient have difficulty concentrating, remembering, or making decisions?: No Does the patient have difficulty walking or climbing stairs?: No Does the patient have difficulty dressing or bathing?: Yes Does the patient have difficulty doing errands alone such as visiting a doctor's office or shopping?: Yes    Assessment &  Plan  1. Dilation of aorta (HCC)  Found on CT done after fall, discussed results and need for follow up - COMPLETE METABOLIC PANEL WITH GFR - Ambulatory referral to Vascular Surgery  2. Aortic atherosclerosis ([place])  - Ambulatory referral to Vascular Surgery  3. Dyslipidemia   4. Senile purpura (HCC)  Arms , reassurance given   5. Mucopurulent chronic bronchitis ([place])  Cannot afford medication  6. Paroxysmal atrial fibrillation ([place])  - Ambulatory referral to Cardiology  7. Hyperglycemia   8. Tobacco use  Not ready to quit   9. History of fall  - Ambulatory referral to Cardiology - Ambulatory referral to Neurology  10. History of wrist fracture   11. Hyponatremia  - COMPLETE METABOLIC PANEL WITH GFR  12. Anemia, unspecified type  - CBC with Differential/Platelet - Iron, TIBC and Ferritin Panel .

## 2020-10-13 ENCOUNTER — Ambulatory Visit (INDEPENDENT_AMBULATORY_CARE_PROVIDER_SITE_OTHER): Payer: PRIVATE HEALTH INSURANCE | Admitting: Family Medicine

## 2020-10-13 ENCOUNTER — Other Ambulatory Visit: Payer: Self-pay

## 2020-10-13 VITALS — BP 128/72 | HR 94 | Temp 98.2°F | Resp 16 | Ht 70.0 in | Wt 134.0 lb

## 2020-10-13 DIAGNOSIS — I700 Atherosclerosis of aorta: Secondary | ICD-10-CM | POA: Diagnosis not present

## 2020-10-13 DIAGNOSIS — I480 Paroxysmal atrial fibrillation: Secondary | ICD-10-CM

## 2020-10-13 DIAGNOSIS — Z9181 History of falling: Secondary | ICD-10-CM

## 2020-10-13 DIAGNOSIS — Z8781 Personal history of (healed) traumatic fracture: Secondary | ICD-10-CM

## 2020-10-13 DIAGNOSIS — E871 Hypo-osmolality and hyponatremia: Secondary | ICD-10-CM

## 2020-10-13 DIAGNOSIS — D692 Other nonthrombocytopenic purpura: Secondary | ICD-10-CM

## 2020-10-13 DIAGNOSIS — R739 Hyperglycemia, unspecified: Secondary | ICD-10-CM

## 2020-10-13 DIAGNOSIS — E785 Hyperlipidemia, unspecified: Secondary | ICD-10-CM | POA: Diagnosis not present

## 2020-10-13 DIAGNOSIS — I77819 Aortic ectasia, unspecified site: Secondary | ICD-10-CM | POA: Diagnosis not present

## 2020-10-13 DIAGNOSIS — J411 Mucopurulent chronic bronchitis: Secondary | ICD-10-CM

## 2020-10-13 DIAGNOSIS — Z720 Tobacco use: Secondary | ICD-10-CM

## 2020-10-13 DIAGNOSIS — D649 Anemia, unspecified: Secondary | ICD-10-CM

## 2020-10-14 LAB — COMPLETE METABOLIC PANEL WITH GFR
AG Ratio: 1.6 (calc) (ref 1.0–2.5)
ALT: 27 U/L (ref 9–46)
AST: 33 U/L (ref 10–35)
Albumin: 4.6 g/dL (ref 3.6–5.1)
Alkaline phosphatase (APISO): 115 U/L (ref 35–144)
BUN/Creatinine Ratio: 7 (calc) (ref 6–22)
BUN: 5 mg/dL — ABNORMAL LOW (ref 7–25)
CO2: 26 mmol/L (ref 20–32)
Calcium: 9.7 mg/dL (ref 8.6–10.3)
Chloride: 97 mmol/L — ABNORMAL LOW (ref 98–110)
Creat: 0.67 mg/dL — ABNORMAL LOW (ref 0.70–1.35)
Globulin: 2.9 g/dL (calc) (ref 1.9–3.7)
Glucose, Bld: 93 mg/dL (ref 65–99)
Potassium: 4.5 mmol/L (ref 3.5–5.3)
Sodium: 133 mmol/L — ABNORMAL LOW (ref 135–146)
Total Bilirubin: 0.6 mg/dL (ref 0.2–1.2)
Total Protein: 7.5 g/dL (ref 6.1–8.1)
eGFR: 106 mL/min/{1.73_m2} (ref >=60)

## 2020-10-14 LAB — IRON,TIBC AND FERRITIN PANEL
%SAT: 36 % (calc) (ref 20–48)
Ferritin: 184 ng/mL (ref 24–380)
Iron: 114 ug/dL (ref 50–180)
TIBC: 313 mcg/dL (calc) (ref 250–425)

## 2020-10-14 LAB — CBC WITH DIFFERENTIAL/PLATELET
Absolute Monocytes: 909 cells/uL (ref 200–950)
Basophils Absolute: 50 cells/uL (ref 0–200)
Basophils Relative: 0.7 %
Eosinophils Absolute: 149 cells/uL (ref 15–500)
Eosinophils Relative: 2.1 %
HCT: 48.1 % (ref 38.5–50.0)
Hemoglobin: 16.4 g/dL (ref 13.2–17.1)
Lymphs Abs: 1541 cells/uL (ref 850–3900)
MCH: 32.2 pg (ref 27.0–33.0)
MCHC: 34.1 g/dL (ref 32.0–36.0)
MCV: 94.3 fL (ref 80.0–100.0)
MPV: 8.9 fL (ref 7.5–12.5)
Monocytes Relative: 12.8 %
Neutro Abs: 4452 cells/uL (ref 1500–7800)
Neutrophils Relative %: 62.7 %
Platelets: 294 10*3/uL (ref 140–400)
RBC: 5.1 10*6/uL (ref 4.20–5.80)
RDW: 12.8 % (ref 11.0–15.0)
Total Lymphocyte: 21.7 %
WBC: 7.1 10*3/uL (ref 3.8–10.8)

## 2020-10-15 ENCOUNTER — Encounter: Payer: Self-pay | Admitting: Family Medicine

## 2020-10-15 ENCOUNTER — Other Ambulatory Visit: Payer: Self-pay | Admitting: Family Medicine

## 2020-10-15 DIAGNOSIS — E871 Hypo-osmolality and hyponatremia: Secondary | ICD-10-CM

## 2020-11-06 ENCOUNTER — Other Ambulatory Visit: Payer: Self-pay | Admitting: Orthopedic Surgery

## 2020-11-27 ENCOUNTER — Other Ambulatory Visit
Admission: RE | Admit: 2020-11-27 | Discharge: 2020-11-27 | Disposition: A | Discharge status: Home / Self Care | Payer: PRIVATE HEALTH INSURANCE | Source: Ambulatory Visit | Attending: Orthopedic Surgery | Admitting: Orthopedic Surgery

## 2020-11-27 ENCOUNTER — Other Ambulatory Visit: Payer: Self-pay

## 2020-11-27 HISTORY — DX: Cardiac arrhythmia, unspecified: I49.9

## 2020-11-27 NOTE — Patient Instructions (Signed)
Your procedure is scheduled on: Thursday December 07, 2020. Report to Day Surgery inside [facility] 2nd floor stop by admissions desk first before getting on elevator.  To find out your arrival time please call [phone] between 1PM - 3PM on Wednesday December 06, 2020.  Remember: Instructions that are not followed completely may result in serious medical risk,  up to and including death, or upon the discretion of your surgeon and anesthesiologist your  surgery may need to be rescheduled.     _X__ 1. Do not eat food after midnight the night before your procedure.                 No chewing gum or hard candies. You may drink clear liquids up to 2 hours                 before you are scheduled to arrive for your surgery- DO not drink clear                 liquids within 2 hours of the start of your surgery.                 Clear Liquids include:  water, apple juice without pulp, clear Gatorade, G2 or                  Gatorade Zero (avoid Red/Purple/Blue), Black Coffee or Tea (Do not add                 anything to coffee or tea).  __X__2.   Complete the "Ensure Clear Pre-surgery Clear Carbohydrate Drink" provided to you, 2 hours before arrival. **If you are diabetic you will be provided with an alternative drink, Gatorade Zero or G2.  __X__3.  On the morning of surgery brush your teeth with toothpaste and water, you                may rinse your mouth with mouthwash if you wish.  Do not swallow any toothpaste or mouthwash.     _X__ 4.  No Alcohol for 24 hours before or after surgery.   _X__ 5.  Do Not Smoke or use e-cigarettes For 24 Hours Prior to Your Surgery.                 Do not use any chewable tobacco products for at least 6 hours prior to                 Surgery.  _X__  6.  Do not use any recreational drugs (marijuana, cocaine, heroin, ecstasy, MDMA or other)                For at least one week prior to your surgery.  Combination of these drugs with  anesthesia                May have life threatening results.  __X__7.  Notify your doctor if there is any change in your medical condition      (cold, fever, infections).     Do not wear jewelry, make-up, hairpins, clips or nail polish. Do not wear lotions, powders, or perfumes. You may wear deodorant. Do not shave 48 hours prior to surgery. Men may shave face and neck. Do not bring valuables to the hospital.    [facility] is not responsible for any belongings or valuables.  Contacts, dentures or bridgework may not be worn into surgery. Leave your suitcase in the car. After surgery  it may be brought to your room. For patients admitted to the hospital, discharge time is determined by your treatment team.   Patients discharged the day of surgery will not be allowed to drive home.   Make arrangements for someone to be with you for the first 24 hours of your Same Day Discharge.   __X__ Take these medicines the morning of surgery with A SIP OF WATER:    1. None   2.   3.   4.  5.  6.  ____ Fleet Enema (as directed)   __X__ Use CHG Soap (or wipes) as directed  ____ Use Benzoyl Peroxide Gel as instructed  ____ Use inhalers on the day of surgery  ____ Stop metformin 2 days prior to surgery    ____ Take 1/2 of usual insulin dose the night before surgery. No insulin the morning          of surgery.   ____ Call your PCP, cardiologist, or Pulmonologist if taking Coumadin/Plavix/aspirin and ask when to stop before your surgery.   __X__ One Week prior to surgery- Stop Anti-inflammatories such as Ibuprofen, Aleve, Advil, Motrin, meloxicam (MOBIC), diclofenac, etodolac, ketorolac, Toradol, Daypro, piroxicam, Goody's or BC powders. OK TO USE TYLENOL IF NEEDED   __X__ Stop supplements until after surgery.    ____ Bring C-Pap to the hospital.    If you have any questions regarding your pre-procedure instructions,  Please call Pre-admit Testing at [phone]

## 2020-12-06 MED ORDER — LACTATED RINGERS IV SOLN: INTRAVENOUS | Status: DC

## 2020-12-06 MED ORDER — CEFAZOLIN SODIUM-DEXTROSE 2-4 GM/100ML-% IV SOLN
2.0000 g | INTRAVENOUS | Status: AC
  Administered 2020-12-07: 2 g via INTRAVENOUS

## 2020-12-06 MED ORDER — FAMOTIDINE 20 MG PO TABS: 20.0000 mg | ORAL_TABLET | Freq: Once | ORAL | Status: AC

## 2020-12-06 MED ORDER — CHLORHEXIDINE GLUCONATE 0.12 % MT SOLN: 15.0000 mL | Freq: Once | OROMUCOSAL | Status: AC

## 2020-12-06 MED ORDER — ORAL CARE MOUTH RINSE: 15.0000 mL | Freq: Once | OROMUCOSAL | Status: AC

## 2020-12-07 ENCOUNTER — Ambulatory Visit: Payer: PRIVATE HEALTH INSURANCE | Admitting: Registered Nurse

## 2020-12-07 ENCOUNTER — Ambulatory Visit: Payer: PRIVATE HEALTH INSURANCE

## 2020-12-07 ENCOUNTER — Ambulatory Visit
Admission: RE | Admit: 2020-12-07 | Discharge: 2020-12-07 | Disposition: A | Discharge status: Home / Self Care | Payer: PRIVATE HEALTH INSURANCE | Attending: Orthopedic Surgery | Admitting: Orthopedic Surgery

## 2020-12-07 ENCOUNTER — Encounter: Payer: Self-pay | Admitting: Orthopedic Surgery

## 2020-12-07 ENCOUNTER — Encounter
Admission: RE | Disposition: A | Discharge status: Home / Self Care | Payer: Self-pay | Source: Home / Self Care | Attending: Orthopedic Surgery

## 2020-12-07 ENCOUNTER — Other Ambulatory Visit: Payer: Self-pay

## 2020-12-07 DIAGNOSIS — Z8781 Personal history of (healed) traumatic fracture: Secondary | ICD-10-CM | POA: Diagnosis not present

## 2020-12-07 DIAGNOSIS — Y798 Miscellaneous orthopedic devices associated with adverse incidents, not elsewhere classified: Secondary | ICD-10-CM | POA: Insufficient documentation

## 2020-12-07 DIAGNOSIS — Z9889 Other specified postprocedural states: Secondary | ICD-10-CM | POA: Insufficient documentation

## 2020-12-07 DIAGNOSIS — M25532 Pain in left wrist: Secondary | ICD-10-CM | POA: Insufficient documentation

## 2020-12-07 DIAGNOSIS — M659 Synovitis and tenosynovitis, unspecified: Secondary | ICD-10-CM | POA: Insufficient documentation

## 2020-12-07 DIAGNOSIS — T8484XA Pain due to internal orthopedic prosthetic devices, implants and grafts, initial encounter: Secondary | ICD-10-CM | POA: Insufficient documentation

## 2020-12-07 DIAGNOSIS — M25531 Pain in right wrist: Secondary | ICD-10-CM | POA: Diagnosis not present

## 2020-12-07 DIAGNOSIS — Z79899 Other long term (current) drug therapy: Secondary | ICD-10-CM | POA: Diagnosis not present

## 2020-12-07 DIAGNOSIS — F17200 Nicotine dependence, unspecified, uncomplicated: Secondary | ICD-10-CM | POA: Insufficient documentation

## 2020-12-07 HISTORY — PX: HARDWARE REMOVAL: SHX979

## 2020-12-07 SURGERY — REMOVAL, HARDWARE
Anesthesia: General | Site: Wrist | Laterality: Bilateral

## 2020-12-07 MED ORDER — ACETAMINOPHEN 10 MG/ML IV SOLN
INTRAVENOUS | Status: DC | PRN
  Administered 2020-12-07: 1000 mg via INTRAVENOUS

## 2020-12-07 MED ORDER — LIDOCAINE HCL (CARDIAC) PF 100 MG/5ML IV SOSY
PREFILLED_SYRINGE | INTRAVENOUS | Status: DC | PRN
  Administered 2020-12-07: 40 mg via INTRAVENOUS
  Administered 2020-12-07: 60 mg via INTRAVENOUS

## 2020-12-07 MED ORDER — MIDAZOLAM HCL 2 MG/2ML IJ SOLN
INTRAMUSCULAR | Status: AC
  Filled 2020-12-07: qty 2

## 2020-12-07 MED ORDER — FENTANYL CITRATE (PF) 100 MCG/2ML IJ SOLN
INTRAMUSCULAR | Status: AC
  Filled 2020-12-07: qty 2

## 2020-12-07 MED ORDER — HYDROCODONE-ACETAMINOPHEN 5-325 MG PO TABS: 1.0000 | ORAL_TABLET | Freq: Four times a day (QID) | ORAL | 0 refills | Status: AC | PRN

## 2020-12-07 MED ORDER — 0.9 % SODIUM CHLORIDE (POUR BTL) OPTIME
TOPICAL | Status: DC | PRN
  Administered 2020-12-07: 150 mL

## 2020-12-07 MED ORDER — FENTANYL CITRATE (PF) 100 MCG/2ML IJ SOLN
INTRAMUSCULAR | Status: DC | PRN
  Administered 2020-12-07 (×2): 25 ug via INTRAVENOUS

## 2020-12-07 MED ORDER — DEXAMETHASONE SODIUM PHOSPHATE 10 MG/ML IJ SOLN
INTRAMUSCULAR | Status: AC
  Filled 2020-12-07: qty 1

## 2020-12-07 MED ORDER — ACETAMINOPHEN 10 MG/ML IV SOLN
INTRAVENOUS | Status: AC
  Filled 2020-12-07: qty 100

## 2020-12-07 MED ORDER — BUPIVACAINE HCL (PF) 0.5 % IJ SOLN
INTRAMUSCULAR | Status: AC
  Filled 2020-12-07: qty 30

## 2020-12-07 MED ORDER — BUPIVACAINE HCL (PF) 0.5 % IJ SOLN
INTRAMUSCULAR | Status: DC | PRN
  Administered 2020-12-07: 20 mL

## 2020-12-07 MED ORDER — FENTANYL CITRATE (PF) 100 MCG/2ML IJ SOLN
25.0000 ug | INTRAMUSCULAR | Status: DC | PRN
  Administered 2020-12-07 (×4): 25 ug via INTRAVENOUS

## 2020-12-07 MED ORDER — MIDAZOLAM HCL 2 MG/2ML IJ SOLN
INTRAMUSCULAR | Status: DC | PRN
  Administered 2020-12-07: 2 mg via INTRAVENOUS

## 2020-12-07 MED ORDER — PROPOFOL 10 MG/ML IV BOLUS
INTRAVENOUS | Status: DC | PRN
  Administered 2020-12-07: 120 mg via INTRAVENOUS

## 2020-12-07 MED ORDER — CHLORHEXIDINE GLUCONATE 0.12 % MT SOLN
OROMUCOSAL | Status: AC
  Administered 2020-12-07: 15 mL via OROMUCOSAL
  Filled 2020-12-07: qty 15

## 2020-12-07 MED ORDER — ONDANSETRON HCL 4 MG/2ML IJ SOLN
INTRAMUSCULAR | Status: AC
  Filled 2020-12-07: qty 2

## 2020-12-07 MED ORDER — KETAMINE HCL 50 MG/5ML IJ SOSY
PREFILLED_SYRINGE | INTRAMUSCULAR | Status: AC
  Filled 2020-12-07: qty 5

## 2020-12-07 MED ORDER — DEXAMETHASONE SODIUM PHOSPHATE 10 MG/ML IJ SOLN
INTRAMUSCULAR | Status: DC | PRN
  Administered 2020-12-07: 6 mg via INTRAVENOUS

## 2020-12-07 MED ORDER — PHENYLEPHRINE HCL (PRESSORS) 10 MG/ML IV SOLN
INTRAVENOUS | Status: DC | PRN
  Administered 2020-12-07: 50 ug via INTRAVENOUS

## 2020-12-07 MED ORDER — HYDROCODONE-ACETAMINOPHEN 5-325 MG PO TABS
1.0000 | ORAL_TABLET | Freq: Once | ORAL | Status: AC
  Administered 2020-12-07: 1 via ORAL

## 2020-12-07 MED ORDER — HYDROCODONE-ACETAMINOPHEN 5-325 MG PO TABS
ORAL_TABLET | ORAL | Status: AC
  Filled 2020-12-07: qty 1

## 2020-12-07 MED ORDER — CEFAZOLIN SODIUM-DEXTROSE 2-4 GM/100ML-% IV SOLN
INTRAVENOUS | Status: AC
  Filled 2020-12-07: qty 100

## 2020-12-07 MED ORDER — LIDOCAINE HCL (PF) 2 % IJ SOLN
INTRAMUSCULAR | Status: AC
  Filled 2020-12-07: qty 5

## 2020-12-07 MED ORDER — FAMOTIDINE 20 MG PO TABS
ORAL_TABLET | ORAL | Status: AC
  Administered 2020-12-07: 20 mg via ORAL
  Filled 2020-12-07: qty 1

## 2020-12-07 MED ORDER — ONDANSETRON HCL 4 MG/2ML IJ SOLN: 4.0000 mg | Freq: Once | INTRAMUSCULAR | Status: DC | PRN

## 2020-12-07 MED ORDER — NEOMYCIN-POLYMYXIN B GU 40-200000 IR SOLN
Status: DC | PRN
  Administered 2020-12-07: 2 mL

## 2020-12-07 MED ORDER — KETAMINE HCL 10 MG/ML IJ SOLN
INTRAMUSCULAR | Status: DC | PRN
  Administered 2020-12-07: 20 mg via INTRAVENOUS

## 2020-12-07 MED ORDER — ONDANSETRON HCL 4 MG/2ML IJ SOLN
INTRAMUSCULAR | Status: DC | PRN
  Administered 2020-12-07: 4 mg via INTRAVENOUS

## 2020-12-07 SURGICAL SUPPLY — 45 items
APL PRP STRL LF DISP 70% ISPRP (MISCELLANEOUS) ×3
CHLORAPREP W/TINT 26 (MISCELLANEOUS) ×6 IMPLANT
CUFF TOURN SGL QUICK 24 (TOURNIQUET CUFF)
CUFF TOURN SGL QUICK 34 (TOURNIQUET CUFF)
CUFF TRNQT CYL 24X4X16.5-23 (TOURNIQUET CUFF) IMPLANT
CUFF TRNQT CYL 34X4.125X (TOURNIQUET CUFF) IMPLANT
DRAPE C-ARM XRAY 36X54 (DRAPES) IMPLANT
DRAPE C-ARMOR (DRAPES) IMPLANT
DRAPE EXTREMITY T 121X128X90 (DISPOSABLE) ×2 IMPLANT
DRAPE INCISE IOBAN 66X45 STRL (DRAPES) IMPLANT
DRSG EMULSION OIL 3X8 NADH (GAUZE/BANDAGES/DRESSINGS) IMPLANT
ELECT CAUTERY BLADE 6.4 (BLADE) ×2 IMPLANT
ELECT REM PT RETURN 9FT ADLT (ELECTROSURGICAL) ×2
ELECTRODE REM PT RTRN 9FT ADLT (ELECTROSURGICAL) ×1 IMPLANT
GAUZE 4X4 16PLY ~~LOC~~+RFID DBL (SPONGE) ×4 IMPLANT
GAUZE SPONGE 4X4 12PLY STRL (GAUZE/BANDAGES/DRESSINGS) ×2 IMPLANT
GAUZE XEROFORM 1X8 LF (GAUZE/BANDAGES/DRESSINGS) ×2 IMPLANT
GLOVE SURG SYN 9.0  PF PI (GLOVE) ×1
GLOVE SURG SYN 9.0 PF PI (GLOVE) ×1 IMPLANT
GOWN SRG 2XL LVL 4 RGLN SLV (GOWNS) ×1 IMPLANT
GOWN STRL NON-REIN 2XL LVL4 (GOWNS) ×2
GOWN STRL REUS W/ TWL LRG LVL3 (GOWN DISPOSABLE) ×1 IMPLANT
GOWN STRL REUS W/TWL LRG LVL3 (GOWN DISPOSABLE) ×2
HANDLE YANKAUER SUCT BULB TIP (MISCELLANEOUS) ×2 IMPLANT
KIT TURNOVER KIT A (KITS) ×2 IMPLANT
MANIFOLD NEPTUNE II (INSTRUMENTS) ×2 IMPLANT
NEEDLE FILTER BLUNT 18X 1/2SAF (NEEDLE) ×1
NEEDLE FILTER BLUNT 18X1 1/2 (NEEDLE) ×1 IMPLANT
NS IRRIG 1000ML POUR BTL (IV SOLUTION) ×2 IMPLANT
PACK EXTREMITY ARMC (MISCELLANEOUS) ×2 IMPLANT
PAD ABD DERMACEA PRESS 5X9 (GAUZE/BANDAGES/DRESSINGS) ×8 IMPLANT
SCALPEL PROTECTED #15 DISP (BLADE) ×4 IMPLANT
STAPLER SKIN PROX 35W (STAPLE) ×2 IMPLANT
SUT ETHIBOND NAB CT1 #1 30IN (SUTURE) IMPLANT
SUT ETHILON 3-0 FS-10 30 BLK (SUTURE)
SUT ETHILON 4-0 (SUTURE) ×4
SUT ETHILON 4-0 FS2 18XMFL BLK (SUTURE) ×2
SUT VIC AB 0 CT1 36 (SUTURE) IMPLANT
SUT VIC AB 2-0 CT1 27 (SUTURE)
SUT VIC AB 2-0 CT1 TAPERPNT 27 (SUTURE) IMPLANT
SUTURE EHLN 3-0 FS-10 30 BLK (SUTURE) IMPLANT
SUTURE ETHLN 4-0 FS2 18XMF BLK (SUTURE) ×2 IMPLANT
SYR 10ML LL (SYRINGE) ×2 IMPLANT
WATER STERILE IRR 1000ML POUR (IV SOLUTION) ×2 IMPLANT
WATER STERILE IRR 500ML POUR (IV SOLUTION) ×2 IMPLANT

## 2020-12-07 NOTE — Anesthesia Procedure Notes (Signed)
Procedure Name: LMA Insertion Date/Time: 12/07/2020 11:59 AM Performed by: Lia Foyer, CRNA Pre-anesthesia Checklist: Patient identified, Emergency Drugs available, Suction available and Patient being monitored Patient Re-evaluated:Patient Re-evaluated prior to induction Oxygen Delivery Method: Circle system utilized Preoxygenation: Pre-oxygenation with 100% oxygen Induction Type: IV induction Ventilation: Mask ventilation without difficulty LMA: LMA flexible inserted LMA Size: 4.5 Tube type: Oral Number of attempts: 1 Airway Equipment and Method: Oral airway Placement Confirmation: positive ETCO2 and breath sounds checked- equal and bilateral Tube secured with: Tape Dental Injury: Teeth and Oropharynx as per pre-operative assessment

## 2020-12-07 NOTE — Anesthesia Preprocedure Evaluation (Signed)
Anesthesia Evaluation  Patient identified by MRN, date of birth, ID band Patient awake    Reviewed: Allergy & Precautions, H&P , NPO status , Patient's Chart, lab work & pertinent test results, reviewed documented beta blocker date and time   Airway Mallampati: II  TM Distance: >3 FB Neck ROM: full    Dental  (+) Teeth Intact   Pulmonary pneumonia, Current Smoker and Patient abstained from smoking.,    Pulmonary exam normal        Cardiovascular Exercise Tolerance: Good hypertension, Normal cardiovascular exam+ dysrhythmias  Rate:Normal     Neuro/Psych negative neurological ROS  negative psych ROS   GI/Hepatic negative GI ROS, Neg liver ROS,   Endo/Other  negative endocrine ROS  Renal/GU negative Renal ROS  negative genitourinary   Musculoskeletal   Abdominal   Peds  Hematology  (+) Blood dyscrasia, anemia ,   Anesthesia Other Findings   Reproductive/Obstetrics negative OB ROS                             Anesthesia Physical Anesthesia Plan  ASA: 3  Anesthesia Plan: General LMA   Post-op Pain Management:    Induction:   PONV Risk Score and Plan: 3  Airway Management Planned:   Additional Equipment:   Intra-op Plan:   Post-operative Plan:   Informed Consent: I have reviewed the patients History and Physical, chart, labs and discussed the procedure including the risks, benefits and alternatives for the proposed anesthesia with the patient or authorized representative who has indicated his/her understanding and acceptance.       Plan Discussed with: CRNA  Anesthesia Plan Comments:         Anesthesia Quick Evaluation

## 2020-12-07 NOTE — Op Note (Signed)
12/07/2020  12:58 PM  PATIENT:  Kristopher [name]  60 y.o. male  PRE-OPERATIVE DIAGNOSIS:  S/P ORIF fracture  Z98.890, Z87.81 Acute pain of both wrists M25.531, M25.532 Painful orthopedic  hardware T84.84.XA  POST-OPERATIVE DIAGNOSIS:  S/P ORIF fracture  Z98.890, Z87.81 Acute pain of both wrists M25.531, M25.532 Painful orthopedic  hardware T84.84.XA  PROCEDURE:  Procedure(s): HARDWARE REMOVAL-Bilateral DVR plate removal, Bilateral wrists (Bilateral)  SURGEON: Laurene Footman, MD  ASSISTANTS: None  ANESTHESIA:   general  EBL:  Total I/O In: 700 [I.V.:500; IV Piggyback:200] Out: -   BLOOD ADMINISTERED:none  DRAINS: none   LOCAL MEDICATIONS USED:  MARCAINE     SPECIMEN:  No Specimen  DISPOSITION OF SPECIMEN:  N/A  COUNTS:  YES  TOURNIQUET:   Total Tourniquet Time Documented: Forearm (N/A) - 8 minutes Forearm (N/A) - 11 minutes Total: Forearm (N/A) - 19 minutes   IMPLANTS: None  DICTATION: .Dragon Dictation patient brought the operating room and after adequate general anesthesia was obtained both arms were prepped and draped in the usual sterile fashion with a tourniquet was applied just below the elbow.  After patient identification timeout procedures were completed tourniquet was raised at the left forearm tourniquet and going through the prior incision the skin and subcutaneous tissue incised.  The FCR tendon sheath was incised the tendon retracted radially protect the radial artery and associated veins.  There is scar tissue over the plate and this was elevated and there was some synovitis tenosynovitis present.  The multidirectional screws were slightly prominent and this was probably the cause of this all screws removed and then the wound was irrigated tourniquet let down and the wound closed with simple erupted 4-0 nylon skin suture with 10 cc half percent Sensorcaine placed for postop analgesia.  The right arm was approached in identical fashion with again scar tissue  noted around the plate volarly there were no complications and local was infiltrated on the right side as well dressings of Xeroform 4 x 4 web roll and Ace wrap applied.  PLAN OF CARE: Discharge to home after PACU  PATIENT DISPOSITION:  PACU - hemodynamically stable.

## 2020-12-07 NOTE — Transfer of Care (Signed)
Immediate Anesthesia Transfer of Care Note  Patient: [name]  Procedure(s) Performed: HARDWARE REMOVAL-Bilateral DVR plate removal, Bilateral wrists (Bilateral: Wrist)  Patient Location: PACU  Anesthesia Type:General  Level of Consciousness: awake, alert  and oriented  Airway & Oxygen Therapy: Patient Spontanous Breathing and Patient connected to face mask oxygen  Post-op Assessment: Report given to RN and Post -op Vital signs reviewed and stable  Post vital signs: Reviewed and stable  Last Vitals:  Vitals Value Taken Time  BP 171/91 12/07/20 1253  Temp    Pulse 79 12/07/20 1254  Resp 19 12/07/20 1255  SpO2 100 % 12/07/20 1254  Vitals shown include unvalidated device data.  Last Pain:  Vitals:   12/07/20 1022  TempSrc: Oral  PainSc: 5          Complications: No notable events documented.

## 2020-12-07 NOTE — Discharge Instructions (Addendum)
Work on finger motion is much as you can.   Keep arms elevated is much as you can throughout the weekend. Pain medicine as directed.   If your fingers swell okay to loosen Ace wrap but leave underlying dressing in place. Local anesthetic was placed during surgery so he may have some numbness in your fingers today and this is normal.  AMBULATORY SURGERY  DISCHARGE INSTRUCTIONS   The drugs that you were given will stay in your system until tomorrow so for the next 24 hours you should not:  Drive an automobile Make any legal decisions Drink any alcoholic beverage   You may resume regular meals tomorrow.  Today it is better to start with liquids and gradually work up to solid foods.  You may eat anything you prefer, but it is better to start with liquids, then soup and crackers, and gradually work up to solid foods.   Please notify your doctor immediately if you have any unusual bleeding, trouble breathing, redness and pain at the surgery site, drainage, fever, or pain not relieved by medication.    Additional Instructions:        Please contact your physician with any problems or Same Day Surgery at [phone], Monday through Friday 6 am to 4 pm, or [place] at [facility] number at [phone].

## 2020-12-07 NOTE — OR Nursing (Signed)
All implanted hardware from bilateral wrists explanted and discarded in biohazard per MD.

## 2020-12-07 NOTE — H&P (Signed)
History of the Present Illness: Kristopher [name] is a 60 y.o. male here today for history and physical for bilateral distal radius DVR plate removal. Patient underwent bilateral wrist ORIF with Dr. Hessie Knows on 07/20/2020. He had comminuted intra-articular wrist fractures to both wrist. Overall symptoms have been improving since surgery but he continues to have pain along the volar aspect of both wrist along the hardware. There is concerned that patient could have long-term damage to his flexor tendons. We discussed risks and benefits of surgical and nonsurgical treatment and elected to go ahead and proceed with surgical intervention to remove both plates to improve patient's pain and also prevent flexor tendon injury in the future.  The patient states he has tingling and numbness in his fingers. He states he had a carpal tunnel 15 years ago when he had an electrocution injury to his wrist. He states he has slowly gotten to where he can use a doorknob. He notes he is on short-term disability.  The patient is not currently working.  I have reviewed past medical, surgical, social and family history, and allergies as documented in the EMR.  Past Medical History: Past Medical History:  Diagnosis Date   Aortic atherosclerosis (CMS-HCC)   Atrial fibrillation (CMS-HCC)   Chronic bronchitis (CMS-HCC)   History of pleural empyema   Hyperlipidemia   Past Surgical History: Past Surgical History:  Procedure Laterality Date   ORIF DISTAL RADIUS FRACTURE Bilateral 07/19/2020   Past Family History: No family history on file.  Medications: Current Outpatient Medications Ordered in Epic  Medication Sig Dispense Refill   albuterol 90 mcg/actuation inhaler 2 puffs q.i.d. p.r.n. short of breath, wheezing, or cough 1 Inhaler 0   atorvastatin (LIPITOR) 20 MG tablet Take 1 tablet by mouth once daily   HYDROcodone-acetaminophen (NORCO) 5-325 mg tablet Take 1 tablet by mouth every 6 (six) hours as needed 30  tablet 0   traMADoL (ULTRAM) 50 mg tablet Take 1 tablet by mouth as directed   No current Epic-ordered facility-administered medications on file.   Allergies: No Known Allergies   There is no height or weight on file to calculate BMI.  Review of Systems: A comprehensive 14 point ROS was performed, reviewed, and the pertinent orthopaedic findings are documented in the HPI.  There were no vitals filed for this visit.   General Physical Examination:   General:  Well developed, well nourished, no apparent distress, normal affect, normal gait with no antalgic component.   HEENT: Head normocephalic, atraumatic, PERRL.   Abdomen: Soft, non tender, non distended, Bowel sounds present.  Heart: Examination of the heart reveals regular, rate, and rhythm. There is no murmur noted on ascultation. There is a normal apical pulse.  Lungs: Lungs are clear to auscultation. There is no wheeze, rhonchi, or crackles. There is normal expansion of bilateral chest walls.   Musculoskeletal examination:   On exam, right wrist has 30 degrees of dorsiflexion and left wrist has 25 degrees of dorsiflexion. Right wrist has 30 degrees of extension and left wrist has 40 degrees of extension. Full supination on the right and 80 degrees of supination on the left. Full pronation bilaterally. Good grip strength. Tenderness to the plate on the right and left near the radial styloid. Patient is able to perform full active flexion and extension of digits of both hands.  Radiographs:  AP, lateral, and oblique x-rays of the bilateral wrists were reviewed by me today. These show comminuted fractures which appear to be entirely  healed. There is some loss of volar tilt on both sides. On the lateral view, there seems to be 2 pegs backing out a little bit on each of these plates. The cortical screws are intact. No evidence of penetration into the joint.  X-ray Impression: Healed fractures with acceptable alignment,  possible backing out of hardware.   Assessment: ICD-10-CM  1. Painful orthopaedic hardware (CMS-HCC) T84.84XA  2. S/P ORIF (open reduction internal fixation) fracture Z98.890  Z87.81   Plan:  75. 60 year old male 4 months out from bilateral distal radius ORIF. He is continuing to have pain along the hardware with possible loosening of hardware with some concerns for future flexor tendon injury. Risks, benefits, complications of bilateral distal radius DVR plate removal have been discussed with the patient. Patient has agreed and consented procedure with Dr. Hessie Knows on 12/07/2020   Electronically signed by Feliberto Gottron, [place] at 12/01/2020 8:56 AM EDT  Reviewed  H+P. No changes noted.

## 2020-12-08 ENCOUNTER — Encounter: Payer: Self-pay | Admitting: Orthopedic Surgery

## 2020-12-12 NOTE — Anesthesia Postprocedure Evaluation (Signed)
Anesthesia Post Note  Patient: Kristopher [name]  Procedure(s) Performed: HARDWARE REMOVAL-Bilateral DVR plate removal, Bilateral wrists (Bilateral: Wrist)  Patient location during evaluation: PACU Anesthesia Type: General Level of consciousness: awake and alert Pain management: pain level controlled Vital Signs Assessment: post-procedure vital signs reviewed and stable Respiratory status: spontaneous breathing, nonlabored ventilation, respiratory function stable and patient connected to nasal cannula oxygen Cardiovascular status: blood pressure returned to baseline and stable Postop Assessment: no apparent nausea or vomiting Anesthetic complications: no   No notable events documented.   Last Vitals:  Vitals:   12/07/20 1405 12/07/20 1421  BP: (!) 151/101 (!) 136/92  Pulse: 81 88  Resp: 16 16  Temp: (!) 36.3 C   SpO2: 97% 98%    Last Pain:  Vitals:   12/08/20 1108  TempSrc:   PainSc: 2                  Molli Barrows

## 2020-12-13 ENCOUNTER — Encounter: Payer: Self-pay | Admitting: Orthopedic Surgery

## 2020-12-19 NOTE — Progress Notes (Signed)
Electrophysiology Office Note:    Date:  12/20/2020   ID:  Kristopher [name], DOB December 28, 1960, MRN [id number]  PCP:  Steele Sizer, MD  [facility] Cardiologist:  None  [facility] Electrophysiologist:  None   Referring MD: Steele Sizer, MD   Chief Complaint: Atrial fibrillation  History of Present Illness:    Kristopher [name] is a 60 y.o. male who presents for an evaluation of atrial fibrillation at the request of Dr. Ancil Boozer. Their medical history includes paroxysmal atrial fibrillation, hypertension, hyperlipidemia.  The patient was last seen by Dr. Ancil Boozer October 13, 2020.  Kristopher [name] tells me that in May of this year he was on the roof working and stood up.  When he stood up he passed out and fell off the roof breaking both wrists.  These have healed but he is still struggling with rib fractures and the pain associated with them.  He actually had the plates removed from bilateral wrist recently by orthopedic surgery.  He tells me that since he was a younger man he has had intermittent palpitations but nothing sustained.  No lightheadedness or dizziness associated with them.  He does not recall having any prodromal symptoms prior to passing out on the roof.  Past Medical History:  Diagnosis Date   Alcohol abuse    Anemia    Dysrhythmia    Electric injury    HLD (hyperlipidemia)    HTN (hypertension)    PAF (paroxysmal atrial fibrillation) (HCC)    a. in the setting of PNA with empyema; b. CHADS2VASc => 0; c. TTE 1/19: EF of 55-60%, unable to exlcude RWMA, mitral valve with systolic bowing without prolapse, mildly dilated RV cavity size with normal RV systolic function, mildly dilated right atrium   PNA (pneumonia)    a. 7/67: complicated by right-sided empyema which was treated with chest tube placement and subsequent bronchoscopy and right thoractomy with decortication and pleural drainage of lung abscess   Thrombocytosis     Past Surgical History:  Procedure Laterality Date    CARPAL TUNNEL RELEASE Left 07/20/2020   Procedure: CARPAL TUNNEL RELEASE;  Surgeon: Hessie Knows, MD;  Location: [facility] ORS;  Service: Orthopedics;  Laterality: Left;   DECORTICATION N/A 04/03/2017   Procedure: DECORTICATION;  Surgeon: Nestor Lewandowsky, MD;  Location: [facility] ORS;  Service: Thoracic;  Laterality: N/A;  debridement of lung abscess   HARDWARE REMOVAL Bilateral 12/07/2020   Procedure: HARDWARE REMOVAL-Bilateral DVR plate removal, Bilateral wrists;  Surgeon: Hessie Knows, MD;  Location: [facility] ORS;  Service: Orthopedics;  Laterality: Bilateral;   OPEN REDUCTION INTERNAL FIXATION (ORIF) DISTAL RADIAL FRACTURE Bilateral 07/20/2020   Procedure: OPEN REDUCTION INTERNAL FIXATION (ORIF) DISTAL RADIAL FRACTURE;  Surgeon: Hessie Knows, MD;  Location: [facility] OR[facility];  Service: Orthopedics;  Laterality: Bilateral;   SKIN GRAFT     THORACOTOMY Right 04/03/2017   Procedure: THORACOTOMY MAJOR;  Surgeon: Nestor Lewandowsky, MD;  Location: [facility] OR[facility];  Service: Thoracic;  Laterality: Right;   VIDEO BRONCHOSCOPY N/A 04/03/2017   Procedure: PREOP BRONCHOSCOPY;  Surgeon: Nestor Lewandowsky, MD;  Location: [facility] ORS;  Service: Thoracic;  Laterality: N/A;    Current Medications: Current Meds  Medication Sig   ibuprofen (ADVIL) 200 MG tablet Take 600-800 mg by mouth every 6 (six) hours as needed for mild pain.   naproxen sodium (ALEVE) 220 MG tablet Take 220 mg by mouth 2 (two) times daily as needed (pain).     Allergies:   Patient has no known allergies.   Social History  Socioeconomic History   Marital status: Single    Spouse name: Not on file   Number of children: Not on file   Years of education: Not on file   Highest education level: Not on file  Occupational History   Not on file  Tobacco Use   Smoking status: Every Day    Packs/day: 1.50    Years: 35.00    Pack years: 52.50    Types: Cigarettes   Smokeless tobacco: Never  Vaping Use   Vaping Use: Never used  Substance and Sexual Activity   Alcohol use:  Yes    Alcohol/week: 1.0 standard drink    Types: 1 Shots of liquor per week    Comment: 42-126 beers (12 oz) per week   Drug use: No   Sexual activity: Not Currently  Other Topics Concern   Not on file  Social History Narrative   Not on file   Social Determinants of Health   Financial Resource Strain: Not on file  Food Insecurity: Not on file  Transportation Needs: No Transportation Needs   Lack of Transportation (Medical): No   Lack of Transportation (Non-Medical): No  Physical Activity: Not on file  Stress: Not on file  Social Connections: Not on file     Family History: The patient's family history includes Atrial fibrillation in his mother; Dementia in his mother.  ROS:   Please see the history of present illness.    All other systems reviewed and are negative.  EKGs/Labs/Other Studies Reviewed:    The following studies were reviewed today:  Prior notes  EKG:  The ekg ordered today demonstrates normal sinus rhythm.  Normal intervals.  No preexcitation.  Recent Labs: 07/20/2020: Magnesium 1.8; TSH 2.664 10/13/2020: ALT 27; BUN 5; Creat 0.67; Hemoglobin 16.4; Platelets 294; Potassium 4.5; Sodium 133  Recent Lipid Panel    Component Value Date/Time   CHOL 195 12/01/2019 1247   TRIG 120 12/01/2019 1247   HDL 104 12/01/2019 1247   CHOLHDL 1.9 12/01/2019 1247   LDLCALC 70 12/01/2019 1247    Physical Exam:    VS:  BP (!) 148/98 (BP Location: Left Arm, Patient Position: Sitting, Cuff Size: Normal)   Pulse 88   Ht 5\' 11"  (1.803 m)   Wt 137 lb (62.1 kg)   SpO2 98%   BMI 19.11 kg/m     Wt Readings from Last 3 Encounters:  12/20/20 137 lb (62.1 kg)  12/07/20 130 lb 1.1 oz (59 kg)  11/27/20 130 lb (59 kg)     GEN:  Well nourished, well developed in no acute distress.  Thin HEENT: Normal NECK: No JVD; No carotid bruits LYMPHATICS: No lymphadenopathy CARDIAC: RRR, no murmurs, rubs, gallops RESPIRATORY:  Clear to auscultation without rales, wheezing or  rhonchi  ABDOMEN: Soft, non-tender, non-distended MUSCULOSKELETAL:  No edema; No deformity  SKIN: Warm and dry NEUROLOGIC:  Alert and oriented x 3 PSYCHIATRIC:  Normal affect   ASSESSMENT:    1. Syncope and collapse   2. Fall, subsequent encounter   3. Elevated blood pressure reading    PLAN:    In order of problems listed above:   #Syncope Resulting in personal injury when he fell from a roof.  He tells me that it was very hot and he was working outside the day it happened.  I think the most likely explanation is dehydration and orthostatic intolerance although I think it would be important to rule out any arrhythmias that could have contributed.  I  we will have him wear a 2-week ZIO monitor to assess for any arrhythmias.  If this shows no evidence of tachyarrhythmias or conduction disease, I would feel more confident telling him that it was related to dehydration.  I encouraged him to stay hydrated while working.  I think if the 2-week ZIO monitor shows no evidence of arrhythmia, he could safely return to driving.   #Elevated blood pressure reading Checks his blood pressures at home and they are consistently less than 140 mmHg.  I would encourage him to continue checking his pressures at home 1-2 times per week.   Follow-up based on the results of ZIO monitor.   Medication Adjustments/Labs and Tests Ordered: Current medicines are reviewed at length with the patient today.  Concerns regarding medicines are outlined above.  Orders Placed This Encounter  Procedures   LONG TERM MONITOR (3-14 DAYS)   EKG 12-Lead   No orders of the defined types were placed in this encounter.    Signed, Hilton Cork. Quentin Ore, MD, [facility], [facility] 12/20/2020 10:43 AM    Electrophysiology [facility]

## 2020-12-20 ENCOUNTER — Other Ambulatory Visit: Payer: Self-pay

## 2020-12-20 ENCOUNTER — Encounter: Payer: Self-pay | Admitting: Cardiology

## 2020-12-20 ENCOUNTER — Ambulatory Visit (INDEPENDENT_AMBULATORY_CARE_PROVIDER_SITE_OTHER): Payer: PRIVATE HEALTH INSURANCE

## 2020-12-20 ENCOUNTER — Ambulatory Visit (INDEPENDENT_AMBULATORY_CARE_PROVIDER_SITE_OTHER): Payer: PRIVATE HEALTH INSURANCE | Admitting: Cardiology

## 2020-12-20 VITALS — BP 148/98 | HR 88 | Ht 71.0 in | Wt 137.0 lb

## 2020-12-20 DIAGNOSIS — R55 Syncope and collapse: Secondary | ICD-10-CM

## 2020-12-20 DIAGNOSIS — R030 Elevated blood-pressure reading, without diagnosis of hypertension: Secondary | ICD-10-CM | POA: Diagnosis not present

## 2020-12-20 DIAGNOSIS — W19XXXD Unspecified fall, subsequent encounter: Secondary | ICD-10-CM

## 2020-12-20 NOTE — Patient Instructions (Addendum)
Medication Instructions:  Your physician recommends that you continue on your current medications as directed. Please refer to the Current Medication list given to you today. *If you need a refill on your cardiac medications before your next appointment, please call your pharmacy*  Lab Work: None ordered. If you have labs (blood work) drawn today and your tests are completely normal, you will receive your results only by: [place] (if you have MyChart) OR A paper copy in the mail If you have any lab test that is abnormal or we need to change your treatment, we will call you to review the results.  Testing/Procedures: Your physician has recommended that you wear a holter monitor. Holter monitors are medical devices that record the heart's electrical activity. Doctors most often use these monitors to diagnose arrhythmias. Arrhythmias are problems with the speed or rhythm of the heartbeat. The monitor is a small, portable device. You can wear one while you do your normal daily activities. This is usually used to diagnose what is causing palpitations/syncope (passing out).  You will wear a 14 day ZIO monitor  Follow-Up: At [facility], you and your health needs are our priority.  As part of our continuing mission to provide you with exceptional heart care, we have created designated Provider Care Teams.  These Care Teams include your primary Cardiologist (physician) and Advanced Practice Providers (APPs -  Physician Assistants and Nurse Practitioners) who all work together to provide you with the care you need, when you need it.  Your next appointment:   Your physician wants you to follow-up based on results of heart monitor   Your physician has recommended that you wear a Zio monitor.   This monitor is a medical device that records the heart's electrical activity. Doctors most often use these monitors to diagnose arrhythmias. Arrhythmias are problems with the speed or rhythm of the  heartbeat. The monitor is a small device applied to your chest. You can wear one while you do your normal daily activities. While wearing this monitor if you have any symptoms to push the button and record what you felt. Once you have worn this monitor for the period of time provider prescribed (Usually 14 days), you will return the monitor device in the postage paid box. Once it is returned they will download the data collected and provide [place] with a report which the provider will then review and we will call you with those results. Important tips:  Avoid showering during the first 24 hours of wearing the monitor. Avoid excessive sweating to help maximize wear time. Do not submerge the device, no hot tubs, and no swimming pools. Keep any lotions or oils away from the patch. After 24 hours you may shower with the patch on. Take brief showers with your back facing the shower head.  Do not remove patch once it has been placed because that will interrupt data and decrease adhesive wear time. Push the button when you have any symptoms and write down what you were feeling. Once you have completed wearing your monitor, remove and place into box which has postage paid and place in your outgoing mailbox.  If for some reason you have misplaced your box then call our office and we can provide another box and/or mail it off for you.

## 2021-05-08 ENCOUNTER — Telehealth: Payer: Self-pay

## 2021-05-08 NOTE — Telephone Encounter (Signed)
Pt daughter said that they he does not have insurance and that is why he has not been. 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

## 2021-05-08 NOTE — Telephone Encounter (Signed)
Copied from [place] [id number]. Topic: General - Inquiry >> May 08, 2021 10:11 AM Greggory Keen D wrote: Reason for CRM: Pt's daughter Kristopher [name] called asking if Dr. Ancil Boozer would write a letter of medical necessity to cover an air purifier for their home.  Also she is asking if she would put him back on Blood pressure med.  I told her he would need an appt probably but she did not wan to make an appt because he does not have insurance.  CB#  [phone]

## 2021-05-09 NOTE — Telephone Encounter (Signed)
Called and spoke with Estill Bamberg and gave her the phone # and the address for [facility]

## 2022-05-23 DIAGNOSIS — R0602 Shortness of breath: Secondary | ICD-10-CM | POA: Diagnosis not present

## 2022-05-23 DIAGNOSIS — Z131 Encounter for screening for diabetes mellitus: Secondary | ICD-10-CM | POA: Diagnosis not present

## 2022-05-23 DIAGNOSIS — I2510 Atherosclerotic heart disease of native coronary artery without angina pectoris: Secondary | ICD-10-CM | POA: Diagnosis not present

## 2022-05-23 DIAGNOSIS — I480 Paroxysmal atrial fibrillation: Secondary | ICD-10-CM | POA: Diagnosis not present

## 2022-05-23 DIAGNOSIS — E785 Hyperlipidemia, unspecified: Secondary | ICD-10-CM | POA: Diagnosis not present

## 2022-05-23 DIAGNOSIS — Z0000 Encounter for general adult medical examination without abnormal findings: Secondary | ICD-10-CM | POA: Diagnosis not present

## 2022-05-23 DIAGNOSIS — Z130 Encounter for screening for diseases of the blood and blood-forming organs and certain disorders involving the immune mechanism: Secondary | ICD-10-CM | POA: Diagnosis not present

## 2022-05-23 DIAGNOSIS — I77819 Aortic ectasia, unspecified site: Secondary | ICD-10-CM | POA: Diagnosis not present

## 2022-05-23 DIAGNOSIS — I700 Atherosclerosis of aorta: Secondary | ICD-10-CM | POA: Diagnosis not present

## 2022-05-23 DIAGNOSIS — I10 Essential (primary) hypertension: Secondary | ICD-10-CM | POA: Diagnosis not present

## 2022-05-23 DIAGNOSIS — R636 Underweight: Secondary | ICD-10-CM | POA: Diagnosis not present

## 2022-05-28 DIAGNOSIS — R0602 Shortness of breath: Secondary | ICD-10-CM | POA: Diagnosis not present

## 2022-06-06 DIAGNOSIS — I10 Essential (primary) hypertension: Secondary | ICD-10-CM | POA: Diagnosis not present

## 2022-06-06 DIAGNOSIS — Z712 Person consulting for explanation of examination or test findings: Secondary | ICD-10-CM | POA: Diagnosis not present

## 2022-06-06 DIAGNOSIS — R7989 Other specified abnormal findings of blood chemistry: Secondary | ICD-10-CM | POA: Diagnosis not present

## 2022-06-06 DIAGNOSIS — R899 Unspecified abnormal finding in specimens from other organs, systems and tissues: Secondary | ICD-10-CM | POA: Diagnosis not present

## 2022-06-06 DIAGNOSIS — Z09 Encounter for follow-up examination after completed treatment for conditions other than malignant neoplasm: Secondary | ICD-10-CM | POA: Diagnosis not present

## 2022-06-06 DIAGNOSIS — R1084 Generalized abdominal pain: Secondary | ICD-10-CM | POA: Diagnosis not present

## 2022-06-06 DIAGNOSIS — I77810 Thoracic aortic ectasia: Secondary | ICD-10-CM | POA: Diagnosis not present

## 2022-06-06 DIAGNOSIS — R195 Other fecal abnormalities: Secondary | ICD-10-CM | POA: Diagnosis not present

## 2022-06-12 DIAGNOSIS — I77819 Aortic ectasia, unspecified site: Secondary | ICD-10-CM | POA: Diagnosis not present

## 2022-06-12 DIAGNOSIS — R0609 Other forms of dyspnea: Secondary | ICD-10-CM | POA: Diagnosis not present

## 2022-06-12 DIAGNOSIS — E7849 Other hyperlipidemia: Secondary | ICD-10-CM | POA: Diagnosis not present

## 2022-06-12 DIAGNOSIS — I700 Atherosclerosis of aorta: Secondary | ICD-10-CM | POA: Diagnosis not present

## 2022-06-12 DIAGNOSIS — I10 Essential (primary) hypertension: Secondary | ICD-10-CM | POA: Diagnosis not present

## 2022-06-12 DIAGNOSIS — I480 Paroxysmal atrial fibrillation: Secondary | ICD-10-CM | POA: Diagnosis not present

## 2022-06-12 DIAGNOSIS — I2510 Atherosclerotic heart disease of native coronary artery without angina pectoris: Secondary | ICD-10-CM | POA: Diagnosis not present

## 2022-10-11 DIAGNOSIS — R7989 Other specified abnormal findings of blood chemistry: Secondary | ICD-10-CM | POA: Diagnosis not present

## 2022-10-11 DIAGNOSIS — I712 Thoracic aortic aneurysm, without rupture, unspecified: Secondary | ICD-10-CM | POA: Diagnosis not present

## 2022-10-11 DIAGNOSIS — R1084 Generalized abdominal pain: Secondary | ICD-10-CM | POA: Diagnosis not present

## 2022-10-11 DIAGNOSIS — I77810 Thoracic aortic ectasia: Secondary | ICD-10-CM | POA: Diagnosis not present

## 2022-10-11 DIAGNOSIS — R932 Abnormal findings on diagnostic imaging of liver and biliary tract: Secondary | ICD-10-CM | POA: Diagnosis not present

## 2022-10-21 DIAGNOSIS — K769 Liver disease, unspecified: Secondary | ICD-10-CM | POA: Diagnosis not present

## 2022-10-21 DIAGNOSIS — R0602 Shortness of breath: Secondary | ICD-10-CM | POA: Diagnosis not present

## 2022-10-21 DIAGNOSIS — Z712 Person consulting for explanation of examination or test findings: Secondary | ICD-10-CM | POA: Diagnosis not present

## 2022-10-21 DIAGNOSIS — G8929 Other chronic pain: Secondary | ICD-10-CM | POA: Diagnosis not present

## 2022-10-21 DIAGNOSIS — E440 Moderate protein-calorie malnutrition: Secondary | ICD-10-CM | POA: Diagnosis not present

## 2022-12-13 DIAGNOSIS — E7849 Other hyperlipidemia: Secondary | ICD-10-CM | POA: Diagnosis not present

## 2022-12-13 DIAGNOSIS — I2510 Atherosclerotic heart disease of native coronary artery without angina pectoris: Secondary | ICD-10-CM | POA: Diagnosis not present

## 2022-12-13 DIAGNOSIS — I480 Paroxysmal atrial fibrillation: Secondary | ICD-10-CM | POA: Diagnosis not present

## 2022-12-13 DIAGNOSIS — I77819 Aortic ectasia, unspecified site: Secondary | ICD-10-CM | POA: Diagnosis not present

## 2022-12-13 DIAGNOSIS — I700 Atherosclerosis of aorta: Secondary | ICD-10-CM | POA: Diagnosis not present

## 2022-12-13 DIAGNOSIS — F17210 Nicotine dependence, cigarettes, uncomplicated: Secondary | ICD-10-CM | POA: Diagnosis not present

## 2022-12-13 DIAGNOSIS — R0609 Other forms of dyspnea: Secondary | ICD-10-CM | POA: Diagnosis not present

## 2023-01-06 DIAGNOSIS — I480 Paroxysmal atrial fibrillation: Secondary | ICD-10-CM | POA: Diagnosis not present

## 2023-01-06 DIAGNOSIS — I77819 Aortic ectasia, unspecified site: Secondary | ICD-10-CM | POA: Diagnosis not present

## 2023-01-06 DIAGNOSIS — I700 Atherosclerosis of aorta: Secondary | ICD-10-CM | POA: Diagnosis not present

## 2023-01-06 DIAGNOSIS — I2510 Atherosclerotic heart disease of native coronary artery without angina pectoris: Secondary | ICD-10-CM | POA: Diagnosis not present

## 2023-01-06 DIAGNOSIS — I10 Essential (primary) hypertension: Secondary | ICD-10-CM | POA: Diagnosis not present

## 2023-09-24 DIAGNOSIS — M6740 Ganglion, unspecified site: Secondary | ICD-10-CM | POA: Diagnosis not present

## 2023-11-11 DIAGNOSIS — S6291XD Unspecified fracture of right wrist and hand, subsequent encounter for fracture with routine healing: Secondary | ICD-10-CM | POA: Diagnosis not present

## 2023-11-11 DIAGNOSIS — R2231 Localized swelling, mass and lump, right upper limb: Secondary | ICD-10-CM | POA: Diagnosis not present

## 2023-12-12 DIAGNOSIS — I480 Paroxysmal atrial fibrillation: Secondary | ICD-10-CM | POA: Diagnosis not present

## 2023-12-12 DIAGNOSIS — I77819 Aortic ectasia, unspecified site: Secondary | ICD-10-CM | POA: Diagnosis not present

## 2023-12-12 DIAGNOSIS — I2510 Atherosclerotic heart disease of native coronary artery without angina pectoris: Secondary | ICD-10-CM | POA: Diagnosis not present

## 2023-12-12 DIAGNOSIS — I700 Atherosclerosis of aorta: Secondary | ICD-10-CM | POA: Diagnosis not present

## 2023-12-12 DIAGNOSIS — E7849 Other hyperlipidemia: Secondary | ICD-10-CM | POA: Diagnosis not present

## 2023-12-12 DIAGNOSIS — I10 Essential (primary) hypertension: Secondary | ICD-10-CM | POA: Diagnosis not present
